# Patient Record
Sex: Female | Born: 1970
Health system: Southern US, Community
[De-identification: ages and names within clinical notes are randomized; demographics above are authoritative.]

## PROBLEM LIST (undated history)

## (undated) DIAGNOSIS — R413 Other amnesia: Secondary | ICD-10-CM

## (undated) DIAGNOSIS — G43909 Migraine, unspecified, not intractable, without status migrainosus: Secondary | ICD-10-CM

## (undated) DIAGNOSIS — R519 Headache, unspecified: Secondary | ICD-10-CM

## (undated) DIAGNOSIS — I10 Essential (primary) hypertension: Secondary | ICD-10-CM

## (undated) DIAGNOSIS — R51 Headache: Secondary | ICD-10-CM

## (undated) DIAGNOSIS — J189 Pneumonia, unspecified organism: Secondary | ICD-10-CM

## (undated) DIAGNOSIS — E7800 Pure hypercholesterolemia, unspecified: Secondary | ICD-10-CM

## (undated) DIAGNOSIS — D573 Sickle-cell trait: Secondary | ICD-10-CM

## (undated) DIAGNOSIS — I639 Cerebral infarction, unspecified: Secondary | ICD-10-CM

## (undated) DIAGNOSIS — N189 Chronic kidney disease, unspecified: Secondary | ICD-10-CM

## (undated) DIAGNOSIS — D649 Anemia, unspecified: Secondary | ICD-10-CM

## (undated) DIAGNOSIS — R319 Hematuria, unspecified: Secondary | ICD-10-CM

## (undated) DIAGNOSIS — Z9289 Personal history of other medical treatment: Secondary | ICD-10-CM

## (undated) DIAGNOSIS — E119 Type 2 diabetes mellitus without complications: Secondary | ICD-10-CM

## (undated) HISTORY — PX: TUBAL LIGATION: SHX77

## (undated) HISTORY — DX: Chronic kidney disease, unspecified: N18.9

---

## 2008-09-20 DIAGNOSIS — I639 Cerebral infarction, unspecified: Secondary | ICD-10-CM

## 2008-09-20 HISTORY — DX: Cerebral infarction, unspecified: I63.9

## 2014-03-21 ENCOUNTER — Emergency Department (HOSPITAL_BASED_OUTPATIENT_CLINIC_OR_DEPARTMENT_OTHER)
Admission: EM | Admit: 2014-03-21 | Discharge: 2014-03-22 | Disposition: A | Discharge status: Home / Self Care | Payer: Medicare PPO | Attending: Emergency Medicine | Admitting: Emergency Medicine

## 2014-03-21 ENCOUNTER — Encounter (HOSPITAL_BASED_OUTPATIENT_CLINIC_OR_DEPARTMENT_OTHER): Payer: Self-pay | Admitting: Emergency Medicine

## 2014-03-21 DIAGNOSIS — I10 Essential (primary) hypertension: Secondary | ICD-10-CM | POA: Insufficient documentation

## 2014-03-21 DIAGNOSIS — Z791 Long term (current) use of non-steroidal anti-inflammatories (NSAID): Secondary | ICD-10-CM | POA: Insufficient documentation

## 2014-03-21 DIAGNOSIS — Z8673 Personal history of transient ischemic attack (TIA), and cerebral infarction without residual deficits: Secondary | ICD-10-CM | POA: Insufficient documentation

## 2014-03-21 DIAGNOSIS — Z79899 Other long term (current) drug therapy: Secondary | ICD-10-CM | POA: Insufficient documentation

## 2014-03-21 DIAGNOSIS — N289 Disorder of kidney and ureter, unspecified: Secondary | ICD-10-CM | POA: Insufficient documentation

## 2014-03-21 DIAGNOSIS — R51 Headache: Secondary | ICD-10-CM | POA: Insufficient documentation

## 2014-03-21 DIAGNOSIS — E119 Type 2 diabetes mellitus without complications: Secondary | ICD-10-CM | POA: Insufficient documentation

## 2014-03-21 DIAGNOSIS — R519 Headache, unspecified: Secondary | ICD-10-CM

## 2014-03-21 HISTORY — DX: Cerebral infarction, unspecified: I63.9

## 2014-03-21 HISTORY — DX: Headache: R51

## 2014-03-21 HISTORY — DX: Essential (primary) hypertension: I10

## 2014-03-21 HISTORY — DX: Headache, unspecified: R51.9

## 2014-03-21 LAB — BASIC METABOLIC PANEL
Anion gap: 16 — ABNORMAL HIGH (ref 5–15)
BUN: 29 mg/dL — ABNORMAL HIGH (ref 6–23)
CO2: 25 mEq/L (ref 19–32)
Calcium: 9.8 mg/dL (ref 8.4–10.5)
Chloride: 99 mEq/L (ref 96–112)
Creatinine, Ser: 1.9 mg/dL — ABNORMAL HIGH (ref 0.50–1.10)
GFR calc Af Amer: 36 mL/min — ABNORMAL LOW (ref >90)
GFR calc non Af Amer: 31 mL/min — ABNORMAL LOW (ref >90)
Glucose, Bld: 82 mg/dL (ref 70–99)
Potassium: 3.3 mEq/L — ABNORMAL LOW (ref 3.7–5.3)
Sodium: 140 mEq/L (ref 137–147)

## 2014-03-21 MED ORDER — CLONIDINE HCL 0.1 MG PO TABS
0.1000 mg | ORAL_TABLET | Freq: Once | ORAL | Status: AC
  Administered 2014-03-21: 0.1 mg via ORAL
  Filled 2014-03-21: qty 1

## 2014-03-21 MED ORDER — HYDROCODONE-ACETAMINOPHEN 5-325 MG PO TABS
2.0000 | ORAL_TABLET | Freq: Once | ORAL | Status: AC
  Administered 2014-03-21: 2 via ORAL
  Filled 2014-03-21: qty 2

## 2014-03-21 MED ORDER — KETOROLAC TROMETHAMINE 30 MG/ML IJ SOLN
30.0000 mg | Freq: Once | INTRAMUSCULAR | Status: AC
  Administered 2014-03-21: 30 mg via INTRAVENOUS
  Filled 2014-03-21: qty 1

## 2014-03-21 MED ORDER — SODIUM CHLORIDE 0.9 % IV BOLUS (SEPSIS)
1000.0000 mL | Freq: Once | INTRAVENOUS | Status: AC
  Administered 2014-03-21: 1000 mL via INTRAVENOUS

## 2014-03-21 MED ORDER — DIPHENHYDRAMINE HCL 50 MG/ML IJ SOLN
12.5000 mg | Freq: Once | INTRAMUSCULAR | Status: AC
  Administered 2014-03-21: 12.5 mg via INTRAVENOUS
  Filled 2014-03-21: qty 1

## 2014-03-21 MED ORDER — METOCLOPRAMIDE HCL 5 MG/ML IJ SOLN
10.0000 mg | Freq: Once | INTRAMUSCULAR | Status: AC
  Administered 2014-03-21: 10 mg via INTRAVENOUS
  Filled 2014-03-21: qty 2

## 2014-03-21 NOTE — ED Provider Notes (Signed)
CSN: [id number]     Arrival date & time 03/21/14  1542 History   First MD Initiated Contact with Patient 03/21/14 1601     Chief Complaint  Patient presents with  . Headache     (Consider location/radiation/quality/duration/timing/severity/associated sxs/prior Treatment) HPI Comments: Pt states that she has an ongoing history of headache with htn. Pt states that this has been ongoing for a couple of years. Pt states that her pcp has been altering her bp medication to help with the headaches. Pt states that he was giving her narcotics for the headache, but has stopped given her narcotics. Pt denies fever, numbness, weakness, blurred vision, neck pain. She tried tylenol at home without relief. No n/v.  The history is provided by the patient. No language interpreter was used.    Past Medical History  Diagnosis Date  . Frequent headaches   . Stroke   . Hypertension   . Diabetes mellitus without complication    Past Surgical History  Procedure Laterality Date  . Cesarean section     No family history on file. History  Substance Use Topics  . Smoking status: Never Smoker   . Smokeless tobacco: Not on file  . Alcohol Use: No   OB History   Grav Para Term Preterm Abortions TAB SAB Ect Mult Living                 Review of Systems  Constitutional: Negative.   Respiratory: Negative.   Cardiovascular: Negative.       Allergies  Levaquin  Home Medications   Prior to Admission medications   Medication Sig Start Date End Date Taking? Authorizing Provider  benazepril (LOTENSIN) 20 MG tablet Take 20 mg by mouth daily.   Yes Historical Provider, MD  chlorthalidone (HYGROTON) 25 MG tablet Take 25 mg by mouth daily.   Yes Historical Provider, MD  diclofenac (CATAFLAM) 50 MG tablet Take 50 mg by mouth 3 (three) times daily.   Yes Historical Provider, MD  labetalol (NORMODYNE) 100 MG tablet Take 100 mg by mouth 2 (two) times daily.   Yes Historical Provider, MD  metFORMIN  (GLUCOPHAGE) 500 MG tablet Take by mouth 2 (two) times daily with a meal.   Yes Historical Provider, MD   BP 204/105  Pulse 81  Temp(Src) 98.1 F (36.7 C) (Oral)  Resp 20  Ht 5\' 2"  (1.575 m)  Wt 155 lb (70.308 kg)  BMI 28.34 kg/m2  SpO2 99%  LMP 03/06/2014 Physical Exam  Nursing note and vitals reviewed. Constitutional: She is oriented to person, place, and time. She appears well-developed and well-nourished.  HENT:  Head: Normocephalic and atraumatic.  Eyes: Conjunctivae and EOM are normal. Pupils are equal, round, and reactive to light.  Neck: Normal range of motion. Neck supple.  Cardiovascular: Normal rate and regular rhythm.   Abdominal: Soft. Bowel sounds are normal.  Musculoskeletal: Normal range of motion.  Grip mildly less on the left consistent with previous stroke  Neurological: She is alert and oriented to person, place, and time. She exhibits normal muscle tone. Coordination normal.  Skin: Skin is warm and dry.    ED Course  Procedures (including critical care time) Labs Review Labs Reviewed  BASIC METABOLIC PANEL - Abnormal; Notable for the following:    Potassium 3.3 (*)    BUN 29 (*)    Creatinine, Ser 1.90 (*)    GFR calc non Af Amer 31 (*)    GFR calc Af Amer 36 (*)  Anion gap 16 (*)    All other components within normal limits    Imaging Review No results found.   EKG Interpretation   Date/Time:  Thursday March 21 2014 16:14:45 EDT Ventricular Rate:  73 PR Interval:  156 QRS Duration: 94 QT Interval:  416 QTC Calculation: 458 R Axis:   45 Text Interpretation:  Normal sinus rhythm Nonspecific T wave abnormality  Abnormal ECG No old tracing to compare Confirmed by [facility] [place]  MD, TREY  ([id number]) on 03/21/2014 4:45:16 PM      MDM   Final diagnoses:  Essential hypertension  Headache, unspecified headache type  Renal insufficiency    Daughter states that this has been her cr level in the past and it has been going up and down over the last  year. Pt is going to have it rechecked. Pt is neurologically intact and can follow up with pcp. Pt is feeling better at this time and blood pressure is better. Discussed not sending home on narcotics and she can follow up with pcp    Glendell Docker, NP 03/21/14 2138

## 2014-03-21 NOTE — ED Notes (Signed)
NP made aware of Pt. Reports she is still in pain 8/10.

## 2014-03-21 NOTE — ED Notes (Signed)
Headaches for 2 days.

## 2014-03-21 NOTE — ED Notes (Signed)
Family at bedside. 

## 2014-03-21 NOTE — ED Notes (Signed)
Pt. Walks steady gait to the restroom with no difficulty.  Pt. Is alert and oriented with no distress noted.

## 2014-03-21 NOTE — Discharge Instructions (Signed)
Hypertension Hypertension, commonly called high blood pressure, is when the force of blood pumping through your arteries is too strong. Your arteries are the blood vessels that carry blood from your heart throughout your body. A blood pressure reading consists of a higher number over a lower number, such as 110/72. The higher number (systolic) is the pressure inside your arteries when your heart pumps. The lower number (diastolic) is the pressure inside your arteries when your heart relaxes. Ideally you want your blood pressure below 120/80. Hypertension forces your heart to work harder to pump blood. Your arteries may become narrow or stiff. Having hypertension puts you at risk for heart disease, stroke, and other problems.  RISK FACTORS Some risk factors for high blood pressure are controllable. Others are not.  Risk factors you cannot control include:   Race. You may be at higher risk if you are African American.  Age. Risk increases with age.  Gender. Men are at higher risk than women before age 45 years. After age 65, women are at higher risk than men. Risk factors you can control include:  Not getting enough exercise or physical activity.  Being overweight.  Getting too much fat, sugar, calories, or salt in your diet.  Drinking too much alcohol. SIGNS AND SYMPTOMS Hypertension does not usually cause signs or symptoms. Extremely high blood pressure (hypertensive crisis) may cause headache, anxiety, shortness of breath, and nosebleed. DIAGNOSIS  To check if you have hypertension, your health care provider will measure your blood pressure while you are seated, with your arm held at the level of your heart. It should be measured at least twice using the same arm. Certain conditions can cause a difference in blood pressure between your right and left arms. A blood pressure reading that is higher than normal on one occasion does not mean that you need treatment. If one blood pressure reading  is high, ask your health care provider about having it checked again. TREATMENT  Treating high blood pressure includes making lifestyle changes and possibly taking medication. Living a healthy lifestyle can help lower high blood pressure. You may need to change some of your habits. Lifestyle changes may include:  Following the DASH diet. This diet is high in fruits, vegetables, and whole grains. It is low in salt, red meat, and added sugars.  Getting at least 2 1/2 hours of brisk physical activity every week.  Losing weight if necessary.  Not smoking.  Limiting alcoholic beverages.  Learning ways to reduce stress. If lifestyle changes are not enough to get your blood pressure under control, your health care provider may prescribe medicine. You may need to take more than one. Work closely with your health care provider to understand the risks and benefits. HOME CARE INSTRUCTIONS  Have your blood pressure rechecked as directed by your health care provider.   Only take medicine as directed by your health care provider. Follow the directions carefully. Blood pressure medicines must be taken as prescribed. The medicine does not work as well when you skip doses. Skipping doses also puts you at risk for problems.   Do not smoke.   Monitor your blood pressure at home as directed by your health care provider. SEEK MEDICAL CARE IF:   You think you are having a reaction to medicines taken.  You have recurrent headaches or feel dizzy.  You have swelling in your ankles.  You have trouble with your vision. SEEK IMMEDIATE MEDICAL CARE IF:  You develop a severe headache or   confusion.  You have unusual weakness, numbness, or feel faint.  You have severe chest or abdominal pain.  You vomit repeatedly.  You have trouble breathing. MAKE SURE YOU:   Understand these instructions.  Will watch your condition.  Will get help right away if you are not doing well or get  worse. Document Released: 09/06/2005 Document Revised: 09/11/2013 Document Reviewed: 06/29/2013 [employer] Patient Information 2015 [employer]. This information is not intended to replace advice given to you by your health care provider. Make sure you discuss any questions you have with your health care provider.  

## 2014-03-22 NOTE — ED Provider Notes (Signed)
Medical screening examination/treatment/procedure(s) were performed by non-physician practitioner and as supervising physician I was immediately available for consultation/collaboration.   EKG Interpretation   Date/Time:  Thursday March 21 2014 16:14:45 EDT Ventricular Rate:  73 PR Interval:  156 QRS Duration: 94 QT Interval:  416 QTC Calculation: 458 R Axis:   45 Text Interpretation:  Normal sinus rhythm Nonspecific T wave abnormality  Abnormal ECG No old tracing to compare Confirmed by [facility]  MD, TREY  [phone]) on 03/21/2014 4:45:16 PM        Julie Reichmann Elba Barman III, MD 03/22/14 [phone]

## 2014-05-19 ENCOUNTER — Encounter (HOSPITAL_COMMUNITY): Payer: Self-pay | Admitting: Emergency Medicine

## 2014-05-19 ENCOUNTER — Emergency Department (HOSPITAL_COMMUNITY)
Admission: EM | Admit: 2014-05-19 | Discharge: 2014-05-19 | Disposition: A | Discharge status: Home / Self Care | Payer: Medicare PPO | Attending: Emergency Medicine | Admitting: Emergency Medicine

## 2014-05-19 DIAGNOSIS — G43809 Other migraine, not intractable, without status migrainosus: Secondary | ICD-10-CM

## 2014-05-19 DIAGNOSIS — I10 Essential (primary) hypertension: Secondary | ICD-10-CM | POA: Diagnosis not present

## 2014-05-19 DIAGNOSIS — Z79899 Other long term (current) drug therapy: Secondary | ICD-10-CM | POA: Insufficient documentation

## 2014-05-19 DIAGNOSIS — G43909 Migraine, unspecified, not intractable, without status migrainosus: Secondary | ICD-10-CM | POA: Insufficient documentation

## 2014-05-19 DIAGNOSIS — Z862 Personal history of diseases of the blood and blood-forming organs and certain disorders involving the immune mechanism: Secondary | ICD-10-CM | POA: Diagnosis not present

## 2014-05-19 DIAGNOSIS — E119 Type 2 diabetes mellitus without complications: Secondary | ICD-10-CM | POA: Insufficient documentation

## 2014-05-19 HISTORY — DX: Migraine, unspecified, not intractable, without status migrainosus: G43.909

## 2014-05-19 MED ORDER — BUPIVACAINE HCL 0.25 % IJ SOLN
10.0000 mL | Freq: Once | INTRAMUSCULAR | Status: DC
  Filled 2014-05-19: qty 10

## 2014-05-19 MED ORDER — DIPHENHYDRAMINE HCL 50 MG/ML IJ SOLN
25.0000 mg | Freq: Once | INTRAMUSCULAR | Status: AC
  Administered 2014-05-19: 25 mg via INTRAMUSCULAR
  Filled 2014-05-19: qty 1

## 2014-05-19 MED ORDER — BUTALBITAL-APAP-CAFFEINE 50-325-40 MG PO TABS
2.0000 | ORAL_TABLET | Freq: Once | ORAL | Status: AC
  Administered 2014-05-19: 2 via ORAL
  Filled 2014-05-19: qty 2

## 2014-05-19 MED ORDER — BUPIVACAINE HCL 0.25 % IJ SOLN
30.0000 mL | Freq: Once | INTRAMUSCULAR | Status: DC
  Filled 2014-05-19: qty 30

## 2014-05-19 MED ORDER — METOCLOPRAMIDE HCL 5 MG/ML IJ SOLN
10.0000 mg | Freq: Once | INTRAMUSCULAR | Status: AC
  Administered 2014-05-19: 10 mg via INTRAMUSCULAR
  Filled 2014-05-19: qty 2

## 2014-05-19 MED ORDER — KETOROLAC TROMETHAMINE 30 MG/ML IJ SOLN
60.0000 mg | Freq: Once | INTRAMUSCULAR | Status: AC
  Administered 2014-05-19: 60 mg via INTRAMUSCULAR
  Filled 2014-05-19: qty 2

## 2014-05-19 NOTE — Discharge Instructions (Signed)
Please follow up with your primary care physician in 1-2 days. If you do not have one please call the [facility] number listed above. Please follow up with your neurologist to schedule a follow up appointment.  Please read all discharge instructions and return precautions.    Migraine Headache A migraine headache is an intense, throbbing pain on one or both sides of your head. A migraine can last for 30 minutes to several hours. CAUSES  The exact cause of a migraine headache is not always known. However, a migraine may be caused when nerves in the brain become irritated and release chemicals that cause inflammation. This causes pain. Certain things may also trigger migraines, such as:  Alcohol.  Smoking.  Stress.  Menstruation.  Aged cheeses.  Foods or drinks that contain nitrates, glutamate, aspartame, or tyramine.  Lack of sleep.  Chocolate.  Caffeine.  Hunger.  Physical exertion.  Fatigue.  Medicines used to treat chest pain (nitroglycerine), birth control pills, estrogen, and some blood pressure medicines. SIGNS AND SYMPTOMS  Pain on one or both sides of your head.  Pulsating or throbbing pain.  Severe pain that prevents daily activities.  Pain that is aggravated by any physical activity.  Nausea, vomiting, or both.  Dizziness.  Pain with exposure to bright lights, loud noises, or activity.  General sensitivity to bright lights, loud noises, or smells. Before you get a migraine, you may get warning signs that a migraine is coming (aura). An aura may include:  Seeing flashing lights.  Seeing bright spots, halos, or zigzag lines.  Having tunnel vision or blurred vision.  Having feelings of numbness or tingling.  Having trouble talking.  Having muscle weakness. DIAGNOSIS  A migraine headache is often diagnosed based on:  Symptoms.  Physical exam.  A CT scan or MRI of your head. These imaging tests cannot diagnose migraines,  but they can help rule out other causes of headaches. TREATMENT Medicines may be given for pain and nausea. Medicines can also be given to help prevent recurrent migraines.  HOME CARE INSTRUCTIONS  Only take over-the-counter or prescription medicines for pain or discomfort as directed by your health care provider. The use of long-term narcotics is not recommended.  Lie down in a dark, quiet room when you have a migraine.  Keep a journal to find out what may trigger your migraine headaches. For example, write down:  What you eat and drink.  How much sleep you get.  Any change to your diet or medicines.  Limit alcohol consumption.  Quit smoking if you smoke.  Get 7-9 hours of sleep, or as recommended by your health care provider.  Limit stress.  Keep lights dim if bright lights bother you and make your migraines worse. SEEK IMMEDIATE MEDICAL CARE IF:   Your migraine becomes severe.  You have a fever.  You have a stiff neck.  You have vision loss.  You have muscular weakness or loss of muscle control.  You start losing your balance or have trouble walking.  You feel faint or pass out.  You have severe symptoms that are different from your first symptoms. MAKE SURE YOU:   Understand these instructions.  Will watch your condition.  Will get help right away if you are not doing well or get worse. Document Released: 09/06/2005 Document Revised: 01/21/2014 Document Reviewed: 05/14/2013 [facility] Patient Information 2015 [place], [place]. This information is not intended to replace advice given to you by your health care provider. Make  sure you discuss any questions you have with your health care provider.

## 2014-05-19 NOTE — ED Provider Notes (Signed)
CSN: [id number]     Arrival date & time 05/19/14  1126 History   First MD Initiated Contact with Patient 05/19/14 1248     Chief Complaint  Patient presents with  . Migraine     (Consider location/radiation/quality/duration/timing/severity/associated sxs/prior Treatment) HPI Comments: Patient is a 43 year old female past medical history significant for migraines, hypertension, sickle cell anemia, DM presenting to the emergency department for 2 days of gradually worsening migraine headache with associated photophobia. Patient states she has chronic migraine headaches they're typically well managed with her Topamax and Imitrex, states the last 2 days she's had no relief from his prescription medications. She has tried Tylenol with no improvement. Patient states prior to being placed on Imitrex and Topamax she has had similar headaches to the one today. Denies any fevers, chills, neck stiffness, nausea, vomiting, visual disturbances, syncope.   Patient is a 43 y.o. female presenting with migraines.  Migraine Associated symptoms include headaches.    Past Medical History  Diagnosis Date  . Migraine   . Hypertension   . Sickle cell anemia   . Diabetes mellitus without complication    History reviewed. No pertinent past surgical history. History reviewed. No pertinent family history. History  Substance Use Topics  . Smoking status: Never Smoker   . Smokeless tobacco: Not on file  . Alcohol Use: No   OB History   Grav Para Term Preterm Abortions TAB SAB Ect Mult Living                 Review of Systems  Eyes: Positive for photophobia.  Neurological: Positive for headaches.  All other systems reviewed and are negative.     Allergies  Levaquin  Home Medications   Prior to Admission medications   Medication Sig Start Date End Date Taking? Authorizing Provider  acetaminophen (TYLENOL) 500 MG tablet Take 1,000 mg by mouth every 6 (six) hours as needed for mild pain or  headache.   Yes Historical Provider, MD  amLODipine (NORVASC) 10 MG tablet Take 10 mg by mouth daily.  05/10/14  Yes Historical Provider, MD  B Complex-C-Folic Acid (FOLBEE PLUS PO) Take 1 tablet by mouth daily.   Yes Historical Provider, MD  hydrALAZINE (APRESOLINE) 100 MG tablet Take 100 mg by mouth 2 (two) times daily.  05/10/14  Yes Historical Provider, MD  labetalol (NORMODYNE) 100 MG tablet Take 100 mg by mouth 2 (two) times daily.  04/22/14  Yes Historical Provider, MD  pravastatin (PRAVACHOL) 20 MG tablet Take 20 mg by mouth daily.  05/10/14  Yes Historical Provider, MD  SUMAtriptan (IMITREX) 25 MG tablet Take 25 mg by mouth every 2 (two) hours as needed for migraine.  05/16/14  Yes Historical Provider, MD  topiramate (TOPAMAX) 25 MG tablet Take 25 mg by mouth at bedtime.  05/15/14  Yes Historical Provider, MD   BP 147/79  Pulse 92  Temp(Src) 99 F (37.2 C) (Oral)  Resp 20  SpO2 100%  LMP 05/04/2014 Physical Exam  Nursing note and vitals reviewed. Constitutional: She is oriented to person, place, and time. She appears well-developed and well-nourished. No distress.  HENT:  Head: Normocephalic and atraumatic.  Right Ear: External ear normal.  Left Ear: External ear normal.  Nose: Nose normal.  Mouth/Throat: Oropharynx is clear and moist. No oropharyngeal exudate.  Eyes: Conjunctivae and EOM are normal. Pupils are equal, round, and reactive to light.  Neck: Normal range of motion. Neck supple.  Cardiovascular: Normal rate, regular rhythm, normal heart sounds and  intact distal pulses.   Pulmonary/Chest: Effort normal and breath sounds normal. No respiratory distress.  Abdominal: Soft. There is no tenderness.  Neurological: She is alert and oriented to person, place, and time. She has normal strength. No cranial nerve deficit. Gait normal. GCS eye subscore is 4. GCS verbal subscore is 5. GCS motor subscore is 6.  Sensation grossly intact.  No pronator drift.  Bilateral heel-knee-shin  intact.  Skin: Skin is warm and dry. She is not diaphoretic.    ED Course  Procedures (including critical care time) Medications  diphenhydrAMINE (BENADRYL) injection 25 mg (25 mg Intramuscular Given 05/19/14 1345)  metoCLOPramide (REGLAN) injection 10 mg (10 mg Intramuscular Given 05/19/14 1348)  ketorolac (TORADOL) 30 MG/ML injection 60 mg (60 mg Intramuscular Given 05/19/14 1526)  butalbital-acetaminophen-caffeine (FIORICET, ESGIC) 50-325-40 MG per tablet 2 tablet (2 tablets Oral Given 05/19/14 1531)    Labs Review Labs Reviewed - No data to display  Imaging Review No results found.   EKG Interpretation None      Patient declined Marcaine injections.   MDM   Final diagnoses:  Other migraine without status migrainosus, not intractable    Filed Vitals:   05/19/14 1500  BP: 147/79  Pulse: 92  Temp:   Resp:    Afebrile, NAD, non-toxic appearing, AAOx4.  Pt HA treated and improved while in ED.  Presentation is like pts typical HA and non concerning for [facility], ICH, Meningitis, or temporal arteritis. Pt is afebrile with no focal neuro deficits, nuchal rigidity, or change in vision. Pt is to follow up with PCP to discuss prophylactic medication. Pt verbalizes understanding and is agreeable with plan to dc. Patient is stable at time of discharge. Patient d/w with Dr. Reather Converse, agrees with plan.        Harlow Mares, PA-C 05/19/14 1637

## 2014-05-19 NOTE — ED Provider Notes (Signed)
Medical screening examination/treatment/procedure(s) were conducted as a shared visit with non-physician practitioner(s) or resident  and myself.  I personally evaluated the patient during the encounter and agree with the findings.   I have personally reviewed any xrays and/ or EKG's with the provider and I agree with interpretation.   Patient history of migraine/headaches presents with gradual onset similar headache past 2 days. Patient denies neck stiffness, fevers, neuro deficits. On exam patient well-appearing, neck supple full range of motion, normal neuro exam, 5+ strength in UE and LE with f/e at major joints. Sensation to palpation intact in UE and LE. CNs 2-12 grossly intact.  EOMFI.  PERRL.   Finger nose and coordination intact bilateral.   Visual fields intact to finger testing. Patient improved in ER. Followup outpatient discussed. Patient denies gas or, go exposure, I asked this because her daughter also had a headache in ER. Another family member does not have a headache and there not concerned for any exposures at this time.  Headache  Julie Clonts, MD 05/19/14 [phone]

## 2014-05-19 NOTE — ED Notes (Signed)
Pt reports having migraine x 2 days, hx of migraines but no relief with prescription meds. Denies n/v.

## 2014-05-20 ENCOUNTER — Encounter (HOSPITAL_BASED_OUTPATIENT_CLINIC_OR_DEPARTMENT_OTHER): Payer: Self-pay | Admitting: Emergency Medicine

## 2015-02-13 ENCOUNTER — Encounter (HOSPITAL_COMMUNITY): Payer: Self-pay | Admitting: Emergency Medicine

## 2015-02-13 ENCOUNTER — Emergency Department (HOSPITAL_COMMUNITY): Payer: Medicare PPO

## 2015-02-13 ENCOUNTER — Emergency Department (HOSPITAL_COMMUNITY)
Admission: EM | Admit: 2015-02-13 | Discharge: 2015-02-13 | Disposition: A | Discharge status: Home / Self Care | Payer: Medicare PPO | Attending: Emergency Medicine | Admitting: Emergency Medicine

## 2015-02-13 DIAGNOSIS — I10 Essential (primary) hypertension: Secondary | ICD-10-CM | POA: Insufficient documentation

## 2015-02-13 DIAGNOSIS — Z79899 Other long term (current) drug therapy: Secondary | ICD-10-CM | POA: Insufficient documentation

## 2015-02-13 DIAGNOSIS — R519 Headache, unspecified: Secondary | ICD-10-CM

## 2015-02-13 DIAGNOSIS — Z8673 Personal history of transient ischemic attack (TIA), and cerebral infarction without residual deficits: Secondary | ICD-10-CM | POA: Diagnosis not present

## 2015-02-13 DIAGNOSIS — E119 Type 2 diabetes mellitus without complications: Secondary | ICD-10-CM | POA: Diagnosis not present

## 2015-02-13 DIAGNOSIS — G43909 Migraine, unspecified, not intractable, without status migrainosus: Secondary | ICD-10-CM | POA: Insufficient documentation

## 2015-02-13 DIAGNOSIS — D649 Anemia, unspecified: Secondary | ICD-10-CM | POA: Insufficient documentation

## 2015-02-13 DIAGNOSIS — R51 Headache: Secondary | ICD-10-CM | POA: Diagnosis present

## 2015-02-13 LAB — URINE MICROSCOPIC-ADD ON

## 2015-02-13 LAB — BASIC METABOLIC PANEL
Anion gap: 10 (ref 5–15)
BUN: 25 mg/dL — ABNORMAL HIGH (ref 6–20)
CO2: 19 mmol/L — ABNORMAL LOW (ref 22–32)
Calcium: 9.1 mg/dL (ref 8.9–10.3)
Chloride: 112 mmol/L — ABNORMAL HIGH (ref 101–111)
Creatinine, Ser: 2.16 mg/dL — ABNORMAL HIGH (ref 0.44–1.00)
GFR calc Af Amer: 31 mL/min — ABNORMAL LOW (ref >60)
GFR calc non Af Amer: 26 mL/min — ABNORMAL LOW (ref >60)
Glucose, Bld: 92 mg/dL (ref 65–99)
Potassium: 3 mmol/L — ABNORMAL LOW (ref 3.5–5.1)
Sodium: 141 mmol/L (ref 135–145)

## 2015-02-13 LAB — URINALYSIS, ROUTINE W REFLEX MICROSCOPIC
Bilirubin Urine: NEGATIVE
Glucose, UA: NEGATIVE mg/dL
Hgb urine dipstick: NEGATIVE
Ketones, ur: NEGATIVE mg/dL
Leukocytes, UA: NEGATIVE
Nitrite: NEGATIVE
Protein, ur: 100 mg/dL — AB
Specific Gravity, Urine: 1.011 (ref 1.005–1.030)
Urobilinogen, UA: 0.2 mg/dL (ref 0.0–1.0)
pH: 6 (ref 5.0–8.0)

## 2015-02-13 LAB — CBC WITH DIFFERENTIAL/PLATELET
Basophils Absolute: 0.1 10*3/uL (ref 0.0–0.1)
Basophils Relative: 0 % (ref 0–1)
Eosinophils Absolute: 0.4 10*3/uL (ref 0.0–0.7)
Eosinophils Relative: 3 % (ref 0–5)
HCT: 23.9 % — ABNORMAL LOW (ref 36.0–46.0)
Hemoglobin: 8.6 g/dL — ABNORMAL LOW (ref 12.0–15.0)
Lymphocytes Relative: 32 % (ref 12–46)
Lymphs Abs: 4.6 10*3/uL — ABNORMAL HIGH (ref 0.7–4.0)
MCH: 27.7 pg (ref 26.0–34.0)
MCHC: 36 g/dL (ref 30.0–36.0)
MCV: 76.8 fL — ABNORMAL LOW (ref 78.0–100.0)
Monocytes Absolute: 0.8 10*3/uL (ref 0.1–1.0)
Monocytes Relative: 6 % (ref 3–12)
Neutro Abs: 8.5 10*3/uL — ABNORMAL HIGH (ref 1.7–7.7)
Neutrophils Relative %: 59 % (ref 43–77)
Platelets: 527 10*3/uL — ABNORMAL HIGH (ref 150–400)
RBC: 3.11 MIL/uL — ABNORMAL LOW (ref 3.87–5.11)
RDW: 16.1 % — ABNORMAL HIGH (ref 11.5–15.5)
WBC: 14.4 10*3/uL — ABNORMAL HIGH (ref 4.0–10.5)

## 2015-02-13 MED ORDER — PROCHLORPERAZINE EDISYLATE 5 MG/ML IJ SOLN
10.0000 mg | Freq: Once | INTRAMUSCULAR | Status: AC
  Administered 2015-02-13: 10 mg via INTRAVENOUS
  Filled 2015-02-13: qty 2

## 2015-02-13 MED ORDER — FENTANYL CITRATE (PF) 100 MCG/2ML IJ SOLN: 100.0000 ug | Freq: Once | INTRAMUSCULAR | Status: DC

## 2015-02-13 MED ORDER — FENTANYL CITRATE (PF) 100 MCG/2ML IJ SOLN
100.0000 ug | Freq: Once | INTRAMUSCULAR | Status: DC
  Administered 2015-02-13: 100 ug via INTRAVENOUS
  Filled 2015-02-13: qty 2

## 2015-02-13 MED ORDER — BUTALBITAL-APAP-CAFFEINE 50-325-40 MG PO TABS: 1.0000 | ORAL_TABLET | Freq: Four times a day (QID) | ORAL | Status: AC | PRN

## 2015-02-13 MED ORDER — MAGNESIUM SULFATE IN D5W 10-5 MG/ML-% IV SOLN
1.0000 g | Freq: Once | INTRAVENOUS | Status: AC
  Administered 2015-02-13: 1 g via INTRAVENOUS
  Filled 2015-02-13: qty 100

## 2015-02-13 MED ORDER — SODIUM CHLORIDE 0.9 % IV BOLUS (SEPSIS)
1000.0000 mL | Freq: Once | INTRAVENOUS | Status: AC
  Administered 2015-02-13: 1000 mL via INTRAVENOUS

## 2015-02-13 MED ORDER — DEXAMETHASONE SODIUM PHOSPHATE 10 MG/ML IJ SOLN
10.0000 mg | Freq: Once | INTRAMUSCULAR | Status: AC
  Administered 2015-02-13: 10 mg via INTRAVENOUS
  Filled 2015-02-13: qty 1

## 2015-02-13 MED ORDER — DIPHENHYDRAMINE HCL 50 MG/ML IJ SOLN
25.0000 mg | Freq: Once | INTRAMUSCULAR | Status: AC
  Administered 2015-02-13: 25 mg via INTRAVENOUS
  Filled 2015-02-13: qty 1

## 2015-02-13 MED ORDER — POTASSIUM CHLORIDE 10 MEQ/100ML IV SOLN
10.0000 meq | INTRAVENOUS | Status: AC
  Administered 2015-02-13 (×2): 10 meq via INTRAVENOUS
  Filled 2015-02-13 (×2): qty 100

## 2015-02-13 NOTE — ED Notes (Signed)
Pt. Is going to CT. 

## 2015-02-13 NOTE — Discharge Instructions (Signed)
Return here as needed.  Follow up with a primary care doctor °

## 2015-02-13 NOTE — ED Notes (Signed)
Pt states has a migraine, started 7 days ago- has not been able to make it go away,

## 2015-02-13 NOTE — ED Notes (Signed)
Pt verbalized understanding of d/c instructions and has no further questions.

## 2015-02-13 NOTE — ED Notes (Signed)
Pt. Is back in room. 

## 2015-02-13 NOTE — ED Provider Notes (Signed)
CSN: [id number]     Arrival date & time 02/13/15  1317 History   First MD Initiated Contact with Patient 02/13/15 1621     Chief Complaint  Patient presents with  . Headache     (Consider location/radiation/quality/duration/timing/severity/associated sxs/prior Treatment) HPI Patient presents to the emergency department with headache that started 7 days ago.  She states she has intermittent headache that is a squeezing type headache across her entire head.  The patient states she does have a history of chronic headaches and migraines.  The patient states this feels different than previous headaches states that she has some light sensitivity.  The patient denies chest pain, shortness of breath, nausea, vomiting, weakness, dizziness, or vision, back pain, neck pain, fever, cough, runny nose, sore throat, abdominal pain, diarrhea, near syncope or syncope.  The patient states that nothing is make her condition better.  She states she has taken over-the-counter medications without relief of her symptoms Past Medical History  Diagnosis Date  . Frequent headaches   . Stroke   . Migraine   . Hypertension   . Sickle cell anemia   . Diabetes mellitus without complication    Past Surgical History  Procedure Laterality Date  . Cesarean section     No family history on file. History  Substance Use Topics  . Smoking status: Never Smoker   . Smokeless tobacco: Not on file  . Alcohol Use: No   OB History    No data available     Review of Systems  All other systems negative except as documented in the HPI. All pertinent positives and negatives as reviewed in the HPI.  Allergies  Levaquin and Levaquin  Home Medications   Prior to Admission medications   Medication Sig Start Date End Date Taking? Authorizing Provider  acetaminophen (TYLENOL) 500 MG tablet Take 1,000 mg by mouth every 6 (six) hours as needed for mild pain or headache.   Yes Historical Provider, MD  amLODipine-benazepril  (LOTREL) 10-40 MG per capsule Take 1 capsule by mouth daily.   Yes Historical Provider, MD  hydrALAZINE (APRESOLINE) 100 MG tablet Take 100 mg by mouth 3 (three) times daily.  05/10/14  Yes Historical Provider, MD  iron polysaccharides (NIFEREX) 150 MG capsule Take 150 mg by mouth daily.   Yes Historical Provider, MD  labetalol (NORMODYNE) 100 MG tablet Take 100 mg by mouth 2 (two) times daily.   Yes Historical Provider, MD  pravastatin (PRAVACHOL) 20 MG tablet Take 20 mg by mouth at bedtime.  05/10/14  Yes Historical Provider, MD  topiramate (TOPAMAX) 25 MG tablet Take 25 mg by mouth 2 (two) times daily.  05/15/14  Yes Historical Provider, MD  butalbital-acetaminophen-caffeine (FIORICET) 50-325-40 MG per tablet Take 1-2 tablets by mouth every 6 (six) hours as needed for headache. 02/13/15 02/13/16  Dalia Heading, PA-C   BP 128/73 mmHg  Pulse 77  Temp(Src) 98.1 F (36.7 C) (Oral)  Resp 15  SpO2 96%  LMP 02/06/2015 (Approximate) Physical Exam  Constitutional: She is oriented to person, place, and time. She appears well-developed and well-nourished. No distress.  HENT:  Head: Normocephalic and atraumatic.  Mouth/Throat: Oropharynx is clear and moist.  Eyes: Pupils are equal, round, and reactive to light.  Neck: Normal range of motion.  Cardiovascular: Normal rate, regular rhythm and normal heart sounds.  Exam reveals no gallop and no friction rub.   No murmur heard. Pulmonary/Chest: Effort normal and breath sounds normal. No respiratory distress.  Musculoskeletal: She exhibits no  edema.  Neurological: She is alert and oriented to person, place, and time. She has normal reflexes. She exhibits normal muscle tone. Coordination normal.  Skin: Skin is warm and dry. No rash noted. No erythema.  Nursing note and vitals reviewed.   ED Course  Procedures (including critical care time) Labs Review Labs Reviewed  BASIC METABOLIC PANEL - Abnormal; Notable for the following:    Potassium 3.0 (*)     Chloride 112 (*)    CO2 19 (*)    BUN 25 (*)    Creatinine, Ser 2.16 (*)    GFR calc non Af Amer 26 (*)    GFR calc Af Amer 31 (*)    All other components within normal limits  CBC WITH DIFFERENTIAL/PLATELET - Abnormal; Notable for the following:    WBC 14.4 (*)    RBC 3.11 (*)    Hemoglobin 8.6 (*)    HCT 23.9 (*)    MCV 76.8 (*)    RDW 16.1 (*)    Platelets 527 (*)    Neutro Abs 8.5 (*)    Lymphs Abs 4.6 (*)    All other components within normal limits  URINALYSIS, ROUTINE W REFLEX MICROSCOPIC (NOT AT [facility]) - Abnormal; Notable for the following:    Protein, ur 100 (*)    All other components within normal limits  URINE MICROSCOPIC-ADD ON    Imaging Review No results found.    Patient is advised of the results and all questions were answered and she is advised to follow-up with her primary care doctor.  She does not have any neurological deficits noted on exam.  Patient agrees the plan   Dalia Heading, PA-C 02/14/15 0130  Tanna Furry, MD 02/21/15 1332

## 2015-02-13 NOTE — ED Notes (Signed)
Pt reports a squeezing headache all over that began aprox. 7 days ago.  Pt reports she has a hx of migraines but the pain is different.  Pt denies any n/v/d, photosensitivity or sensitivity to sounds.

## 2015-02-13 NOTE — ED Notes (Signed)
PA at the bedside.

## 2015-02-25 ENCOUNTER — Encounter: Payer: Self-pay | Admitting: Family Medicine

## 2015-02-25 ENCOUNTER — Ambulatory Visit (INDEPENDENT_AMBULATORY_CARE_PROVIDER_SITE_OTHER): Payer: Medicare Other | Admitting: Family Medicine

## 2015-02-25 VITALS — BP 162/83 | HR 74 | Temp 98.1°F | Resp 16 | Ht 62.0 in | Wt 162.0 lb

## 2015-02-25 DIAGNOSIS — Z862 Personal history of diseases of the blood and blood-forming organs and certain disorders involving the immune mechanism: Secondary | ICD-10-CM

## 2015-02-25 NOTE — Progress Notes (Signed)
Ms. Julie Wells presents for an appointment to establish care. Patient reports that she was told to make an appointment here due to her sickle cell trait by Dr. Benito Mccreedy at [facility] Notified [facility], there are no records of patient being told to make an appointment. Recommended that she notify her primary provider. She states that she has an appointment for 03/07/2015.  Dorena Dew, FNP

## 2016-04-25 ENCOUNTER — Emergency Department (HOSPITAL_BASED_OUTPATIENT_CLINIC_OR_DEPARTMENT_OTHER)
Admission: EM | Admit: 2016-04-25 | Discharge: 2016-04-25 | Disposition: A | Discharge status: Home / Self Care | Payer: Medicare PPO | Attending: Emergency Medicine | Admitting: Emergency Medicine

## 2016-04-25 ENCOUNTER — Encounter (HOSPITAL_BASED_OUTPATIENT_CLINIC_OR_DEPARTMENT_OTHER): Payer: Self-pay | Admitting: *Deleted

## 2016-04-25 DIAGNOSIS — E119 Type 2 diabetes mellitus without complications: Secondary | ICD-10-CM | POA: Insufficient documentation

## 2016-04-25 DIAGNOSIS — Z8673 Personal history of transient ischemic attack (TIA), and cerebral infarction without residual deficits: Secondary | ICD-10-CM | POA: Diagnosis not present

## 2016-04-25 DIAGNOSIS — E1122 Type 2 diabetes mellitus with diabetic chronic kidney disease: Secondary | ICD-10-CM | POA: Diagnosis not present

## 2016-04-25 DIAGNOSIS — Z79899 Other long term (current) drug therapy: Secondary | ICD-10-CM | POA: Insufficient documentation

## 2016-04-25 DIAGNOSIS — M79604 Pain in right leg: Secondary | ICD-10-CM

## 2016-04-25 DIAGNOSIS — D5700 Hb-SS disease with crisis, unspecified: Secondary | ICD-10-CM | POA: Diagnosis present

## 2016-04-25 DIAGNOSIS — I129 Hypertensive chronic kidney disease with stage 1 through stage 4 chronic kidney disease, or unspecified chronic kidney disease: Secondary | ICD-10-CM | POA: Insufficient documentation

## 2016-04-25 DIAGNOSIS — N189 Chronic kidney disease, unspecified: Secondary | ICD-10-CM | POA: Diagnosis not present

## 2016-04-25 HISTORY — DX: Sickle-cell trait: D57.3

## 2016-04-25 LAB — COMPREHENSIVE METABOLIC PANEL
ALT: 12 U/L — ABNORMAL LOW (ref 14–54)
AST: 14 U/L — ABNORMAL LOW (ref 15–41)
Albumin: 4 g/dL (ref 3.5–5.0)
Alkaline Phosphatase: 62 U/L (ref 38–126)
Anion gap: 11 (ref 5–15)
BUN: 38 mg/dL — ABNORMAL HIGH (ref 6–20)
CO2: 22 mmol/L (ref 22–32)
Calcium: 8.9 mg/dL (ref 8.9–10.3)
Chloride: 107 mmol/L (ref 101–111)
Creatinine, Ser: 3.18 mg/dL — ABNORMAL HIGH (ref 0.44–1.00)
GFR calc Af Amer: 19 mL/min — ABNORMAL LOW (ref >60)
GFR calc non Af Amer: 16 mL/min — ABNORMAL LOW (ref >60)
Glucose, Bld: 90 mg/dL (ref 65–99)
Potassium: 3 mmol/L — ABNORMAL LOW (ref 3.5–5.1)
Sodium: 140 mmol/L (ref 135–145)
Total Bilirubin: 0.6 mg/dL (ref 0.3–1.2)
Total Protein: 7.8 g/dL (ref 6.5–8.1)

## 2016-04-25 LAB — RETICULOCYTES
RBC.: 2.75 MIL/uL — ABNORMAL LOW (ref 3.87–5.11)
Retic Count, Absolute: 96.3 10*3/uL (ref 19.0–186.0)
Retic Ct Pct: 3.5 % — ABNORMAL HIGH (ref 0.4–3.1)

## 2016-04-25 LAB — CBC WITH DIFFERENTIAL/PLATELET
Basophils Absolute: 0 10*3/uL (ref 0.0–0.1)
Basophils Relative: 0 %
Eosinophils Absolute: 0.5 10*3/uL (ref 0.0–0.7)
Eosinophils Relative: 3 %
HCT: 22 % — ABNORMAL LOW (ref 36.0–46.0)
Hemoglobin: 7.8 g/dL — ABNORMAL LOW (ref 12.0–15.0)
Lymphocytes Relative: 36 %
Lymphs Abs: 6 10*3/uL — ABNORMAL HIGH (ref 0.7–4.0)
MCH: 28.9 pg (ref 26.0–34.0)
MCHC: 35.5 g/dL (ref 30.0–36.0)
MCV: 81.5 fL (ref 78.0–100.0)
Monocytes Absolute: 0.8 10*3/uL (ref 0.1–1.0)
Monocytes Relative: 5 %
Neutro Abs: 9.4 10*3/uL — ABNORMAL HIGH (ref 1.7–7.7)
Neutrophils Relative %: 56 %
Platelets: 492 10*3/uL — ABNORMAL HIGH (ref 150–400)
RBC: 2.7 MIL/uL — ABNORMAL LOW (ref 3.87–5.11)
RDW: 16 % — ABNORMAL HIGH (ref 11.5–15.5)
WBC: 16.7 10*3/uL — ABNORMAL HIGH (ref 4.0–10.5)

## 2016-04-25 LAB — PREGNANCY, URINE: Preg Test, Ur: NEGATIVE

## 2016-04-25 MED ORDER — KETOROLAC TROMETHAMINE 30 MG/ML IJ SOLN
30.0000 mg | Freq: Once | INTRAMUSCULAR | Status: DC
  Filled 2016-04-25: qty 1

## 2016-04-25 MED ORDER — HYDROMORPHONE HCL 1 MG/ML IJ SOLN
1.0000 mg | Freq: Once | INTRAMUSCULAR | Status: AC
  Administered 2016-04-25: 1 mg via INTRAVENOUS
  Filled 2016-04-25: qty 1

## 2016-04-25 NOTE — ED Notes (Signed)
Pt's husband at bedside now. He states pt has a hx of sickle cell trait and not sickle cell anemia. Pt is already scheduled to follow up with PCP tomorrow.

## 2016-04-25 NOTE — Discharge Instructions (Signed)
Please call your primary care doctor tomorrow to set up close follow-up appointment. Your creatinine is 3.18 today (2.18 one year ago) and you will need to follow-up with your kidney doctor again for work-up. Also talk to your PCP about further pain control.  Return without fail for worsening symptoms, including fever, difficulty breathing, chest pain, diminished urine output, increased swelling or any other symptoms concerning to you.

## 2016-04-25 NOTE — ED Notes (Signed)
MD at bedside. 

## 2016-04-25 NOTE — ED Provider Notes (Signed)
[facility] Provider Note   CSN: [id number] Arrival date & time: 04/25/16  1735  First Provider Contact:   First MD Initiated Contact with Patient 04/25/16 1905    By signing my name below, I, Dyke Brackett, attest that this documentation has been prepared under the direction and in the presence of No att. providers found . Electronically Signed: Dyke Brackett, Scribe. 04/25/2016. 7:23 PM.  History   Chief Complaint Chief Complaint  Patient presents with  . Sickle Cell Pain Crisis    HPI Julie Wells is a 45 y.o. female with PMHx of sickle cell anemia (reported Sanders type), HTN, DNM, and stroke who presents to the Emergency Department complaining of 10/10, aching right leg pain onset last night. No alleviating or modifying factors noted. Pt states the pain is similar to previous sickle cell pain crises. She states she  typically has crises once a month, but they have become more frequent recently and occur 2-3 times per month. Per pt, her Sickle Cell is monitored by her PCP who will prescribe her oxycodone for the pain.  Pt denies any leg swelling, CP, SOB. She denies PMHx of blood clots in her legs. No recent travelling or trauma.   The history is provided by the patient. No language interpreter was used.    Past Medical History:  Diagnosis Date  . Diabetes mellitus without complication ([place])   . Frequent headaches   . Hypertension   . Migraine   . Sickle cell trait ([place])   . Stroke [facility])     There are no active problems to display for this patient.   Past Surgical History:  Procedure Laterality Date  . CESAREAN SECTION      OB History    No data available       Home Medications    Prior to Admission medications   Medication Sig Start Date End Date Taking? Authorizing Provider  acetaminophen (TYLENOL) 500 MG tablet Take 1,000 mg by mouth every 6 (six) hours as needed for mild pain or headache.   Yes Historical Provider, MD  amLODipine-benazepril (LOTREL)  10-40 MG per capsule Take 1 capsule by mouth daily.   Yes Historical Provider, MD  hydrALAZINE (APRESOLINE) 100 MG tablet Take 100 mg by mouth 3 (three) times daily.  05/10/14  Yes Historical Provider, MD  iron polysaccharides (NIFEREX) 150 MG capsule Take 150 mg by mouth daily.   Yes Historical Provider, MD  labetalol (NORMODYNE) 100 MG tablet Take 100 mg by mouth 2 (two) times daily.   Yes Historical Provider, MD  pravastatin (PRAVACHOL) 20 MG tablet Take 20 mg by mouth at bedtime.  05/10/14  Yes Historical Provider, MD  topiramate (TOPAMAX) 25 MG tablet Take 25 mg by mouth 2 (two) times daily.  05/15/14  Yes Historical Provider, MD    Family History No family history on file.  Social History Social History  Substance Use Topics  . Smoking status: Never Smoker  . Smokeless tobacco: Never Used  . Alcohol use No     Allergies   Levaquin [levofloxacin in d5w] and Levaquin [levofloxacin in d5w]   Review of Systems Review of Systems  Constitutional: Negative for fatigue.  Respiratory: Negative for shortness of breath.   Cardiovascular: Negative for chest pain and leg swelling.  Musculoskeletal: Positive for arthralgias and myalgias.  All other systems reviewed and are negative.    Physical Exam Updated Vital Signs BP 129/75 (BP Location: Left Arm)   Pulse 66   Temp  97.7 F (36.5 C) (Oral)   Resp 18   Ht 5\' 2"  (1.575 m)   Wt 158 lb (71.7 kg)   LMP 03/20/2016 (Exact Date)   SpO2 96%   BMI 28.90 kg/m   Physical Exam Physical Exam  Nursing note and vitals reviewed. Constitutional: Well developed, well nourished, non-toxic, and in no acute distress Head: Normocephalic and atraumatic.  Mouth/Throat: Oropharynx is clear and moist.  Neck: Normal range of motion. Neck supple.  Cardiovascular: Normal rate and regular rhythm.  +2 DP pulses bilaterally  Pulmonary/Chest: Effort normal and breath sounds normal.  Abdominal: Soft. There is no tenderness. There is no rebound and no  guarding.  Musculoskeletal: Normal range of motion. No swelling or deformity.  Neurological: Alert, no facial droop, fluent speech, moves all extremities symmetrically. Sensation to light touch intact in BLE.  Skin: Skin is warm and dry.  Psychiatric: Cooperative   ED Treatments / Results  DIAGNOSTIC STUDIES:  Oxygen Saturation is 96% on RA, adequate by my interpretation.    COORDINATION OF CARE:  7:10 PM Discussed treatment plan which includes dilaudid and Toradol with pt at bedside and pt agreed to plan.  Labs (all labs ordered are listed, but only abnormal results are displayed) Labs Reviewed  CBC WITH DIFFERENTIAL/PLATELET - Abnormal; Notable for the following:       Result Value   WBC 16.7 (*)    RBC 2.70 (*)    Hemoglobin 7.8 (*)    HCT 22.0 (*)    RDW 16.0 (*)    Platelets 492 (*)    Neutro Abs 9.4 (*)    Lymphs Abs 6.0 (*)    All other components within normal limits  COMPREHENSIVE METABOLIC PANEL - Abnormal; Notable for the following:    Potassium 3.0 (*)    BUN 38 (*)    Creatinine, Ser 3.18 (*)    AST 14 (*)    ALT 12 (*)    GFR calc non Af Amer 16 (*)    GFR calc Af Amer 19 (*)    All other components within normal limits  RETICULOCYTES - Abnormal; Notable for the following:    Retic Ct Pct 3.5 (*)    RBC. 2.75 (*)    All other components within normal limits  PREGNANCY, URINE    EKG  EKG Interpretation None       Radiology No results found.  Procedures Procedures (including critical care time)  Medications Ordered in ED Medications  HYDROmorphone (DILAUDID) injection 1 mg (1 mg Intravenous Given 04/25/16 1922)  HYDROmorphone (DILAUDID) injection 1 mg (1 mg Intravenous Given 04/25/16 2126)     Initial Impression / Assessment and Plan / ED Course  I have reviewed the triage vital signs and the nursing notes.  Pertinent labs & imaging results that were available during my care of the patient were reviewed by me and considered in my medical  decision making (see chart for details).  Clinical Course   Patient presenting with reported sickle cell pain crises that is typical for her usual crisis. Unusual that she only sees PCP for her sickle cell. She does have stable anemia on blood work, with elevated reticulocyte. Initially given dilaudid for pain pending work-up. On further re-evaluation when husband arrives, he states that she has sickle cell trait. States that she has been seen by hematology at [facility] and told that she had sickle cell trait and there was nothing to do.   Blood also c/f worsening creatinine 3.18 (from  2.18 one year ago). Her husband also states she has seen nephrologist a year ago for work-up and evaluation of her renal function. At this time, no signs of fluid overload. She has lower potassium. She will be able to see PCP this week for follow-up and to return to nephrologist for re-evaluation of her creatinine. I feel she is stable for evaluation of this closely as an outpatient.   The patient appears reasonably screened and/or stabilized for discharge and I doubt any other medical condition or other [facility] requiring further screening, evaluation, or treatment in the ED at this time prior to discharge. Strict return and follow-up instructions reviewed. She and husband expressed understanding of all discharge instructions and felt comfortable with the plan of care.   Final Clinical Impressions(s) / ED Diagnoses   Final diagnoses:  Right leg pain  Chronic kidney disease, unspecified stage   I personally performed the services described in this documentation, which was scribed in my presence. The recorded information has been reviewed and is accurate.  New Prescriptions Discharge Medication List as of 04/25/2016 10:30 PM       Forde Dandy, MD 04/26/16 [id number]

## 2016-04-25 NOTE — ED Triage Notes (Signed)
Patient states she has a 24 hour history of sickle cell pain.  States she had a similar crisis 2 weeks ago which resolved.  The pain in her right leg started last night.

## 2016-05-28 DIAGNOSIS — E785 Hyperlipidemia, unspecified: Secondary | ICD-10-CM | POA: Diagnosis not present

## 2016-05-28 DIAGNOSIS — I10 Essential (primary) hypertension: Secondary | ICD-10-CM | POA: Diagnosis not present

## 2016-05-28 DIAGNOSIS — E119 Type 2 diabetes mellitus without complications: Secondary | ICD-10-CM | POA: Diagnosis not present

## 2016-05-28 DIAGNOSIS — N183 Chronic kidney disease, stage 3 (moderate): Secondary | ICD-10-CM | POA: Diagnosis not present

## 2016-05-28 DIAGNOSIS — E876 Hypokalemia: Secondary | ICD-10-CM | POA: Diagnosis not present

## 2016-05-28 DIAGNOSIS — G43909 Migraine, unspecified, not intractable, without status migrainosus: Secondary | ICD-10-CM | POA: Diagnosis not present

## 2016-05-28 DIAGNOSIS — D5700 Hb-SS disease with crisis, unspecified: Secondary | ICD-10-CM | POA: Diagnosis not present

## 2016-05-28 DIAGNOSIS — G894 Chronic pain syndrome: Secondary | ICD-10-CM | POA: Diagnosis not present

## 2016-05-28 DIAGNOSIS — D72829 Elevated white blood cell count, unspecified: Secondary | ICD-10-CM | POA: Diagnosis not present

## 2016-06-03 DIAGNOSIS — I10 Essential (primary) hypertension: Secondary | ICD-10-CM | POA: Diagnosis not present

## 2016-06-03 DIAGNOSIS — E119 Type 2 diabetes mellitus without complications: Secondary | ICD-10-CM | POA: Diagnosis not present

## 2016-06-03 DIAGNOSIS — D72829 Elevated white blood cell count, unspecified: Secondary | ICD-10-CM | POA: Diagnosis not present

## 2016-06-03 DIAGNOSIS — N183 Chronic kidney disease, stage 3 (moderate): Secondary | ICD-10-CM | POA: Diagnosis not present

## 2016-06-03 DIAGNOSIS — G43909 Migraine, unspecified, not intractable, without status migrainosus: Secondary | ICD-10-CM | POA: Diagnosis not present

## 2016-06-03 DIAGNOSIS — D5700 Hb-SS disease with crisis, unspecified: Secondary | ICD-10-CM | POA: Diagnosis not present

## 2016-06-03 DIAGNOSIS — G894 Chronic pain syndrome: Secondary | ICD-10-CM | POA: Diagnosis not present

## 2016-06-03 DIAGNOSIS — E785 Hyperlipidemia, unspecified: Secondary | ICD-10-CM | POA: Diagnosis not present

## 2016-06-03 DIAGNOSIS — E876 Hypokalemia: Secondary | ICD-10-CM | POA: Diagnosis not present

## 2016-06-10 DIAGNOSIS — G43909 Migraine, unspecified, not intractable, without status migrainosus: Secondary | ICD-10-CM | POA: Diagnosis not present

## 2016-06-10 DIAGNOSIS — E876 Hypokalemia: Secondary | ICD-10-CM | POA: Diagnosis not present

## 2016-06-10 DIAGNOSIS — G894 Chronic pain syndrome: Secondary | ICD-10-CM | POA: Diagnosis not present

## 2016-06-10 DIAGNOSIS — E785 Hyperlipidemia, unspecified: Secondary | ICD-10-CM | POA: Diagnosis not present

## 2016-06-10 DIAGNOSIS — I10 Essential (primary) hypertension: Secondary | ICD-10-CM | POA: Diagnosis not present

## 2016-06-10 DIAGNOSIS — N183 Chronic kidney disease, stage 3 (moderate): Secondary | ICD-10-CM | POA: Diagnosis not present

## 2016-06-10 DIAGNOSIS — R319 Hematuria, unspecified: Secondary | ICD-10-CM | POA: Diagnosis not present

## 2016-06-10 DIAGNOSIS — N390 Urinary tract infection, site not specified: Secondary | ICD-10-CM | POA: Diagnosis not present

## 2016-06-10 DIAGNOSIS — E119 Type 2 diabetes mellitus without complications: Secondary | ICD-10-CM | POA: Diagnosis not present

## 2016-06-14 DIAGNOSIS — G43909 Migraine, unspecified, not intractable, without status migrainosus: Secondary | ICD-10-CM | POA: Diagnosis not present

## 2016-06-14 DIAGNOSIS — N183 Chronic kidney disease, stage 3 (moderate): Secondary | ICD-10-CM | POA: Diagnosis not present

## 2016-06-14 DIAGNOSIS — E785 Hyperlipidemia, unspecified: Secondary | ICD-10-CM | POA: Diagnosis not present

## 2016-06-14 DIAGNOSIS — E876 Hypokalemia: Secondary | ICD-10-CM | POA: Diagnosis not present

## 2016-06-14 DIAGNOSIS — N179 Acute kidney failure, unspecified: Secondary | ICD-10-CM | POA: Diagnosis not present

## 2016-06-14 DIAGNOSIS — E119 Type 2 diabetes mellitus without complications: Secondary | ICD-10-CM | POA: Diagnosis not present

## 2016-06-14 DIAGNOSIS — T7840XA Allergy, unspecified, initial encounter: Secondary | ICD-10-CM | POA: Diagnosis not present

## 2016-06-14 DIAGNOSIS — I10 Essential (primary) hypertension: Secondary | ICD-10-CM | POA: Diagnosis not present

## 2016-06-14 DIAGNOSIS — N390 Urinary tract infection, site not specified: Secondary | ICD-10-CM | POA: Diagnosis not present

## 2016-06-20 ENCOUNTER — Encounter (HOSPITAL_BASED_OUTPATIENT_CLINIC_OR_DEPARTMENT_OTHER): Payer: Self-pay | Admitting: *Deleted

## 2016-06-20 ENCOUNTER — Emergency Department (HOSPITAL_BASED_OUTPATIENT_CLINIC_OR_DEPARTMENT_OTHER): Payer: Medicare PPO

## 2016-06-20 ENCOUNTER — Inpatient Hospital Stay (HOSPITAL_BASED_OUTPATIENT_CLINIC_OR_DEPARTMENT_OTHER)
Admission: EM | Admit: 2016-06-20 | Discharge: 2016-06-24 | DRG: 812 | Disposition: A | Discharge status: Home / Self Care | Payer: Medicare PPO | Attending: Family Medicine | Admitting: Family Medicine

## 2016-06-20 ENCOUNTER — Inpatient Hospital Stay (HOSPITAL_COMMUNITY): Payer: Medicare PPO

## 2016-06-20 DIAGNOSIS — E1129 Type 2 diabetes mellitus with other diabetic kidney complication: Secondary | ICD-10-CM

## 2016-06-20 DIAGNOSIS — D649 Anemia, unspecified: Secondary | ICD-10-CM | POA: Insufficient documentation

## 2016-06-20 DIAGNOSIS — D7589 Other specified diseases of blood and blood-forming organs: Secondary | ICD-10-CM | POA: Diagnosis present

## 2016-06-20 DIAGNOSIS — E119 Type 2 diabetes mellitus without complications: Secondary | ICD-10-CM | POA: Diagnosis not present

## 2016-06-20 DIAGNOSIS — D473 Essential (hemorrhagic) thrombocythemia: Secondary | ICD-10-CM | POA: Diagnosis not present

## 2016-06-20 DIAGNOSIS — R319 Hematuria, unspecified: Secondary | ICD-10-CM | POA: Diagnosis not present

## 2016-06-20 DIAGNOSIS — N281 Cyst of kidney, acquired: Secondary | ICD-10-CM | POA: Diagnosis not present

## 2016-06-20 DIAGNOSIS — E876 Hypokalemia: Secondary | ICD-10-CM

## 2016-06-20 DIAGNOSIS — D62 Acute posthemorrhagic anemia: Principal | ICD-10-CM | POA: Diagnosis present

## 2016-06-20 DIAGNOSIS — N183 Chronic kidney disease, stage 3 unspecified: Secondary | ICD-10-CM | POA: Diagnosis present

## 2016-06-20 DIAGNOSIS — I69911 Memory deficit following unspecified cerebrovascular disease: Secondary | ICD-10-CM | POA: Diagnosis present

## 2016-06-20 DIAGNOSIS — D72829 Elevated white blood cell count, unspecified: Secondary | ICD-10-CM | POA: Diagnosis not present

## 2016-06-20 DIAGNOSIS — D573 Sickle-cell trait: Secondary | ICD-10-CM | POA: Diagnosis not present

## 2016-06-20 DIAGNOSIS — R310 Gross hematuria: Secondary | ICD-10-CM

## 2016-06-20 DIAGNOSIS — D638 Anemia in other chronic diseases classified elsewhere: Secondary | ICD-10-CM | POA: Diagnosis present

## 2016-06-20 DIAGNOSIS — D75839 Thrombocytosis, unspecified: Secondary | ICD-10-CM

## 2016-06-20 DIAGNOSIS — R938 Abnormal findings on diagnostic imaging of other specified body structures: Secondary | ICD-10-CM | POA: Diagnosis not present

## 2016-06-20 DIAGNOSIS — I129 Hypertensive chronic kidney disease with stage 1 through stage 4 chronic kidney disease, or unspecified chronic kidney disease: Secondary | ICD-10-CM | POA: Diagnosis present

## 2016-06-20 DIAGNOSIS — R9389 Abnormal findings on diagnostic imaging of other specified body structures: Secondary | ICD-10-CM | POA: Diagnosis present

## 2016-06-20 DIAGNOSIS — I10 Essential (primary) hypertension: Secondary | ICD-10-CM | POA: Diagnosis present

## 2016-06-20 DIAGNOSIS — N289 Disorder of kidney and ureter, unspecified: Secondary | ICD-10-CM

## 2016-06-20 DIAGNOSIS — D6489 Other specified anemias: Secondary | ICD-10-CM

## 2016-06-20 DIAGNOSIS — N390 Urinary tract infection, site not specified: Secondary | ICD-10-CM | POA: Diagnosis present

## 2016-06-20 DIAGNOSIS — N179 Acute kidney failure, unspecified: Secondary | ICD-10-CM

## 2016-06-20 DIAGNOSIS — E1122 Type 2 diabetes mellitus with diabetic chronic kidney disease: Secondary | ICD-10-CM | POA: Diagnosis present

## 2016-06-20 DIAGNOSIS — G43909 Migraine, unspecified, not intractable, without status migrainosus: Secondary | ICD-10-CM | POA: Diagnosis present

## 2016-06-20 HISTORY — DX: Hematuria, unspecified: R31.9

## 2016-06-20 HISTORY — DX: Pneumonia, unspecified organism: J18.9

## 2016-06-20 HISTORY — DX: Other amnesia: R41.3

## 2016-06-20 HISTORY — DX: Type 2 diabetes mellitus without complications: E11.9

## 2016-06-20 HISTORY — DX: Pure hypercholesterolemia, unspecified: E78.00

## 2016-06-20 LAB — URINALYSIS, ROUTINE W REFLEX MICROSCOPIC
Bilirubin Urine: NEGATIVE
Glucose, UA: NEGATIVE mg/dL
Ketones, ur: 15 mg/dL — AB
Nitrite: NEGATIVE
Protein, ur: 100 mg/dL — AB
Specific Gravity, Urine: 1.011 (ref 1.005–1.030)
pH: 5.5 (ref 5.0–8.0)

## 2016-06-20 LAB — GLUCOSE, CAPILLARY: Glucose-Capillary: 103 mg/dL — ABNORMAL HIGH (ref 65–99)

## 2016-06-20 LAB — HEPATIC FUNCTION PANEL
ALT: 12 U/L — ABNORMAL LOW (ref 14–54)
AST: 11 U/L — ABNORMAL LOW (ref 15–41)
Albumin: 3.5 g/dL (ref 3.5–5.0)
Alkaline Phosphatase: 71 U/L (ref 38–126)
Bilirubin, Direct: <0.1 mg/dL — ABNORMAL LOW (ref 0.1–0.5)
Total Bilirubin: 0.4 mg/dL (ref 0.3–1.2)
Total Protein: 8.7 g/dL — ABNORMAL HIGH (ref 6.5–8.1)

## 2016-06-20 LAB — BASIC METABOLIC PANEL
Anion gap: 12 (ref 5–15)
BUN: 63 mg/dL — ABNORMAL HIGH (ref 6–20)
CO2: 20 mmol/L — ABNORMAL LOW (ref 22–32)
Calcium: 9 mg/dL (ref 8.9–10.3)
Chloride: 108 mmol/L (ref 101–111)
Creatinine, Ser: 4.13 mg/dL — ABNORMAL HIGH (ref 0.44–1.00)
GFR calc Af Amer: 14 mL/min — ABNORMAL LOW (ref >60)
GFR calc non Af Amer: 12 mL/min — ABNORMAL LOW (ref >60)
Glucose, Bld: 93 mg/dL (ref 65–99)
Potassium: 2.6 mmol/L — CL (ref 3.5–5.1)
Sodium: 140 mmol/L (ref 135–145)

## 2016-06-20 LAB — IRON AND TIBC
Iron: 55 ug/dL (ref 28–170)
Saturation Ratios: 17 % (ref 10.4–31.8)
TIBC: 315 ug/dL (ref 250–450)
UIBC: 260 ug/dL

## 2016-06-20 LAB — CBC
HCT: 16 % — ABNORMAL LOW (ref 36.0–46.0)
Hemoglobin: 5.3 g/dL — CL (ref 12.0–15.0)
MCH: 26.8 pg (ref 26.0–34.0)
MCHC: 33.1 g/dL (ref 30.0–36.0)
MCV: 80.8 fL (ref 78.0–100.0)
Platelets: 1119 10*3/uL (ref 150–400)
RBC: 1.98 MIL/uL — ABNORMAL LOW (ref 3.87–5.11)
RDW: 18 % — ABNORMAL HIGH (ref 11.5–15.5)
WBC: 18 10*3/uL — ABNORMAL HIGH (ref 4.0–10.5)

## 2016-06-20 LAB — URINE MICROSCOPIC-ADD ON

## 2016-06-20 LAB — PREGNANCY, URINE: Preg Test, Ur: NEGATIVE

## 2016-06-20 LAB — VITAMIN B12: Vitamin B-12: 1035 pg/mL — ABNORMAL HIGH (ref 180–914)

## 2016-06-20 LAB — RETICULOCYTES
RBC.: 2.01 MIL/uL — ABNORMAL LOW (ref 3.87–5.11)
Retic Count, Absolute: 134.7 10*3/uL (ref 19.0–186.0)
Retic Ct Pct: 6.7 % — ABNORMAL HIGH (ref 0.4–3.1)

## 2016-06-20 LAB — APTT: aPTT: 42 seconds — ABNORMAL HIGH (ref 24–36)

## 2016-06-20 LAB — PROTIME-INR
INR: 1.2
Prothrombin Time: 15.3 seconds — ABNORMAL HIGH (ref 11.4–15.2)

## 2016-06-20 LAB — FOLATE: Folate: 30.9 ng/mL (ref >5.9)

## 2016-06-20 LAB — PREPARE RBC (CROSSMATCH)

## 2016-06-20 LAB — FERRITIN: Ferritin: 476 ng/mL — ABNORMAL HIGH (ref 11–307)

## 2016-06-20 LAB — PHOSPHORUS: Phosphorus: 5.4 mg/dL — ABNORMAL HIGH (ref 2.5–4.6)

## 2016-06-20 LAB — ABO/RH: ABO/RH(D): A POS

## 2016-06-20 LAB — MAGNESIUM: Magnesium: 2.4 mg/dL (ref 1.7–2.4)

## 2016-06-20 MED ORDER — SODIUM CHLORIDE 0.9 % IV SOLN: Freq: Once | INTRAVENOUS | Status: DC

## 2016-06-20 MED ORDER — POLYSACCHARIDE IRON COMPLEX 150 MG PO CAPS
150.0000 mg | ORAL_CAPSULE | Freq: Every day | ORAL | Status: DC
  Administered 2016-06-21 – 2016-06-24 (×4): 150 mg via ORAL
  Filled 2016-06-20 (×4): qty 1

## 2016-06-20 MED ORDER — ZOLPIDEM TARTRATE 5 MG PO TABS
5.0000 mg | ORAL_TABLET | Freq: Every evening | ORAL | Status: DC | PRN
  Filled 2016-06-20: qty 1

## 2016-06-20 MED ORDER — ACETAMINOPHEN 650 MG RE SUPP: 650.0000 mg | Freq: Four times a day (QID) | RECTAL | Status: DC | PRN

## 2016-06-20 MED ORDER — POTASSIUM CHLORIDE 10 MEQ/100ML IV SOLN
10.0000 meq | Freq: Once | INTRAVENOUS | Status: AC
  Administered 2016-06-20: 10 meq via INTRAVENOUS
  Filled 2016-06-20: qty 100

## 2016-06-20 MED ORDER — SODIUM CHLORIDE 0.9 % IV BOLUS (SEPSIS)
1000.0000 mL | Freq: Once | INTRAVENOUS | Status: AC
  Administered 2016-06-20: 1000 mL via INTRAVENOUS

## 2016-06-20 MED ORDER — HYDROCODONE-ACETAMINOPHEN 5-325 MG PO TABS
1.0000 | ORAL_TABLET | ORAL | Status: DC | PRN
  Administered 2016-06-21 – 2016-06-22 (×2): 1 via ORAL
  Administered 2016-06-23 (×2): 2 via ORAL
  Administered 2016-06-23: 1 via ORAL
  Administered 2016-06-23 – 2016-06-24 (×3): 2 via ORAL
  Filled 2016-06-20: qty 1
  Filled 2016-06-20 (×2): qty 2
  Filled 2016-06-20: qty 1
  Filled 2016-06-20 (×2): qty 2
  Filled 2016-06-20: qty 1
  Filled 2016-06-20 (×2): qty 2

## 2016-06-20 MED ORDER — ACETAMINOPHEN 325 MG PO TABS
650.0000 mg | ORAL_TABLET | Freq: Four times a day (QID) | ORAL | Status: DC | PRN
  Administered 2016-06-20: 650 mg via ORAL
  Filled 2016-06-20: qty 2

## 2016-06-20 MED ORDER — DEXTROSE 5 % IV SOLN
1.0000 g | INTRAVENOUS | Status: DC
  Administered 2016-06-20 – 2016-06-23 (×4): 1 g via INTRAVENOUS
  Filled 2016-06-20 (×5): qty 10

## 2016-06-20 MED ORDER — DEXTROSE 5 % IV SOLN
1.0000 g | INTRAVENOUS | Status: DC
  Filled 2016-06-20: qty 10

## 2016-06-20 MED ORDER — ONDANSETRON HCL 4 MG PO TABS: 4.0000 mg | ORAL_TABLET | Freq: Four times a day (QID) | ORAL | Status: DC | PRN

## 2016-06-20 MED ORDER — ONDANSETRON HCL 4 MG/2ML IJ SOLN: 4.0000 mg | Freq: Four times a day (QID) | INTRAMUSCULAR | Status: DC | PRN

## 2016-06-20 MED ORDER — SODIUM CHLORIDE 0.9% FLUSH
3.0000 mL | Freq: Two times a day (BID) | INTRAVENOUS | Status: DC
  Administered 2016-06-21 – 2016-06-22 (×4): 3 mL via INTRAVENOUS

## 2016-06-20 MED ORDER — INSULIN ASPART 100 UNIT/ML ~~LOC~~ SOLN: 0.0000 [IU] | Freq: Three times a day (TID) | SUBCUTANEOUS | Status: DC

## 2016-06-20 MED ORDER — LABETALOL HCL 100 MG PO TABS
100.0000 mg | ORAL_TABLET | Freq: Two times a day (BID) | ORAL | Status: DC
  Administered 2016-06-20 – 2016-06-24 (×7): 100 mg via ORAL
  Filled 2016-06-20 (×8): qty 1

## 2016-06-20 MED ORDER — POTASSIUM CHLORIDE CRYS ER 20 MEQ PO TBCR
40.0000 meq | EXTENDED_RELEASE_TABLET | Freq: Once | ORAL | Status: AC
Start: 2016-06-20 — End: 2016-06-20
  Administered 2016-06-20: 40 meq via ORAL
  Filled 2016-06-20: qty 2

## 2016-06-20 MED ORDER — PRAVASTATIN SODIUM 20 MG PO TABS
20.0000 mg | ORAL_TABLET | Freq: Every day | ORAL | Status: DC
  Administered 2016-06-20 – 2016-06-23 (×4): 20 mg via ORAL
  Filled 2016-06-20 (×4): qty 1

## 2016-06-20 MED ORDER — HYDRALAZINE HCL 20 MG/ML IJ SOLN
5.0000 mg | Freq: Four times a day (QID) | INTRAMUSCULAR | Status: DC | PRN
Start: 2016-06-20 — End: 2016-06-24
  Administered 2016-06-21: 5 mg via INTRAVENOUS
  Filled 2016-06-20: qty 1

## 2016-06-20 MED ORDER — SODIUM CHLORIDE 0.9 % IV SOLN
INTRAVENOUS | Status: AC
  Administered 2016-06-20: 18:00:00 via INTRAVENOUS

## 2016-06-20 MED ORDER — POTASSIUM CHLORIDE CRYS ER 20 MEQ PO TBCR
40.0000 meq | EXTENDED_RELEASE_TABLET | Freq: Once | ORAL | Status: AC
  Administered 2016-06-20: 40 meq via ORAL
  Filled 2016-06-20: qty 2

## 2016-06-20 NOTE — ED Notes (Signed)
Pt returned to room from toileting.  Denies any SOB or dizziness upon ambulation.  Alert and oriented. Comfortable at present.

## 2016-06-20 NOTE — ED Triage Notes (Signed)
Patient c/o intermittent R flank pain and blood in her urine. She states that she is currently on antibiotics for a UTI that wer prescribed by her primary MD

## 2016-06-20 NOTE — Progress Notes (Signed)
45 year old female past medical history significant for chronic kidney disease presents to the emergency room with chief complaint hematuria. Patient has known anemia. Baseline around 8. Hemoglobin emergency room 5.3. Patient also of right flank pain. CT scan shows abnormality. Of the kidney. MRI kidney advised. Outside freestanding ED does not have a blood transfusion patient is hemodynamically stable. Patient will need to be transfused once he arrived to the floor and will need to go for MRI. Coags ordered. Antibiotics to be started  Elwin Mocha MD

## 2016-06-20 NOTE — Progress Notes (Signed)
Dr. Aggie Moats text paged to notify of patient arrival.

## 2016-06-20 NOTE — H&P (Signed)
History and Physical    Julie Wells ZDG:[id number] DOB: November 22, 1970 DOA: 06/20/2016  PCP: Benito Mccreedy, MD Patient coming from: home  Chief Complaint: hematuria/right flank pain  HPI: Julie Wells is a very pleasant 45 y.o. female with medical history significant for diabetes diet controlled, hypertension, stroke 2010 with short-term memory deficits, migraine presents to 5 W. room 24 from [place] complaint of hematuria and flank pain. Initial evaluation includes CT revealing a lesion lower pole of right kidney, acute blood loss anemia, hypokalemia, leukocytosis, acute on chronic kidney disease.  Information is obtained from the patient and her family who is at the bedside as she has some short-term memory deficits from a stroke in 2010. About 2 weeks ago she was diagnosed with urinary tract infection by her PCP was started on Keflex. At that time PCP informed patient she had "scant" amounts of blood in her urine. She reports some sort of reaction to Keflex and stopped taking it. Over the last 2 days she developed large amounts of bright red blood in her urine as well as right flank pain. She denies dysuria frequency or urgency. She denies headache dizziness syncope or near-syncope. She denies shortness of breath chest pain palpitations lower extremity edema. She denies any fever chills nausea or vomiting. She denies any constipation diarrhea melena or bright red blood per rectum.    ED Course: In the emergency department she's afebrile hemodynamically stable and not hypoxic.  Review of Systems: As per HPI otherwise 10 point review of systems negative.   Ambulatory Status: He ambulates independently with a steady gait no recent falls  Past Medical History:  Diagnosis Date  . Diabetes mellitus without complication ([place])   . Frequent headaches   . Hypertension   . Migraine   . Sickle cell trait ([place])   . Stroke [facility])     Past Surgical History:  Procedure Laterality  Date  . CESAREAN SECTION      Social History   Social History  . Marital status: Married    Spouse name: N/A  . Number of children: N/A  . Years of education: N/A   Occupational History  . Not on file.   Social History Main Topics  . Smoking status: Never Smoker  . Smokeless tobacco: Never Used  . Alcohol use No  . Drug use: No  . Sexual activity: Not on file   Other Topics Concern  . Not on file   Social History Narrative   ** Merged History Encounter **        Allergies  Allergen Reactions  . Levaquin [Levofloxacin In D5w]     swelling  . Levaquin [Levofloxacin In D5w] Hives    No family history on file.  Prior to Admission medications   Medication Sig Start Date End Date Taking? Authorizing Provider  cephALEXin (KEFLEX) 500 MG capsule Take 500 mg by mouth 4 (four) times daily.   Yes Historical Provider, MD  ergocalciferol (VITAMIN D2) 50000 units capsule Take 50,000 Units by mouth once a week.   Yes Historical Provider, MD  HYDROCHLOROTHIAZIDE PO Take 25 mg by mouth daily.   Yes Historical Provider, MD  SUMAtriptan (IMITREX) 25 MG tablet Take 25 mg by mouth every 2 (two) hours as needed for migraine. May repeat in 2 hours if headache persists or recurs.   Yes Historical Provider, MD  acetaminophen (TYLENOL) 500 MG tablet Take 1,000 mg by mouth every 6 (six) hours as needed for mild pain or headache.  Historical Provider, MD  amLODipine-benazepril (LOTREL) 10-40 MG per capsule Take 1 capsule by mouth daily.    Historical Provider, MD  hydrALAZINE (APRESOLINE) 100 MG tablet Take 100 mg by mouth 3 (three) times daily.  05/10/14   Historical Provider, MD  iron polysaccharides (NIFEREX) 150 MG capsule Take 150 mg by mouth daily.    Historical Provider, MD  labetalol (NORMODYNE) 100 MG tablet Take 100 mg by mouth 2 (two) times daily.    Historical Provider, MD  pravastatin (PRAVACHOL) 20 MG tablet Take 20 mg by mouth at bedtime.  05/10/14   Historical Provider, MD    topiramate (TOPAMAX) 25 MG tablet Take 25 mg by mouth 2 (two) times daily.  05/15/14   Historical Provider, MD    Physical Exam: Vitals:   06/20/16 1330 06/20/16 1405 06/20/16 1509 06/20/16 1628  BP: 130/75 134/83 121/71 132/72  Pulse: 71 75 76 82  Resp: 15 16 15 18   Temp:    98.1 F (36.7 C)  TempSrc:    Oral  SpO2: 100% 100% 100% 100%  Weight:      Height:         General:  Appears calm and comfortable, no acute distress Eyes:  PERRL, EOMI, normal lids, iris ENT:  grossly normal hearing, lips & tongue, mucous membranes of her mouth are moist and pink Neck:  no LAD, masses or thyromegaly Cardiovascular:  RRR, no m/r/g. No LE edema. Pedal pulses present and palpable Respiratory:  CTA bilaterally, no w/r/r. Normal respiratory effort. Abdomen:  soft, ntnd, positive bowel sounds no guarding or rebounding Skin:  no rash or induration seen on limited exam Musculoskeletal:  grossly normal tone BUE/BLE, good ROM, no bony abnormality, joints without swelling/erythema Psychiatric:  grossly normal mood and affect, speech fluent and appropriate, AOx3 Neurologic:  CN 2-12 grossly intact, moves all extremities in coordinated fashion, sensation intact  Labs on Admission: I have personally reviewed following labs and imaging studies  CBC:  Recent Labs Lab 06/20/16 1125  WBC 18.0*  HGB 5.3*  HCT 16.0*  MCV 80.8  PLT 0,626*   Basic Metabolic Panel:  Recent Labs Lab 06/20/16 1125  NA 140  K 2.6*  CL 108  CO2 20*  GLUCOSE 93  BUN 63*  CREATININE 4.13*  CALCIUM 9.0  MG 2.4  PHOS 5.4*   GFR: Estimated Creatinine Clearance: 17.1 mL/min (by C-G formula based on SCr of 4.13 mg/dL (H)). Liver Function Tests:  Recent Labs Lab 06/20/16 1125  AST 11*  ALT 12*  ALKPHOS 71  BILITOT 0.4  PROT 8.7*  ALBUMIN 3.5   No results for input(s): LIPASE, AMYLASE in the last 168 hours. No results for input(s): AMMONIA in the last 168 hours. Coagulation Profile:  Recent Labs Lab  06/20/16 1125  INR 1.20   Cardiac Enzymes: No results for input(s): CKTOTAL, CKMB, CKMBINDEX, TROPONINI in the last 168 hours. BNP (last 3 results) No results for input(s): PROBNP in the last 8760 hours. HbA1C: No results for input(s): HGBA1C in the last 72 hours. CBG: No results for input(s): GLUCAP in the last 168 hours. Lipid Profile: No results for input(s): CHOL, HDL, LDLCALC, TRIG, CHOLHDL, LDLDIRECT in the last 72 hours. Thyroid Function Tests: No results for input(s): TSH, T4TOTAL, FREET4, T3FREE, THYROIDAB in the last 72 hours. Anemia Panel:  Recent Labs  06/20/16 1125 06/20/16 1236  VITAMINB12  --  1,035*  FOLATE  --  30.9  FERRITIN  --  476*  TIBC  --  315  IRON  --  55  RETICCTPCT 6.7*  --    Urine analysis:    Component Value Date/Time   COLORURINE RED (A) 06/20/2016 1140   APPEARANCEUR TURBID (A) 06/20/2016 1140   LABSPEC 1.011 06/20/2016 1140   PHURINE 5.5 06/20/2016 1140   GLUCOSEU NEGATIVE 06/20/2016 1140   HGBUR LARGE (A) 06/20/2016 1140   BILIRUBINUR NEGATIVE 06/20/2016 1140   KETONESUR 15 (A) 06/20/2016 1140   PROTEINUR 100 (A) 06/20/2016 1140   UROBILINOGEN 0.2 02/13/2015 1823   NITRITE NEGATIVE 06/20/2016 1140   LEUKOCYTESUR MODERATE (A) 06/20/2016 1140    Creatinine Clearance: Estimated Creatinine Clearance: 17.1 mL/min (by C-G formula based on SCr of 4.13 mg/dL (H)).  Sepsis Labs: @LABRCNTIP (procalcitonin:4,lacticidven:4) )No results found for this or any previous visit (from the past 240 hour(s)).   Radiological Exams on Admission: Dg Chest 2 View  Result Date: 06/20/2016 CLINICAL DATA:  Elevated white count. EXAM: CHEST  2 VIEW COMPARISON:  Radiographs of November 17, 2009. FINDINGS: The heart size and mediastinal contours are within normal limits. No pneumothorax or pleural effusion is noted. Stable mild bibasilar scarring is noted. The visualized skeletal structures are unremarkable. IMPRESSION: Stable mild bibasilar scarring.  Electronically Signed   By: Marijo Conception, M.D.   On: 06/20/2016 17:40   Ct Renal Stone Study  Result Date: 06/20/2016 CLINICAL DATA:  RIGHT flank pain, hematuria EXAM: CT ABDOMEN AND PELVIS WITHOUT CONTRAST TECHNIQUE: Multidetector CT imaging of the abdomen and pelvis was performed following the standard protocol without IV contrast. COMPARISON:  CT 04/20/2009 FINDINGS: Lower chest: Lung bases are clear. Hepatobiliary: No focal hepatic lesion. No biliary duct dilatation. Gallbladder is normal. Common bile duct is normal. Pancreas: Pancreas is normal. No ductal dilatation. No pancreatic inflammation. Spleen: Normal spleen Adrenals/urinary tract: Adrenal glands normal. Hyperdense lesion in the inferior pole of the RIGHT kidney is ill-defined measuring approximately 4.0 x 4.0 cm (image 36, series 2). Cyst in upper pole of the RIGHT kidney. RIGHT ureter appears normal. There are low-density lesions in the LEFT kidney which cannot be fully characterize. LEFT ureter normal. The bladder is normal. Stomach/Bowel: Stomach, small bowel, appendix, and cecum are normal. The colon and rectosigmoid colon are normal. Vascular/Lymphatic: Abdominal aorta is normal caliber. There is no retroperitoneal or periportal lymphadenopathy. No pelvic lymphadenopathy. Reproductive: Uterus and ovaries normal. Other: No free fluid. Musculoskeletal: No aggressive osseous lesion. IMPRESSION: 1. High-density 4 cm lesion in lower pole of the RIGHT kidney with differential including renal neoplasm, renal hemorrhage, or renal infection. If patient cannot receive IV contrast, a noncontrast MRI may provide increased specificity of this lesion. 2. No obstructive uropathy. 3. Normal bladder. Electronically Signed   By: Suzy Bouchard M.D.   On: 06/20/2016 12:39    EKG:   Assessment/Plan Principal Problem:   Acute blood loss anemia Active Problems:   Hematuria   Hypokalemia   Leukocytosis   Hypertension   Diabetes mellitus without  complication (HCC)   Acute renal failure superimposed on stage 3 chronic kidney disease (HCC)   Thrombocytosis (HCC)   Abnormal CT scan   #1. Acute on chronic blood loss anemia likely related to 2 week history of hematuria worsening over the last 3 days. Hemoglobin 5.3 on admission. CT with lesion lower lobe of right kidney. Chart review indicates hemoglobin 8.6 year ago. -Admit to telemetry -Obtain an anemia panel -Transfuse 2 units packed RBCs -Serial H&H -Anemia panel -smear in process -Monitor urine output  #2. Hematuria/abnormal CT. 2 week history of dysuria  with worsening over the last 3 days. CT as noted above. She does have a leukocytosis. She has no smoking history. -Noncontrast MRI tomorrow  #3. Acute renal failure superimposed on stage III chronic kidney disease. Likely related to above in the setting of progression of disease. Creatinine 4.13 on admission. Chart review indicates that sitting 3.181 month ago and 2.16 1 year ago. CT as noted above. -Hold nephrotoxins for now -Gentle IV fluids -Noncontrast MRI as noted above -Monitor urine output -Recheck in the morning  #4. Leukocytosis. CBC 18.0 on admission. Chart review indicates WBC 16.7 a month ago and 14.4 a year ago. CT report indicates lesion on kidney and may be an infectious process. He is afebrile hemodynamically stable and nontoxic appearing. -Rocephin -Follow urine culture -Obtain a chest x-ray -Monitor  5. Hypokalemia. Potassium level 2.6 on admission. Chart review indicates potassium level 3.0 a month ago and a year ago. She received 10 mEq of potassium intravenously and 40 mEq orally in the emergency department. Mag level within the limits of normal -Repeat 40 mEq of potassium orally -Recheck  #6. Diabetes. Patient reports recently metformin discontinued. Serum glucose 93 on admission. -We'll obtain a hemoglobin A1c -Provide car modified diet -Monitor  7. Hypertension. Controlled on admission. Home  medications include hydrochlorothiazide, amlodipine, and as well, labetalol. -We'll hold hydrochlorothiazide and benazapril and amlodipine -continue BB -Monitor -Resume home meds as indicated  #8. Thrombocytosis. Platelets 1119. Chart review indicates chronically elevated but marked increase in last month -smear in process  9. History of stroke. She has stroke in 2010 and has short-term memory deficit. stable    DVT prophylaxis: scd Code Status: full  Family Communication: husband at bedside  Disposition Plan: home   Consults called: none  Admission status: inpatient    Radene Gunning MD [facility]  If 7PM-7AM, please contact night-coverage [email or web address] Password [email or web address]  06/20/2016, 5:43 PM

## 2016-06-20 NOTE — Progress Notes (Signed)
Ceftriaxone for UTI per pharmacy ordered.  Ceftriaxone does not need renal adjustment.  [employer] policy allows pharmacy to change the ordered dose based on indication without contacting the provider, therefore a consult is not required.   Plan: -ceftriaxone 1g IV q24h -pharmacy to sign off as no adjustment needed.

## 2016-06-20 NOTE — ED Provider Notes (Signed)
[facility] [facility] DEPT [facility] Provider Note   CSN: [id number] Arrival date & time: 06/20/16  2505     History   Chief Complaint Chief Complaint  Patient presents with  . Hematuria    HPI Julie Wells is a 45 y.o. female.  HPI  Pt presenting with c/o right flank pain and hematuria.  She had fever 2 weeks ago and was started on abx for UTI- she had an allergic reaction to abx- approx 6 days ago was started on po keflex- over the past 2 days she has seen more bright red blood in urine.  She continues to have right sided flank pain.  No fever/chills. No nausea or vomiting.  There are no other associated systemic symptoms, there are no other alleviating or modifying factors.   Past Medical History:  Diagnosis Date  . Diabetes mellitus without complication ([place])   . Frequent headaches   . Hypertension   . Migraine   . Sickle cell trait ([place])   . Stroke [facility])     Patient Active Problem List   Diagnosis Date Noted  . Anemia 06/20/2016    Past Surgical History:  Procedure Laterality Date  . CESAREAN SECTION      OB History    No data available       Home Medications    Prior to Admission medications   Medication Sig Start Date End Date Taking? Authorizing Provider  cephALEXin (KEFLEX) 500 MG capsule Take 500 mg by mouth 4 (four) times daily.   Yes Historical Provider, MD  ergocalciferol (VITAMIN D2) 50000 units capsule Take 50,000 Units by mouth once a week.   Yes Historical Provider, MD  HYDROCHLOROTHIAZIDE PO Take 25 mg by mouth daily.   Yes Historical Provider, MD  SUMAtriptan (IMITREX) 25 MG tablet Take 25 mg by mouth every 2 (two) hours as needed for migraine. May repeat in 2 hours if headache persists or recurs.   Yes Historical Provider, MD  acetaminophen (TYLENOL) 500 MG tablet Take 1,000 mg by mouth every 6 (six) hours as needed for mild pain or headache.    Historical Provider, MD  amLODipine-benazepril (LOTREL) 10-40 MG per capsule Take 1 capsule by mouth daily.     Historical Provider, MD  hydrALAZINE (APRESOLINE) 100 MG tablet Take 100 mg by mouth 3 (three) times daily.  05/10/14   Historical Provider, MD  iron polysaccharides (NIFEREX) 150 MG capsule Take 150 mg by mouth daily.    Historical Provider, MD  labetalol (NORMODYNE) 100 MG tablet Take 100 mg by mouth 2 (two) times daily.    Historical Provider, MD  pravastatin (PRAVACHOL) 20 MG tablet Take 20 mg by mouth at bedtime.  05/10/14   Historical Provider, MD  topiramate (TOPAMAX) 25 MG tablet Take 25 mg by mouth 2 (two) times daily.  05/15/14   Historical Provider, MD    Family History No family history on file.  Social History Social History  Substance Use Topics  . Smoking status: Never Smoker  . Smokeless tobacco: Never Used  . Alcohol use No     Allergies   Levaquin [levofloxacin in d5w] and Levaquin [levofloxacin in d5w]   Review of Systems Review of Systems  ROS reviewed and all otherwise negative except for mentioned in HPI   Physical Exam Updated Vital Signs BP 121/71 (BP Location: Left Arm)   Pulse 76   Temp 98.2 F (36.8 C) (Oral)   Resp 15   Ht 5\' 2"  (1.575 m)   Wt 190  lb (86.2 kg)   LMP 05/31/2016   SpO2 100%   BMI 34.75 kg/m  Vitals reviewed Physical Exam Physical Examination: General appearance - alert, well appearing, and in no distress Mental status - alert, oriented to person, place, and time Eyes - no conjunctival injection no scleral icterus Chest - clear to auscultation, no wheezes, rales or rhonchi, symmetric air entry Heart - normal rate, regular rhythm, normal S1, S2, no murmurs, rubs, clicks or gallops Abdomen - soft, nontender, nondistended, no masses or organomegaly Back exam - full range of motion, no midline tenderness, mild right CVA tenderness Extremities - peripheral pulses normal, no pedal edema, no clubbing or cyanosis Skin - normal coloration and turgor, no rashes  ED Treatments / Results  Labs (all labs ordered are listed, but only  abnormal results are displayed) Labs Reviewed  URINALYSIS, ROUTINE W REFLEX MICROSCOPIC (NOT AT [facility]) - Abnormal; Notable for the following:       Result Value   Color, Urine RED (*)    APPearance TURBID (*)    Hgb urine dipstick LARGE (*)    Ketones, ur 15 (*)    Protein, ur 100 (*)    Leukocytes, UA MODERATE (*)    All other components within normal limits  CBC - Abnormal; Notable for the following:    WBC 18.0 (*)    RBC 1.98 (*)    Hemoglobin 5.3 (*)    HCT 16.0 (*)    RDW 18.0 (*)    Platelets 1,119 (*)    All other components within normal limits  BASIC METABOLIC PANEL - Abnormal; Notable for the following:    Potassium 2.6 (*)    CO2 20 (*)    BUN 63 (*)    Creatinine, Ser 4.13 (*)    GFR calc non Af Amer 12 (*)    GFR calc Af Amer 14 (*)    All other components within normal limits  VITAMIN B12 - Abnormal; Notable for the following:    Vitamin B-12 1,035 (*)    All other components within normal limits  FERRITIN - Abnormal; Notable for the following:    Ferritin 476 (*)    All other components within normal limits  RETICULOCYTES - Abnormal; Notable for the following:    Retic Ct Pct 6.7 (*)    RBC. 2.01 (*)    All other components within normal limits  URINE MICROSCOPIC-ADD ON - Abnormal; Notable for the following:    Squamous Epithelial / LPF 0-5 (*)    Bacteria, UA FEW (*)    All other components within normal limits  PHOSPHORUS - Abnormal; Notable for the following:    Phosphorus 5.4 (*)    All other components within normal limits  HEPATIC FUNCTION PANEL - Abnormal; Notable for the following:    Total Protein 8.7 (*)    AST 11 (*)    ALT 12 (*)    Bilirubin, Direct <0.1 (*)    All other components within normal limits  PROTIME-INR - Abnormal; Notable for the following:    Prothrombin Time 15.3 (*)    All other components within normal limits  APTT - Abnormal; Notable for the following:    aPTT 42 (*)    All other components within normal limits    PREGNANCY, URINE  FOLATE  IRON AND TIBC  MAGNESIUM  PATHOLOGIST SMEAR REVIEW  TYPE AND SCREEN    EKG  EKG Interpretation None       Radiology Ct Renal Stone  Study  Result Date: 06/20/2016 CLINICAL DATA:  RIGHT flank pain, hematuria EXAM: CT ABDOMEN AND PELVIS WITHOUT CONTRAST TECHNIQUE: Multidetector CT imaging of the abdomen and pelvis was performed following the standard protocol without IV contrast. COMPARISON:  CT 04/20/2009 FINDINGS: Lower chest: Lung bases are clear. Hepatobiliary: No focal hepatic lesion. No biliary duct dilatation. Gallbladder is normal. Common bile duct is normal. Pancreas: Pancreas is normal. No ductal dilatation. No pancreatic inflammation. Spleen: Normal spleen Adrenals/urinary tract: Adrenal glands normal. Hyperdense lesion in the inferior pole of the RIGHT kidney is ill-defined measuring approximately 4.0 x 4.0 cm (image 36, series 2). Cyst in upper pole of the RIGHT kidney. RIGHT ureter appears normal. There are low-density lesions in the LEFT kidney which cannot be fully characterize. LEFT ureter normal. The bladder is normal. Stomach/Bowel: Stomach, small bowel, appendix, and cecum are normal. The colon and rectosigmoid colon are normal. Vascular/Lymphatic: Abdominal aorta is normal caliber. There is no retroperitoneal or periportal lymphadenopathy. No pelvic lymphadenopathy. Reproductive: Uterus and ovaries normal. Other: No free fluid. Musculoskeletal: No aggressive osseous lesion. IMPRESSION: 1. High-density 4 cm lesion in lower pole of the RIGHT kidney with differential including renal neoplasm, renal hemorrhage, or renal infection. If patient cannot receive IV contrast, a noncontrast MRI may provide increased specificity of this lesion. 2. No obstructive uropathy. 3. Normal bladder. Electronically Signed   By: Suzy Bouchard M.D.   On: 06/20/2016 12:39    Procedures Procedures (including critical care time)  Medications Ordered in ED Medications   sodium chloride 0.9 % bolus 1,000 mL (0 mLs Intravenous Stopped 06/20/16 1332)  potassium chloride 10 mEq in 100 mL IVPB (0 mEq Intravenous Stopped 06/20/16 1347)  potassium chloride SA (K-DUR,KLOR-CON) CR tablet 40 mEq (40 mEq Oral Given 06/20/16 1229)     Initial Impression / Assessment and Plan / ED Course  I have reviewed the triage vital signs and the nursing notes.  Pertinent labs & imaging results that were available during my care of the patient were reviewed by me and considered in my medical decision making (see chart for details).  Clinical Course    Pt presenting with c/o gross hematuria and right sided flank pain.  Workup reveals significant anemia at 5.3, thrombocytosis, hypokalemia.  Potassium repleted, anemia panel ordered as well as type and screen- not able to transfuse blood at [facility]- [facility].  Thrombocytosis likely reactive, renal insufficiency worsened from baseline- started on IV fluids.  CT scan shows renal abnormality on right - will likely need MR during admission to further characterize.  Pt admitted to Dr. Sarina Ser, triad at [facility] to telemetry bed.  Pt updated about findings and plan.    Final Clinical Impressions(s) / ED Diagnoses   Final diagnoses:  Gross hematuria  Anemia due to other cause  Acute renal failure, unspecified acute renal failure type ([place])  Hypokalemia  Thrombocytosis [facility])    New Prescriptions Current Discharge Medication List       Alfonzo Beers, MD 06/20/16 1637

## 2016-06-21 DIAGNOSIS — R319 Hematuria, unspecified: Secondary | ICD-10-CM

## 2016-06-21 DIAGNOSIS — E876 Hypokalemia: Secondary | ICD-10-CM

## 2016-06-21 DIAGNOSIS — E119 Type 2 diabetes mellitus without complications: Secondary | ICD-10-CM

## 2016-06-21 DIAGNOSIS — R938 Abnormal findings on diagnostic imaging of other specified body structures: Secondary | ICD-10-CM

## 2016-06-21 DIAGNOSIS — I10 Essential (primary) hypertension: Secondary | ICD-10-CM

## 2016-06-21 LAB — GLUCOSE, CAPILLARY
Glucose-Capillary: 81 mg/dL (ref 65–99)
Glucose-Capillary: 106 mg/dL — ABNORMAL HIGH (ref 65–99)
Glucose-Capillary: 87 mg/dL (ref 65–99)
Glucose-Capillary: 114 mg/dL — ABNORMAL HIGH (ref 65–99)

## 2016-06-21 LAB — BASIC METABOLIC PANEL
Anion gap: 10 (ref 5–15)
BUN: 54 mg/dL — ABNORMAL HIGH (ref 6–20)
CO2: 18 mmol/L — ABNORMAL LOW (ref 22–32)
Calcium: 9.3 mg/dL (ref 8.9–10.3)
Chloride: 115 mmol/L — ABNORMAL HIGH (ref 101–111)
Creatinine, Ser: 3.76 mg/dL — ABNORMAL HIGH (ref 0.44–1.00)
GFR calc Af Amer: 15 mL/min — ABNORMAL LOW (ref >60)
GFR calc non Af Amer: 13 mL/min — ABNORMAL LOW (ref >60)
Glucose, Bld: 89 mg/dL (ref 65–99)
Potassium: 3.3 mmol/L — ABNORMAL LOW (ref 3.5–5.1)
Sodium: 143 mmol/L (ref 135–145)

## 2016-06-21 LAB — CBC
HCT: 23.3 % — ABNORMAL LOW (ref 36.0–46.0)
Hemoglobin: 7.9 g/dL — ABNORMAL LOW (ref 12.0–15.0)
MCH: 28 pg (ref 26.0–34.0)
MCHC: 33.9 g/dL (ref 30.0–36.0)
MCV: 82.6 fL (ref 78.0–100.0)
Platelets: 912 10*3/uL (ref 150–400)
RBC: 2.82 MIL/uL — ABNORMAL LOW (ref 3.87–5.11)
RDW: 17.8 % — ABNORMAL HIGH (ref 11.5–15.5)
WBC: 14.8 10*3/uL — ABNORMAL HIGH (ref 4.0–10.5)

## 2016-06-21 LAB — PATHOLOGIST SMEAR REVIEW

## 2016-06-21 LAB — PREPARE RBC (CROSSMATCH)

## 2016-06-21 LAB — RETICULOCYTES
RBC.: 3.16 MIL/uL — ABNORMAL LOW (ref 3.87–5.11)
Retic Count, Absolute: 158 10*3/uL (ref 19.0–186.0)
Retic Ct Pct: 5 % — ABNORMAL HIGH (ref 0.4–3.1)

## 2016-06-21 LAB — DIC (DISSEMINATED INTRAVASCULAR COAGULATION)PANEL
D-Dimer, Quant: 2.59 ug/mL-FEU — ABNORMAL HIGH (ref 0.00–0.50)
Fibrinogen: 733 mg/dL — ABNORMAL HIGH (ref 210–475)
INR: 1.16
Platelets: 913 10*3/uL (ref 150–400)
Prothrombin Time: 14.9 seconds (ref 11.4–15.2)
Smear Review: NONE SEEN
aPTT: 36 seconds (ref 24–36)

## 2016-06-21 LAB — HEMOGLOBIN AND HEMATOCRIT, BLOOD
HCT: 26.6 % — ABNORMAL LOW (ref 36.0–46.0)
Hemoglobin: 9.1 g/dL — ABNORMAL LOW (ref 12.0–15.0)

## 2016-06-21 LAB — HEMOGLOBIN A1C
Hgb A1c MFr Bld: 4.5 % — ABNORMAL LOW (ref 4.8–5.6)
Mean Plasma Glucose: 82 mg/dL

## 2016-06-21 LAB — LACTATE DEHYDROGENASE: LDH: 157 U/L (ref 98–192)

## 2016-06-21 MED ORDER — POTASSIUM CHLORIDE CRYS ER 20 MEQ PO TBCR
40.0000 meq | EXTENDED_RELEASE_TABLET | Freq: Once | ORAL | Status: AC
  Administered 2016-06-21: 40 meq via ORAL
  Filled 2016-06-21: qty 2

## 2016-06-21 MED ORDER — SODIUM CHLORIDE 0.9 % IV SOLN
Freq: Once | INTRAVENOUS | Status: AC
  Administered 2016-06-21: 18:00:00 via INTRAVENOUS

## 2016-06-21 MED ORDER — SODIUM CHLORIDE 0.9 % IV SOLN
Freq: Once | INTRAVENOUS | Status: AC
  Administered 2016-06-21: 09:00:00 via INTRAVENOUS

## 2016-06-21 NOTE — Progress Notes (Signed)
Return call from [place], and orders to give Hydralazine 5mg  instead of scheduled Labetalol tonight for BP.  Will continue to monitor.  Alphonzo Lemmings, RN

## 2016-06-21 NOTE — Consult Note (Signed)
4:37 PM   Julie Wells [facility] [birth year] [id number]  Referring provider: Dr. Tyler Pita  Chief Complaint  Patient presents with  . Hematuria    HPI: The patient is a 45 year old female with past medical history selected for sickle cell trait, anemia of chronic disease, and short-term memory loss presents to the hospital with hematuria. History was obtained from her husband as she is a poor historian. Her hematuria started a few days ago. There is not been clots. It is clear light red in color. There have been no clots or filling incomplete bladder emptying. She has never had hematuria before. When she was admitted her hemoglobin was 5.3. She was given 2 units of packed red blood cells and it rose to 7.9. She also has baseline CK D with a creatinine the twos. She presented with a creatinine of 4. Imaging which was a CT without contrast and MRI without contrast due to renal function showed what is likely a hemorrhagic cyst but renal malignancy cannot be ruled out.   PMH: Past Medical History:  Diagnosis Date  . Diabetes mellitus without complication ([place])   . Frequent headaches   . Hypertension   . Migraine   . Sickle cell trait ([place])   . Stroke [facility])     Surgical History: Past Surgical History:  Procedure Laterality Date  . CESAREAN SECTION      Home Medications: Reviewed  Allergies:  Allergies  Allergen Reactions  . Levaquin [Levofloxacin In D5w] Hives and Swelling  . Other Swelling    Unnamed med caused swelling of the lips (patient cannot recall the indication or name)    Family History: No family history on file.  Social History:  reports that she has never smoked. She has never used smokeless tobacco. She reports that she does not drink alcohol or use drugs.  ROS: 12 point ROS negative except per HPI                                        Physical Exam: BP (!) 157/73   Pulse 62   Temp 98.3 F (36.8 C) (Oral)   Resp 16   Ht 5\' 2"  (1.575 m)    Wt 190 lb (86.2 kg)   LMP 05/31/2016   SpO2 100%   BMI 34.75 kg/m   Constitutional:  Alert and oriented, No acute distress. HEENT: [place] AT, moist mucus membranes.  Trachea midline, no masses. Cardiovascular: No clubbing, cyanosis, or edema. Respiratory: Normal respiratory effort, no increased work of breathing. GI: Abdomen is soft, nontender, nondistended, no abdominal masses GU: No CVA tenderness.  Skin: No rashes, bruises or suspicious lesions. Lymph: No cervical or inguinal adenopathy. Neurologic: Grossly intact, no focal deficits, moving all 4 extremities. Psychiatric: Normal mood and affect.  Laboratory Data: Lab Results  Component Value Date   WBC 14.8 (H) 06/21/2016   HGB 7.9 (L) 06/21/2016   HCT 23.3 (L) 06/21/2016   MCV 82.6 06/21/2016   PLT 912 ([place]) 06/21/2016    Lab Results  Component Value Date   CREATININE 3.76 (H) 06/21/2016    No results found for: PSA  No results found for: TESTOSTERONE  Lab Results  Component Value Date   HGBA1C 4.5 (L) 06/20/2016    Urinalysis    Component Value Date/Time   COLORURINE RED (A) 06/20/2016 1140   APPEARANCEUR TURBID (A) 06/20/2016 1140   LABSPEC 1.011 06/20/2016 1140  PHURINE 5.5 06/20/2016 1140   GLUCOSEU NEGATIVE 06/20/2016 1140   HGBUR LARGE (A) 06/20/2016 1140   BILIRUBINUR NEGATIVE 06/20/2016 1140   KETONESUR 15 (A) 06/20/2016 1140   PROTEINUR 100 (A) 06/20/2016 1140   UROBILINOGEN 0.2 02/13/2015 1823   NITRITE NEGATIVE 06/20/2016 1140   LEUKOCYTESUR MODERATE (A) 06/20/2016 1140    Pertinent Imaging: CT and MRI without contrast reviewed as above  Assessment & Plan:    1. Hematuria 2. Acute on chronic renal failure 3. Anemia 4. Hemorrhagic renal cyst I do not think the hemorrhagic renal cyst is related to her recent onset of hematuria is likely an incidental finding. It would be extremely rare for acute blood loss anemia from hematuria in a patient that has not experienced significant clots. I'm  suspicious that this is not the cause of her recent drop in hemoglobin. As she is voiding well with L clots, there is no acute urological and tension indicated at this time. She will need outpatient follow-up for completion of a hematuria workup. This should at the very least will need cystoscopy. She'll also need outpatient follow-up on the hemorrhagic cyst to ensure it is not a malignancy with repeat imaging studies.   Nickie Retort, MD

## 2016-06-21 NOTE — Progress Notes (Signed)
CRITICAL VALUE ALERT  Critical value received:  Platelet count 912  Date of notification:  06/21/16  Time of notification:  0808  Critical value read back:Yes.    Nurse who received alert:  Ileene Rubens, RN  MD notified (1st page):  Rai  Time of first page:  0813  MD notified (2nd page):  Time of second page:  Responding MD:  Tana Coast  Time MD responded:  Signy.Helper  MD reported value is getting better. No new orders placed at this time.

## 2016-06-21 NOTE — Progress Notes (Signed)
   06/21/16 2159  Vitals  Temp 97.8 F (36.6 C)  Temp Source Oral  BP (!) 176/83  MAP (mmHg) 106  BP Location Left Arm  BP Method Automatic  Patient Position (if appropriate) Lying  Pulse Rate 66  Pulse Rate Source Monitor  Resp 20  Oxygen Therapy  SpO2 100 %  O2 Device Room Air   HR on telemetry monitoring is 52.  Text paged K. Schorr to see if it would be ok to give prn BP meds.  Will continue to monitor and await for new orders.  Alphonzo Lemmings, RN

## 2016-06-21 NOTE — Progress Notes (Signed)
Transfusion of blood discontinued per verbal order from MD, Rai. Awaiting new CBC results and will put in new order to transfuse if hgb is below 7.

## 2016-06-21 NOTE — Progress Notes (Signed)
[facility]                                                                              Patient Demographics  Julie Wells, is a 45 y.o. female, DOB - 1971-03-04, SJG:[id number]  Admit date - 06/20/2016   Admitting Physician Elwin Mocha, MD  Outpatient Primary MD for the patient is OSEI-BONSU,GEORGE, MD  Outpatient specialists:   LOS - 1  days    Chief Complaint  Patient presents with  . Hematuria       Brief summary   Julie Wells is a very pleasant 45 y.o. female with medical history significant for diabetes diet controlled, hypertension, stroke 2010 with short-term memory deficits, migraine presented with hematuria and right flank pain. About 2 weeks ago she was diagnosed with urinary tract infection by her PCP was started on Keflex. At that time PCP informed patient she had "scant" amounts of blood in her urine. She reports some sort of reaction to Keflex and stopped taking it. Over the last 2 days she developed large amounts of bright red blood in her urine as well as right flank pain. She denied dysuria frequency or urgency. She denied any fever chills nausea or vomiting. She denied any constipation diarrhea melena or bright red blood per rectum. ED workup showed potassium of 2.6, BUN 63, creatinine 4.1, hemoglobin of 5.3, WBCs 18K, platelets 1119 CT renal stone study showed high density 4 cm lesion in the lower pole of the right kidney, recommended noncontrast MRI MRI of the abdomen showed complex mass in the lower pole of the right kidney more consistent with hemorrhage, likely hemorrhagic cyst, however hemorrhagic tumor cannot be completely excluded. Recommended short-term (2 months), follow-up MRI.  Assessment & Plan    Principal Problem:   Acute blood loss anemia with hematuria, hemorrhagic renal cyst - Patient received 2 units of packed RBCs overnight, hemoglobin 7.9 this morning, Hb 5.3 on admission - Will transfuse 1 more unit, called urology  consult, d/w Dr Pilar Jarvis, will evaluate patient today for further workup - Per patient right flank pain has now improved. - Anemia workup showed consistent with anemia of chronic disease although iron saturation slightly low at 17%, B12 folate normal - Also obtain hemolytic workup, reticulocyte count, LDH, haptoglobin, DIC panel. Per patient she has sickle cell trait  Active Problems:   Hematuria - Likely due to #1, follow closely    Hypokalemia - Replaced    Leukocytosis - Likely due to UTI, follow urine cultures, continue IV Rocephin     Hypertension - Currently stable, continue labetalol, hydralazine as needed    Diabetes mellitus without complication (HCC) - Currently stable, continue sliding scale insulin    Acute renal failure superimposed on stage 3 chronic kidney disease (HCC) - Improving with packed RBC transfusion - Baseline ~3 (3.1 on 8/6)     Thrombocytosis (HCC) - Possibly essential thrombocytosis, improving after the packed RBC transfusion - Outpatient follow-up by PCP and hematology referral  - MRI abdomen shows small T2 hypointense spleen consistent with iron deposition or auto infarction (more consistent with her sickle cell history.)   Code Status:Full code  DVT Prophylaxis: SCD's Family Communication: Discussed in detail with the patient, all imaging results, lab results explained to the patient   Disposition Plan   Time Spent in minutes   25 minutes  Procedures:  MRI abd CT renal stone study   Consultants:   Urology, Dr. Pilar Jarvis   Antimicrobials:   IV Rocephin 10/1   Medications  Scheduled Meds: . sodium chloride   Intravenous Once  . sodium chloride   Intravenous Once  . sodium chloride   Intravenous Once  . cefTRIAXone (ROCEPHIN)  IV  1 g Intravenous Q24H  . insulin aspart  0-9 Units Subcutaneous TID WC  . iron polysaccharides  150 mg Oral Daily  . labetalol  100 mg Oral BID  . pravastatin  20 mg Oral QHS  . sodium chloride flush  3 mL  Intravenous Q12H   Continuous Infusions:  PRN Meds:.acetaminophen **OR** acetaminophen, hydrALAZINE, HYDROcodone-acetaminophen, ondansetron **OR** ondansetron (ZOFRAN) IV, zolpidem   Antibiotics   Anti-infectives    Start     Dose/Rate Route Frequency Ordered Stop   06/20/16 1730  cefTRIAXone (ROCEPHIN) 1 g in dextrose 5 % 50 mL IVPB  Status:  Discontinued     1 g 100 mL/hr over 30 Minutes Intravenous Every 24 hours 06/20/16 1725 06/20/16 1727   06/20/16 1730  cefTRIAXone (ROCEPHIN) 1 g in dextrose 5 % 50 mL IVPB     1 g 100 mL/hr over 30 Minutes Intravenous Every 24 hours 06/20/16 1727 06/27/16 1729        Subjective:   Julie Wells was seen and examined today.  Feeling better today, right-sided flank pain improving, still having hematuria. No fevers. Patient denies dizziness, chest pain, shortness of breath, abdominal pain, N/V/D/C, new weakness, numbess, tingling.   Objective:   Vitals:   06/21/16 0146 06/21/16 0215 06/21/16 0240 06/21/16 0457  BP: (!) 145/76 131/80 (!) 145/88 (!) 149/91  Pulse: 68     Resp: 17 18 19 18   Temp: 98.6 F (37 C) 98.6 F (37 C) 98.4 F (36.9 C) 98.1 F (36.7 C)  TempSrc: Oral Oral  Oral  SpO2: 100% 100% 100% 100%  Weight:      Height:        Intake/Output Summary (Last 24 hours) at 06/21/16 0912 Last data filed at 06/21/16 0814  Gross per 24 hour  Intake             1820 ml  Output             1950 ml  Net             -130 ml     Wt Readings from Last 3 Encounters:  06/20/16 86.2 kg (190 lb)  04/25/16 71.7 kg (158 lb)  02/25/15 73.5 kg (162 lb)     Exam  General: Alert and oriented x 3, NAD  HEENT:  PERRLA, EOMI, Anicteric Sclera, mucous membranes moist.   Neck: Supple, no JVD, no masses  Cardiovascular: S1 S2 auscultated, no rubs, murmurs or gallops. Regular rate and rhythm.  Respiratory: Clear to auscultation bilaterally, no wheezing, rales or rhonchi  Gastrointestinal: Soft, nontender, nondistended, + bowel  sounds, No CVAT   Ext: no cyanosis clubbing or edema  Neuro: AAOx3, Cr N's II- XII. Strength 5/5 upper and lower extremities bilaterally  Skin: No rashes  Psych: Normal affect and demeanor, alert and oriented x3    Data Reviewed:  I have personally reviewed following labs and imaging studies  Micro Results No results  found for this or any previous visit (from the past 240 hour(s)).  Radiology Reports Dg Chest 2 View  Result Date: 06/20/2016 CLINICAL DATA:  Elevated white count. EXAM: CHEST  2 VIEW COMPARISON:  Radiographs of November 17, 2009. FINDINGS: The heart size and mediastinal contours are within normal limits. No pneumothorax or pleural effusion is noted. Stable mild bibasilar scarring is noted. The visualized skeletal structures are unremarkable. IMPRESSION: Stable mild bibasilar scarring. Electronically Signed   By: Marijo Conception, M.D.   On: 06/20/2016 17:40   Mr Abdomen Wo Contrast  Result Date: 06/20/2016 CLINICAL DATA:  Hematuria with right flank pain. Indeterminate right renal mass on CT. History of diabetes, hypertension and stroke. EXAM: MRI ABDOMEN WITHOUT CONTRAST TECHNIQUE: Multiplanar multisequence MR imaging was performed without the administration of intravenous contrast. COMPARISON:  Noncontrast abdominal pelvic CT 06/20/2016 and 04/20/2009. Renal ultrasound 05/22/2014. FINDINGS: Lower chest: Probable left ventricular hypertrophy. The visualized lower chest otherwise appears unremarkable. Hepatobiliary: The liver appears unremarkable with normal signal and no focal abnormality. No evidence of gallstones, gallbladder wall thickening or biliary dilatation. Pancreas: Unremarkable. No pancreatic ductal dilatation or surrounding inflammatory changes. Spleen: The spleen is small with T2 hypointensity, likely due to iron deposition and/or auto infarction. No focal abnormality. Adrenals/Urinary Tract: Both adrenal glands appear normal. Multiple simple renal cysts are present  bilaterally. There are no worrisome left renal lesions. There is a complex mass in the lower pole of the right kidney which was hyperdense on noncontrast CT. This measures approximately 4.6 x 4.6 x 4.9 cm. This lesion is heterogeneous with areas of intrinsic T1 shortening and markedly decreased T2 signal. This suggests hemorrhagic components. No definite fatty components. No evidence of hydronephrosis. The visualized bladder appears unremarkable. Stomach/Bowel: No evidence of bowel wall thickening, distention or surrounding inflammatory change.The appendix appears normal. Vascular/Lymphatic: There are no enlarged abdominal lymph nodes. No significant vascular findings are present. Other: No ascites. Musculoskeletal: No acute or significant osseous findings. IMPRESSION: 1. The complex mass involving the lower pole of the right kidney demonstrates intrinsic T1 shortening and heterogeneous low T2 signal, features which are most consistent with hemorrhage. Overall, this is favored to reflect a hemorrhagic cyst. However, a hemorrhagic tumor cannot be completely excluded by this examination. The restrictions for class 2 gadolinium administration are in the process of being relaxed in the setting of renal failure. Therefore, a short-term (2 month) follow up MR examination to include post-contrast (MultiHance) imaging is suggested to further evaluate this lesion. 2. Both kidneys demonstrate multiple simple cysts. No hydronephrosis. 3. Small T2 hypo intense spleen consistent with iron deposition or auto infarction. Electronically Signed   By: Richardean Sale M.D.   On: 06/20/2016 19:30   Ct Renal Stone Study  Result Date: 06/20/2016 CLINICAL DATA:  RIGHT flank pain, hematuria EXAM: CT ABDOMEN AND PELVIS WITHOUT CONTRAST TECHNIQUE: Multidetector CT imaging of the abdomen and pelvis was performed following the standard protocol without IV contrast. COMPARISON:  CT 04/20/2009 FINDINGS: Lower chest: Lung bases are clear.  Hepatobiliary: No focal hepatic lesion. No biliary duct dilatation. Gallbladder is normal. Common bile duct is normal. Pancreas: Pancreas is normal. No ductal dilatation. No pancreatic inflammation. Spleen: Normal spleen Adrenals/urinary tract: Adrenal glands normal. Hyperdense lesion in the inferior pole of the RIGHT kidney is ill-defined measuring approximately 4.0 x 4.0 cm (image 36, series 2). Cyst in upper pole of the RIGHT kidney. RIGHT ureter appears normal. There are low-density lesions in the LEFT kidney which cannot be fully characterize. LEFT  ureter normal. The bladder is normal. Stomach/Bowel: Stomach, small bowel, appendix, and cecum are normal. The colon and rectosigmoid colon are normal. Vascular/Lymphatic: Abdominal aorta is normal caliber. There is no retroperitoneal or periportal lymphadenopathy. No pelvic lymphadenopathy. Reproductive: Uterus and ovaries normal. Other: No free fluid. Musculoskeletal: No aggressive osseous lesion. IMPRESSION: 1. High-density 4 cm lesion in lower pole of the RIGHT kidney with differential including renal neoplasm, renal hemorrhage, or renal infection. If patient cannot receive IV contrast, a noncontrast MRI may provide increased specificity of this lesion. 2. No obstructive uropathy. 3. Normal bladder. Electronically Signed   By: Suzy Bouchard M.D.   On: 06/20/2016 12:39    Lab Data:  CBC:  Recent Labs Lab 06/20/16 1125 06/21/16 0734  WBC 18.0* 14.8*  HGB 5.3* 7.9*  HCT 16.0* 23.3*  MCV 80.8 82.6  PLT 1,119* 229*   Basic Metabolic Panel:  Recent Labs Lab 06/20/16 1125 06/21/16 0734  NA 140 143  K 2.6* 3.3*  CL 108 115*  CO2 20* 18*  GLUCOSE 93 89  BUN 63* 54*  CREATININE 4.13* 3.76*  CALCIUM 9.0 9.3  MG 2.4  --   PHOS 5.4*  --    GFR: Estimated Creatinine Clearance: 18.8 mL/min (by C-G formula based on SCr of 3.76 mg/dL (H)). Liver Function Tests:  Recent Labs Lab 06/20/16 1125  AST 11*  ALT 12*  ALKPHOS 71  BILITOT 0.4   PROT 8.7*  ALBUMIN 3.5   No results for input(s): LIPASE, AMYLASE in the last 168 hours. No results for input(s): AMMONIA in the last 168 hours. Coagulation Profile:  Recent Labs Lab 06/20/16 1125  INR 1.20   Cardiac Enzymes: No results for input(s): CKTOTAL, CKMB, CKMBINDEX, TROPONINI in the last 168 hours. BNP (last 3 results) No results for input(s): PROBNP in the last 8760 hours. HbA1C: No results for input(s): HGBA1C in the last 72 hours. CBG:  Recent Labs Lab 06/20/16 2126 06/21/16 0824  GLUCAP 103* 81   Lipid Profile: No results for input(s): CHOL, HDL, LDLCALC, TRIG, CHOLHDL, LDLDIRECT in the last 72 hours. Thyroid Function Tests: No results for input(s): TSH, T4TOTAL, FREET4, T3FREE, THYROIDAB in the last 72 hours. Anemia Panel:  Recent Labs  06/20/16 1125 06/20/16 1236  VITAMINB12  --  1,035*  FOLATE  --  30.9  FERRITIN  --  476*  TIBC  --  315  IRON  --  55  RETICCTPCT 6.7*  --    Urine analysis:    Component Value Date/Time   COLORURINE RED (A) 06/20/2016 1140   APPEARANCEUR TURBID (A) 06/20/2016 1140   LABSPEC 1.011 06/20/2016 1140   PHURINE 5.5 06/20/2016 1140   GLUCOSEU NEGATIVE 06/20/2016 1140   HGBUR LARGE (A) 06/20/2016 1140   BILIRUBINUR NEGATIVE 06/20/2016 1140   KETONESUR 15 (A) 06/20/2016 1140   PROTEINUR 100 (A) 06/20/2016 1140   UROBILINOGEN 0.2 02/13/2015 1823   NITRITE NEGATIVE 06/20/2016 1140   LEUKOCYTESUR MODERATE (A) 06/20/2016 1140     Chares Slaymaker M.D. [facility] 06/21/2016, 9:12 AM  Pager: [phone] Between 7am to 7pm - call Pager - [phone]  After 7pm go to [email or web address] - password [email or web address]  Call night coverage person covering after 7pm

## 2016-06-22 LAB — BASIC METABOLIC PANEL
Anion gap: 9 (ref 5–15)
BUN: 53 mg/dL — ABNORMAL HIGH (ref 6–20)
CO2: 17 mmol/L — ABNORMAL LOW (ref 22–32)
Calcium: 9.4 mg/dL (ref 8.9–10.3)
Chloride: 113 mmol/L — ABNORMAL HIGH (ref 101–111)
Creatinine, Ser: 3.64 mg/dL — ABNORMAL HIGH (ref 0.44–1.00)
GFR calc Af Amer: 16 mL/min — ABNORMAL LOW (ref >60)
GFR calc non Af Amer: 14 mL/min — ABNORMAL LOW (ref >60)
Glucose, Bld: 82 mg/dL (ref 65–99)
Potassium: 2.9 mmol/L — ABNORMAL LOW (ref 3.5–5.1)
Sodium: 139 mmol/L (ref 135–145)

## 2016-06-22 LAB — HAPTOGLOBIN: Haptoglobin: 280 mg/dL — ABNORMAL HIGH (ref 34–200)

## 2016-06-22 LAB — GLUCOSE, CAPILLARY
Glucose-Capillary: 86 mg/dL (ref 65–99)
Glucose-Capillary: 110 mg/dL — ABNORMAL HIGH (ref 65–99)
Glucose-Capillary: 85 mg/dL (ref 65–99)
Glucose-Capillary: 100 mg/dL — ABNORMAL HIGH (ref 65–99)

## 2016-06-22 LAB — URINE CULTURE: Culture: NO GROWTH

## 2016-06-22 LAB — CBC
HCT: 26.3 % — ABNORMAL LOW (ref 36.0–46.0)
Hemoglobin: 8.9 g/dL — ABNORMAL LOW (ref 12.0–15.0)
MCH: 28.4 pg (ref 26.0–34.0)
MCHC: 33.8 g/dL (ref 30.0–36.0)
MCV: 84 fL (ref 78.0–100.0)
Platelets: 843 10*3/uL — ABNORMAL HIGH (ref 150–400)
RBC: 3.13 MIL/uL — ABNORMAL LOW (ref 3.87–5.11)
RDW: 19.7 % — ABNORMAL HIGH (ref 11.5–15.5)
WBC: 15.6 10*3/uL — ABNORMAL HIGH (ref 4.0–10.5)

## 2016-06-22 LAB — MAGNESIUM: Magnesium: 2.2 mg/dL (ref 1.7–2.4)

## 2016-06-22 LAB — POTASSIUM: Potassium: 3.8 mmol/L (ref 3.5–5.1)

## 2016-06-22 MED ORDER — POTASSIUM CHLORIDE 10 MEQ/100ML IV SOLN
10.0000 meq | INTRAVENOUS | Status: AC
  Administered 2016-06-22 (×3): 10 meq via INTRAVENOUS
  Filled 2016-06-22 (×3): qty 100

## 2016-06-22 MED ORDER — MORPHINE SULFATE (PF) 2 MG/ML IV SOLN
2.0000 mg | Freq: Once | INTRAVENOUS | Status: AC
  Administered 2016-06-22: 2 mg via INTRAVENOUS
  Filled 2016-06-22: qty 1

## 2016-06-22 MED ORDER — POTASSIUM CHLORIDE CRYS ER 20 MEQ PO TBCR
40.0000 meq | EXTENDED_RELEASE_TABLET | ORAL | Status: AC
  Administered 2016-06-22 (×2): 40 meq via ORAL
  Filled 2016-06-22 (×2): qty 2

## 2016-06-22 MED ORDER — HYDRALAZINE HCL 10 MG PO TABS
10.0000 mg | ORAL_TABLET | Freq: Three times a day (TID) | ORAL | Status: DC
  Administered 2016-06-22 – 2016-06-24 (×8): 10 mg via ORAL
  Filled 2016-06-22 (×8): qty 1

## 2016-06-22 MED ORDER — SODIUM CHLORIDE 0.9 % IV SOLN
INTRAVENOUS | Status: DC
  Administered 2016-06-22 – 2016-06-23 (×3): via INTRAVENOUS

## 2016-06-22 MED ORDER — AMLODIPINE BESYLATE 10 MG PO TABS
10.0000 mg | ORAL_TABLET | Freq: Every day | ORAL | Status: DC
  Administered 2016-06-22 – 2016-06-24 (×3): 10 mg via ORAL
  Filled 2016-06-22 (×3): qty 1

## 2016-06-22 MED ORDER — SUMATRIPTAN SUCCINATE 25 MG PO TABS: 25.0000 mg | ORAL_TABLET | ORAL | Status: DC | PRN

## 2016-06-22 MED ORDER — TOPIRAMATE 25 MG PO TABS
50.0000 mg | ORAL_TABLET | Freq: Two times a day (BID) | ORAL | Status: DC
  Administered 2016-06-22 – 2016-06-24 (×5): 50 mg via ORAL
  Filled 2016-06-22 (×5): qty 2

## 2016-06-22 NOTE — Progress Notes (Signed)
[facility]                                                                              Patient Demographics  Julie Wells, is a 45 y.o. female, DOB - 01-Sep-1971, ZOX:[id number]  Admit date - 06/20/2016   Admitting Physician Elwin Mocha, MD  Outpatient Primary MD for the patient is OSEI-BONSU,GEORGE, MD  Outpatient specialists:   LOS - 2  days    Chief Complaint  Patient presents with  . Hematuria       Brief summary   Julie Wells is a very pleasant 45 y.o. female with medical history significant for diabetes diet controlled, hypertension, stroke 2010 with short-term memory deficits, migraine presented with hematuria and right flank pain. About 2 weeks ago she was diagnosed with urinary tract infection by her PCP was started on Keflex. At that time PCP informed patient she had "scant" amounts of blood in her urine. She reports some sort of reaction to Keflex and stopped taking it. Over the last 2 days she developed large amounts of bright red blood in her urine as well as right flank pain. She denied dysuria frequency or urgency. She denied any fever chills nausea or vomiting. She denied any constipation diarrhea melena or bright red blood per rectum. ED workup showed potassium of 2.6, BUN 63, creatinine 4.1, hemoglobin of 5.3, WBCs 18K, platelets 1119 CT renal stone study showed high density 4 cm lesion in the lower pole of the right kidney, recommended noncontrast MRI MRI of the abdomen showed complex mass in the lower pole of the right kidney more consistent with hemorrhage, likely hemorrhagic cyst, however hemorrhagic tumor cannot be completely excluded. Recommended short-term (2 months), follow-up MRI.  Assessment & Plan    Principal Problem:   Acute blood loss anemia with hematuria, hemorrhagic renal cyst - Patient received total 3 units of packed RBCs on 10/2, hemoglobin stable at 8.9 today. Hb 5.3 on admission - Appreciate neurology recommendations,  outpatient follow-up and cystoscopy - Does not appear to be hemolytic component, smear does not show any schistocytes. LDH normal - Per patient right flank pain has now improved. - Anemia workup showed consistent with anemia of chronic disease although iron saturation slightly low at 17%, B12 folate normal - Per patient she has sickle cell trait  Active Problems:   Hematuria - Likely due to #1, follow closely    Hypokalemia - Replaced    Leukocytosis - Likely due to UTI, follow urine cultures, continue IV Rocephin     Hypertension - Currently stable, continue labetalol, hydralazine as needed    Diabetes mellitus without complication (HCC) - Currently stable, continue sliding scale insulin    Acute renal failure superimposed on stage 3 chronic kidney disease (HCC) - Improving with packed RBC transfusion - Baseline ~3 (3.1 on 8/6)     Thrombocytosis (HCC) - Possibly essential thrombocytosis, improving after the packed RBC transfusion - Outpatient follow-up by PCP and hematology referral  - MRI abdomen shows small T2 hypointense spleen consistent with iron deposition or auto infarction (more consistent with her sickle cell history.)  Headache with migraine - Restarted Topamax,  sumatriptan, pain control  Hypokalemia - Replaced aggressively   Code Status:Full code DVT Prophylaxis: SCD's Family Communication: Discussed in detail with the patient, all imaging results, lab results explained to the patient   Disposition Plan   Time Spent in minutes   25 minutes  Procedures:  MRI abd CT renal stone study   Consultants:   Urology, Dr. Pilar Jarvis   Antimicrobials:   IV Rocephin 10/1   Medications  Scheduled Meds: . sodium chloride   Intravenous Once  . amLODipine  10 mg Oral Daily  . cefTRIAXone (ROCEPHIN)  IV  1 g Intravenous Q24H  . hydrALAZINE  10 mg Oral Q8H  . insulin aspart  0-9 Units Subcutaneous TID WC  . iron polysaccharides  150 mg Oral Daily  . labetalol   100 mg Oral BID  . potassium chloride  10 mEq Intravenous Q1 Hr x 3  . potassium chloride  40 mEq Oral Q4H  . pravastatin  20 mg Oral QHS  . sodium chloride flush  3 mL Intravenous Q12H  . topiramate  50 mg Oral BID   Continuous Infusions: . sodium chloride 75 mL/hr at 06/22/16 0800   PRN Meds:.acetaminophen **OR** acetaminophen, hydrALAZINE, HYDROcodone-acetaminophen, ondansetron **OR** ondansetron (ZOFRAN) IV, SUMAtriptan, zolpidem   Antibiotics   Anti-infectives    Start     Dose/Rate Route Frequency Ordered Stop   06/20/16 1730  cefTRIAXone (ROCEPHIN) 1 g in dextrose 5 % 50 mL IVPB  Status:  Discontinued     1 g 100 mL/hr over 30 Minutes Intravenous Every 24 hours 06/20/16 1725 06/20/16 1727   06/20/16 1730  cefTRIAXone (ROCEPHIN) 1 g in dextrose 5 % 50 mL IVPB     1 g 100 mL/hr over 30 Minutes Intravenous Every 24 hours 06/20/16 1727 06/27/16 1729        Subjective:   [name] was seen and examined today.  right-sided flank pain improving, still having hematuria. No fevers. Today complaining of headache with migraine. Patient denies dizziness, chest pain, shortness of breath, abdominal pain, N/V/D/C, new weakness, numbess, tingling.   Objective:   Vitals:   06/21/16 1053 06/21/16 1320 06/21/16 2159 06/22/16 0646  BP: (!) 149/68 (!) 157/73 (!) 176/83 (!) 162/82  Pulse: 65 62 66 64  Resp: 18 16 20 18   Temp: 98.3 F (36.8 C) 98.3 F (36.8 C) 97.8 F (36.6 C) 98.3 F (36.8 C)  TempSrc: Oral Oral Oral Oral  SpO2: 100% 100% 100% 96%  Weight:      Height:        Intake/Output Summary (Last 24 hours) at 06/22/16 1130 Last data filed at 06/22/16 0947  Gross per 24 hour  Intake              664 ml  Output             2450 ml  Net            -1786 ml     Wt Readings from Last 3 Encounters:  06/20/16 86.2 kg (190 lb)  04/25/16 71.7 kg (158 lb)  02/25/15 73.5 kg (162 lb)     Exam  General: Alert and oriented x 3, NAD, Anxious and tearful due to  headache  HEENT:  PERRLA, EOMI  Neck:   Cardiovascular: S1 S2 clear, RRR  Respiratory: Clear to auscultation bilaterally, no wheezing, rales or rhonchi  Gastrointestinal: Soft, nontender, nondistended, + bowel sounds, No CVAT   Ext: no cyanosis clubbing or edema  Neuro: no focal  neurological deficits noted  Skin: No rashes  Psych: Normal affect and demeanor, alert and oriented x3    Data Reviewed:  I have personally reviewed following labs and imaging studies  Micro Results Recent Results (from the past 240 hour(s))  Culture, Urine     Status: None   Collection Time: 06/20/16  5:39 PM  Result Value Ref Range Status   Specimen Description URINE, RANDOM  Final   Special Requests NONE  Final   Culture NO GROWTH  Final   Report Status 06/22/2016 FINAL  Final    Radiology Reports Dg Chest 2 View  Result Date: 06/20/2016 CLINICAL DATA:  Elevated white count. EXAM: CHEST  2 VIEW COMPARISON:  Radiographs of November 17, 2009. FINDINGS: The heart size and mediastinal contours are within normal limits. No pneumothorax or pleural effusion is noted. Stable mild bibasilar scarring is noted. The visualized skeletal structures are unremarkable. IMPRESSION: Stable mild bibasilar scarring. Electronically Signed   By: Marijo Conception, M.D.   On: 06/20/2016 17:40   Mr Abdomen Wo Contrast  Result Date: 06/20/2016 CLINICAL DATA:  Hematuria with right flank pain. Indeterminate right renal mass on CT. History of diabetes, hypertension and stroke. EXAM: MRI ABDOMEN WITHOUT CONTRAST TECHNIQUE: Multiplanar multisequence MR imaging was performed without the administration of intravenous contrast. COMPARISON:  Noncontrast abdominal pelvic CT 06/20/2016 and 04/20/2009. Renal ultrasound 05/22/2014. FINDINGS: Lower chest: Probable left ventricular hypertrophy. The visualized lower chest otherwise appears unremarkable. Hepatobiliary: The liver appears unremarkable with normal signal and no focal  abnormality. No evidence of gallstones, gallbladder wall thickening or biliary dilatation. Pancreas: Unremarkable. No pancreatic ductal dilatation or surrounding inflammatory changes. Spleen: The spleen is small with T2 hypointensity, likely due to iron deposition and/or auto infarction. No focal abnormality. Adrenals/Urinary Tract: Both adrenal glands appear normal. Multiple simple renal cysts are present bilaterally. There are no worrisome left renal lesions. There is a complex mass in the lower pole of the right kidney which was hyperdense on noncontrast CT. This measures approximately 4.6 x 4.6 x 4.9 cm. This lesion is heterogeneous with areas of intrinsic T1 shortening and markedly decreased T2 signal. This suggests hemorrhagic components. No definite fatty components. No evidence of hydronephrosis. The visualized bladder appears unremarkable. Stomach/Bowel: No evidence of bowel wall thickening, distention or surrounding inflammatory change.The appendix appears normal. Vascular/Lymphatic: There are no enlarged abdominal lymph nodes. No significant vascular findings are present. Other: No ascites. Musculoskeletal: No acute or significant osseous findings. IMPRESSION: 1. The complex mass involving the lower pole of the right kidney demonstrates intrinsic T1 shortening and heterogeneous low T2 signal, features which are most consistent with hemorrhage. Overall, this is favored to reflect a hemorrhagic cyst. However, a hemorrhagic tumor cannot be completely excluded by this examination. The restrictions for class 2 gadolinium administration are in the process of being relaxed in the setting of renal failure. Therefore, a short-term (2 month) follow up MR examination to include post-contrast (MultiHance) imaging is suggested to further evaluate this lesion. 2. Both kidneys demonstrate multiple simple cysts. No hydronephrosis. 3. Small T2 hypo intense spleen consistent with iron deposition or auto infarction.  Electronically Signed   By: Richardean Sale M.D.   On: 06/20/2016 19:30   Ct Renal Stone Study  Result Date: 06/20/2016 CLINICAL DATA:  RIGHT flank pain, hematuria EXAM: CT ABDOMEN AND PELVIS WITHOUT CONTRAST TECHNIQUE: Multidetector CT imaging of the abdomen and pelvis was performed following the standard protocol without IV contrast. COMPARISON:  CT 04/20/2009 FINDINGS: Lower chest: Lung bases  are clear. Hepatobiliary: No focal hepatic lesion. No biliary duct dilatation. Gallbladder is normal. Common bile duct is normal. Pancreas: Pancreas is normal. No ductal dilatation. No pancreatic inflammation. Spleen: Normal spleen Adrenals/urinary tract: Adrenal glands normal. Hyperdense lesion in the inferior pole of the RIGHT kidney is ill-defined measuring approximately 4.0 x 4.0 cm (image 36, series 2). Cyst in upper pole of the RIGHT kidney. RIGHT ureter appears normal. There are low-density lesions in the LEFT kidney which cannot be fully characterize. LEFT ureter normal. The bladder is normal. Stomach/Bowel: Stomach, small bowel, appendix, and cecum are normal. The colon and rectosigmoid colon are normal. Vascular/Lymphatic: Abdominal aorta is normal caliber. There is no retroperitoneal or periportal lymphadenopathy. No pelvic lymphadenopathy. Reproductive: Uterus and ovaries normal. Other: No free fluid. Musculoskeletal: No aggressive osseous lesion. IMPRESSION: 1. High-density 4 cm lesion in lower pole of the RIGHT kidney with differential including renal neoplasm, renal hemorrhage, or renal infection. If patient cannot receive IV contrast, a noncontrast MRI may provide increased specificity of this lesion. 2. No obstructive uropathy. 3. Normal bladder. Electronically Signed   By: Suzy Bouchard M.D.   On: 06/20/2016 12:39    Lab Data:  CBC:  Recent Labs Lab 06/20/16 1125 06/21/16 0734 06/21/16 1555 06/22/16 0449  WBC 18.0* 14.8*  --  15.6*  HGB 5.3* 7.9* 9.1* 8.9*  HCT 16.0* 23.3* 26.6* 26.3*   MCV 80.8 82.6  --  84.0  PLT 1,119* 912* 913* 474*   Basic Metabolic Panel:  Recent Labs Lab 06/20/16 1125 06/21/16 0734 06/22/16 0449  NA 140 143 139  K 2.6* 3.3* 2.9*  CL 108 115* 113*  CO2 20* 18* 17*  GLUCOSE 93 89 82  BUN 63* 54* 53*  CREATININE 4.13* 3.76* 3.64*  CALCIUM 9.0 9.3 9.4  MG 2.4  --  2.2  PHOS 5.4*  --   --    GFR: Estimated Creatinine Clearance: 19.5 mL/min (by C-G formula based on SCr of 3.64 mg/dL (H)). Liver Function Tests:  Recent Labs Lab 06/20/16 1125  AST 11*  ALT 12*  ALKPHOS 71  BILITOT 0.4  PROT 8.7*  ALBUMIN 3.5   No results for input(s): LIPASE, AMYLASE in the last 168 hours. No results for input(s): AMMONIA in the last 168 hours. Coagulation Profile:  Recent Labs Lab 06/20/16 1125 06/21/16 1555  INR 1.20 1.16   Cardiac Enzymes: No results for input(s): CKTOTAL, CKMB, CKMBINDEX, TROPONINI in the last 168 hours. BNP (last 3 results) No results for input(s): PROBNP in the last 8760 hours. HbA1C:  Recent Labs  06/20/16 1705  HGBA1C 4.5*   CBG:  Recent Labs Lab 06/21/16 0824 06/21/16 1147 06/21/16 1811 06/21/16 2213 06/22/16 0749  GLUCAP 81 106* 87 114* 86   Lipid Profile: No results for input(s): CHOL, HDL, LDLCALC, TRIG, CHOLHDL, LDLDIRECT in the last 72 hours. Thyroid Function Tests: No results for input(s): TSH, T4TOTAL, FREET4, T3FREE, THYROIDAB in the last 72 hours. Anemia Panel:  Recent Labs  06/20/16 1125 06/20/16 1236 06/21/16 1555  VITAMINB12  --  1,035*  --   FOLATE  --  30.9  --   FERRITIN  --  476*  --   TIBC  --  315  --   IRON  --  55  --   RETICCTPCT 6.7*  --  5.0*   Urine analysis:    Component Value Date/Time   COLORURINE RED (A) 06/20/2016 1140   APPEARANCEUR TURBID (A) 06/20/2016 1140   LABSPEC 1.011 06/20/2016 1140  PHURINE 5.5 06/20/2016 1140   GLUCOSEU NEGATIVE 06/20/2016 1140   HGBUR LARGE (A) 06/20/2016 1140   BILIRUBINUR NEGATIVE 06/20/2016 1140   KETONESUR 15 (A)  06/20/2016 1140   PROTEINUR 100 (A) 06/20/2016 1140   UROBILINOGEN 0.2 02/13/2015 1823   NITRITE NEGATIVE 06/20/2016 1140   LEUKOCYTESUR MODERATE (A) 06/20/2016 1140     RAI,RIPUDEEP M.D. [facility] 06/22/2016, 11:30 AM  Pager: [phone] Between 7am to 7pm - call Pager - [phone]  After 7pm go to [email or web address] - password [email or web address]  Call night coverage person covering after 7pm

## 2016-06-22 NOTE — Care Management Note (Signed)
Case Management Note  Patient Details  Name: Julie Wells MRN: [id number] Date of Birth: 29-Aug-1971  Subjective/Objective:             Presents with hematuria, hx of  sickle cell trait, anemia of chronic disease, and short-term memory. From home with husband. Pt states independent with ADL's , no DME usage PTA.     PCP: Benito Mccreedy  Action/Plan: Return to home when medically stable. CM to f/u with disposition needs.   Expected Discharge Date:                  Expected Discharge Plan:  Home/Self Care  In-House Referral:     Discharge planning Services  CM Consult  Post Acute Care Choice:    Choice offered to:     DME Arranged:    DME Agency:     HH Arranged:    [place] Agency:     Status of Service:  Completed, signed off  If discussed at [employer] of Stay Meetings, dates discussed:    Additional Comments:  Sharin Mons, RN 06/22/2016, 11:13 AM

## 2016-06-23 ENCOUNTER — Encounter (HOSPITAL_COMMUNITY): Payer: Self-pay | Admitting: General Practice

## 2016-06-23 DIAGNOSIS — D473 Essential (hemorrhagic) thrombocythemia: Secondary | ICD-10-CM

## 2016-06-23 LAB — BASIC METABOLIC PANEL
Anion gap: 12 (ref 5–15)
BUN: 44 mg/dL — ABNORMAL HIGH (ref 6–20)
CO2: 16 mmol/L — ABNORMAL LOW (ref 22–32)
Calcium: 9.3 mg/dL (ref 8.9–10.3)
Chloride: 116 mmol/L — ABNORMAL HIGH (ref 101–111)
Creatinine, Ser: 3.3 mg/dL — ABNORMAL HIGH (ref 0.44–1.00)
GFR calc Af Amer: 18 mL/min — ABNORMAL LOW (ref >60)
GFR calc non Af Amer: 16 mL/min — ABNORMAL LOW (ref >60)
Glucose, Bld: 77 mg/dL (ref 65–99)
Potassium: 3.6 mmol/L (ref 3.5–5.1)
Sodium: 144 mmol/L (ref 135–145)

## 2016-06-23 LAB — CBC
HCT: 28.2 % — ABNORMAL LOW (ref 36.0–46.0)
Hemoglobin: 9.4 g/dL — ABNORMAL LOW (ref 12.0–15.0)
MCH: 28.7 pg (ref 26.0–34.0)
MCHC: 33.3 g/dL (ref 30.0–36.0)
MCV: 86.2 fL (ref 78.0–100.0)
Platelets: 835 10*3/uL — ABNORMAL HIGH (ref 150–400)
RBC: 3.27 MIL/uL — ABNORMAL LOW (ref 3.87–5.11)
RDW: 20.1 % — ABNORMAL HIGH (ref 11.5–15.5)
WBC: 15.7 10*3/uL — ABNORMAL HIGH (ref 4.0–10.5)

## 2016-06-23 LAB — GLUCOSE, CAPILLARY
Glucose-Capillary: 102 mg/dL — ABNORMAL HIGH (ref 65–99)
Glucose-Capillary: 100 mg/dL — ABNORMAL HIGH (ref 65–99)
Glucose-Capillary: 94 mg/dL (ref 65–99)
Glucose-Capillary: 85 mg/dL (ref 65–99)

## 2016-06-23 NOTE — Progress Notes (Signed)
PROGRESS NOTE    Julie Wells  ZOX:[id number] DOB: April 03, 1971 DOA: 06/20/2016 PCP: Benito Mccreedy, MD    Brief Narrative:  Julie Wells a very pleasant 45 y.o.femalewith medical history significant for diabetes diet controlled, hypertension, stroke 2010 with short-term memory deficits, migraine presented with hematuria and right flank pain. About 2 weeks ago she was diagnosed with urinary tract infection by her PCP was started on Keflex. At that time PCP informed patient she had "scant" amounts of blood in her urine. She reports some sort of reaction to Keflex and stopped taking it. Over the last 2 days she developed large amounts of bright red blood in her urine as well as right flank pain. She denied dysuria frequency or urgency. She denied any fever chills nausea or vomiting. She denied any constipation diarrhea melena or bright red blood per rectum. ED workup showed potassium of 2.6, BUN 63, creatinine 4.1, hemoglobin of 5.3, WBCs 18K, platelets 1119 CT renal stone study showed high density 4 cm lesion in the lower pole of the right kidney, recommended noncontrast MRI MRI of the abdomen showed complex mass in the lower pole of the right kidney more consistent with hemorrhage, likely hemorrhagic cyst, however hemorrhagic tumor cannot be completely excluded. Recommended short-term (2 months), follow-up MRI.   Assessment & Plan:   Principal Problem:   Acute blood loss anemia Active Problems:   Hematuria   Hypokalemia   Leukocytosis   Hypertension   Diabetes mellitus without complication (HCC)   Acute renal failure superimposed on stage 3 chronic kidney disease (HCC)   Thrombocytosis (HCC)   Abnormal CT scan   Acute blood loss anemia with hematuria, hemorrhagic renal cyst - Patient received total 3 units of packed RBCs on 10/2, hemoglobin stable at 9.4 today. Hb 5.3 on admission - Appreciate urology recommendations, outpatient follow-up and cystoscopy - Does not appear to be  hemolytic component, smear does not show any schistocytes. LDH normal - Per patient right flank pain has now improved. - Anemia workup showed consistent with anemia of chronic disease although iron saturation slightly low at 17%, B12 folate normal - Per patient she has sickle cell trait - will redraw H/H in am- if stable can discharge     Hematuria - Likely due to #1, follow closely - significant amount of hematuria in hat this morning    Hypokalemia - Replaced    Leukocytosis - Likely due to UTI, follow urine cultures, continue IV Rocephin     Hypertension - Currently stable, continue labetalol, hydralazine as needed    Diabetes mellitus without complication (HCC) - Currently stable, continue sliding scale insulin    Acute renal failure superimposed on stage 3 chronic kidney disease (HCC) - Improving with packed RBC transfusion - Baseline ~3 (3.1 on 8/6)  - Cr at 3.3 today - redraw BMP in am    Thrombocytosis ([address]) - Possibly essential thrombocytosis, improving after the packed RBC transfusion - Outpatient follow-up by PCP and hematology referral  - MRI abdomen shows small T2 hypointense spleen consistent with iron deposition or auto infarction (more consistent with her sickle cell history.)  Headache with migraine - Restarted Topamax, sumatriptan, pain control  Hypokalemia - Replaced aggressively - redraw BMP in am   Code Status:Full code DVT Prophylaxis: SCD's Family Communication: Discussed in detail with the patient, all imaging results, lab results explained to the patient; also talked with patient's husband on the phone Disposition Plan: likely discharge in the next 24 hours pending stability of H/H and  Creatinine    Consultants:   Urology  Procedures:   MRI abdomen  CT renal stone study  Antimicrobials:   IV Rocephin 10/1>   Subjective: Patient seen and examined.  She voices she does not know what is going on.  Says doctor has been  talking to her husband.  Called her husband for me to discuss plan of care.  Patient still urinating significant amount of blood.  Denies clots but says urine is still a significant red hue.  Denies dizziness, lightheadedness, weakness.    Objective: Vitals:   06/23/16 0756 06/23/16 1026 06/23/16 1336 06/23/16 1439  BP: (!) 170/62 (!) 162/79 135/77 (!) 153/73  Pulse: (!) 56 62 65 (!) 59  Resp: 16 16  18   Temp: 97.8 F (36.6 C) 97.8 F (36.6 C) 98.1 F (36.7 C) 98.3 F (36.8 C)  TempSrc: Oral  Oral Oral  SpO2: 99% 100% 100% 98%  Weight:      Height:        Intake/Output Summary (Last 24 hours) at 06/23/16 1659 Last data filed at 06/23/16 1452  Gross per 24 hour  Intake          1458.75 ml  Output             2250 ml  Net          -791.25 ml   Filed Weights   06/20/16 1003  Weight: 86.2 kg (190 lb)    Examination:  General exam: Appears calm and comfortable  Respiratory system: Clear to auscultation. Respiratory effort normal. Cardiovascular system: S1 & S2 heard, RRR. No JVD, murmurs, rubs, gallops or clicks. No pedal edema. Gastrointestinal system: Abdomen is nondistended, soft and nontender. No organomegaly or masses felt. Normal bowel sounds heard. Central nervous system: Alert and oriented. No focal neurological deficits. Extremities: Symmetric 5 x 5 power. Skin: No rashes, lesions or ulcers Psychiatry: Judgement and insight appear normal. Mood & affect appropriate.     Data Reviewed: I have personally reviewed following labs and imaging studies  CBC:  Recent Labs Lab 06/20/16 1125 06/21/16 0734 06/21/16 1555 06/22/16 0449 06/23/16 0535  WBC 18.0* 14.8*  --  15.6* 15.7*  HGB 5.3* 7.9* 9.1* 8.9* 9.4*  HCT 16.0* 23.3* 26.6* 26.3* 28.2*  MCV 80.8 82.6  --  84.0 86.2  PLT 1,119* 912* 913* 843* 382*   Basic Metabolic Panel:  Recent Labs Lab 06/20/16 1125 06/21/16 0734 06/22/16 0449 06/22/16 1222 06/23/16 0535  NA 140 143 139  --  144  K 2.6* 3.3*  2.9* 3.8 3.6  CL 108 115* 113*  --  116*  CO2 20* 18* 17*  --  16*  GLUCOSE 93 89 82  --  77  BUN 63* 54* 53*  --  44*  CREATININE 4.13* 3.76* 3.64*  --  3.30*  CALCIUM 9.0 9.3 9.4  --  9.3  MG 2.4  --  2.2  --   --   PHOS 5.4*  --   --   --   --    GFR: Estimated Creatinine Clearance: 21.5 mL/min (by C-G formula based on SCr of 3.3 mg/dL (H)). Liver Function Tests:  Recent Labs Lab 06/20/16 1125  AST 11*  ALT 12*  ALKPHOS 71  BILITOT 0.4  PROT 8.7*  ALBUMIN 3.5   No results for input(s): LIPASE, AMYLASE in the last 168 hours. No results for input(s): AMMONIA in the last 168 hours. Coagulation Profile:  Recent Labs Lab 06/20/16 1125 06/21/16 1555  INR 1.20 1.16   Cardiac Enzymes: No results for input(s): CKTOTAL, CKMB, CKMBINDEX, TROPONINI in the last 168 hours. BNP (last 3 results) No results for input(s): PROBNP in the last 8760 hours. HbA1C:  Recent Labs  06/20/16 1705  HGBA1C 4.5*   CBG:  Recent Labs Lab 06/22/16 1736 06/22/16 2211 06/23/16 0755 06/23/16 1147 06/23/16 1635  GLUCAP 85 100* 102* 100* 94   Lipid Profile: No results for input(s): CHOL, HDL, LDLCALC, TRIG, CHOLHDL, LDLDIRECT in the last 72 hours. Thyroid Function Tests: No results for input(s): TSH, T4TOTAL, FREET4, T3FREE, THYROIDAB in the last 72 hours. Anemia Panel:  Recent Labs  06/21/16 1555  RETICCTPCT 5.0*   Sepsis Labs: No results for input(s): PROCALCITON, LATICACIDVEN in the last 168 hours.  Recent Results (from the past 240 hour(s))  Culture, Urine     Status: None   Collection Time: 06/20/16  5:39 PM  Result Value Ref Range Status   Specimen Description URINE, RANDOM  Final   Special Requests NONE  Final   Culture NO GROWTH  Final   Report Status 06/22/2016 FINAL  Final         Radiology Studies: No results found.      Scheduled Meds: . sodium chloride   Intravenous Once  . amLODipine  10 mg Oral Daily  . cefTRIAXone (ROCEPHIN)  IV  1 g  Intravenous Q24H  . hydrALAZINE  10 mg Oral Q8H  . insulin aspart  0-9 Units Subcutaneous TID WC  . iron polysaccharides  150 mg Oral Daily  . labetalol  100 mg Oral BID  . pravastatin  20 mg Oral QHS  . sodium chloride flush  3 mL Intravenous Q12H  . topiramate  50 mg Oral BID   Continuous Infusions: . sodium chloride 75 mL/hr at 06/23/16 0555     LOS: 3 days    Time spent: 35 minutes    Newman Pies, MD [facility] Pager [phone]  If 7PM-7AM, please contact night-coverage [email or web address] Password [email or web address] 06/23/2016, 4:59 PM

## 2016-06-23 NOTE — Care Management Important Message (Signed)
Important Message  Patient Details  Name: Julie Wells MRN: [id number] Date of Birth: 04-12-1971   Medicare Important Message Given:  Yes    Christyan Reger Abena 06/23/2016, 11:11 AM

## 2016-06-24 DIAGNOSIS — N179 Acute kidney failure, unspecified: Secondary | ICD-10-CM

## 2016-06-24 DIAGNOSIS — D72829 Elevated white blood cell count, unspecified: Secondary | ICD-10-CM

## 2016-06-24 DIAGNOSIS — N183 Chronic kidney disease, stage 3 (moderate): Secondary | ICD-10-CM

## 2016-06-24 LAB — TYPE AND SCREEN
ABO/RH(D): A POS
Antibody Screen: NEGATIVE
Unit division: 0
Unit division: 0
Unit division: 0
Unit division: 0

## 2016-06-24 LAB — CBC
HCT: 27.6 % — ABNORMAL LOW (ref 36.0–46.0)
Hemoglobin: 9.2 g/dL — ABNORMAL LOW (ref 12.0–15.0)
MCH: 28.7 pg (ref 26.0–34.0)
MCHC: 33.3 g/dL (ref 30.0–36.0)
MCV: 86 fL (ref 78.0–100.0)
Platelets: 780 10*3/uL — ABNORMAL HIGH (ref 150–400)
RBC: 3.21 MIL/uL — ABNORMAL LOW (ref 3.87–5.11)
RDW: 20.6 % — ABNORMAL HIGH (ref 11.5–15.5)
WBC: 14 10*3/uL — ABNORMAL HIGH (ref 4.0–10.5)

## 2016-06-24 LAB — BASIC METABOLIC PANEL
Anion gap: 16 — ABNORMAL HIGH (ref 5–15)
BUN: 39 mg/dL — ABNORMAL HIGH (ref 6–20)
CO2: 18 mmol/L — ABNORMAL LOW (ref 22–32)
Calcium: 9.2 mg/dL (ref 8.9–10.3)
Chloride: 108 mmol/L (ref 101–111)
Creatinine, Ser: 3.14 mg/dL — ABNORMAL HIGH (ref 0.44–1.00)
GFR calc Af Amer: 19 mL/min — ABNORMAL LOW (ref >60)
GFR calc non Af Amer: 17 mL/min — ABNORMAL LOW (ref >60)
Glucose, Bld: 76 mg/dL (ref 65–99)
Potassium: 3.3 mmol/L — ABNORMAL LOW (ref 3.5–5.1)
Sodium: 142 mmol/L (ref 135–145)

## 2016-06-24 LAB — GLUCOSE, CAPILLARY
Glucose-Capillary: 82 mg/dL (ref 65–99)
Glucose-Capillary: 107 mg/dL — ABNORMAL HIGH (ref 65–99)

## 2016-06-24 NOTE — Progress Notes (Signed)
Discharge education provided to patient with verbalized understanding, CCMD notfied, IV removed.  Betha Loa Virjean Boman, RN

## 2016-06-24 NOTE — Discharge Summary (Signed)
Physician Discharge Summary  Julie Wells DVV:[id number] DOB: [birth year] DOA: 06/20/2016  PCP: Benito Mccreedy, MD  Admit date: 06/20/2016 Discharge date: 06/24/2016  Admitted From: Home Disposition:  Home  Recommendations for Outpatient Follow-up:  1. Follow up with PCP in 1-2 weeks 2. Please schedule follow up with urologist at time of discharge 3. Please ask your primary care physician about referral to Hematology 4. Please obtain BMP/CBC in one week  Home Health:No Equipment/Devices: None  Discharge Condition: Stable CODE STATUS:Full Code Diet recommendation: Heart healthy carb modified   Brief/Interim Summary: Julie Wells a very pleasant 45 y.o.femalewith medical history significant for diabetes diet controlled, hypertension, stroke 2010 with short-term memory deficits, migraine presented with hematuria and right flank pain. About 2 weeks ago she was diagnosed with urinary tract infection by her PCP was started on Keflex. At that time PCP informed patient she had "scant" amounts of blood in her urine. She reports some sort of reaction to Keflex and stopped taking it. Over the last 2 days she developed large amounts of bright red blood in her urine as well as right flank pain. She denied dysuria frequency or urgency. She denied any fever chills nausea or vomiting. She denied any constipation diarrhea melena or bright red blood per rectum. ED workup showed potassium of 2.6, BUN 63, creatinine 4.1, hemoglobin of 5.3, WBCs 18K, platelets 1119 CT renal stone study showed high density 4 cm lesion in the lower pole of the right kidney, recommended noncontrast MRI. MRI of the abdomen showed complex mass in the lower pole of the right kidney more consistent with hemorrhage, likely hemorrhagic cyst, however hemorrhagic tumor cannot be completely excluded. Recommended short-term (2 months), follow-up MRI.  Urology to see patient outpatient and finish workup and patient encouraged to see her  primary doctor to discuss hospitalization within the next week as well as to undergo blood work to ensure no blood loss.  Discharge Diagnoses:  Principal Problem:   Acute blood loss anemia Active Problems:   Hematuria   Hypokalemia   Leukocytosis   Hypertension   Diabetes mellitus without complication (HCC)   Acute renal failure superimposed on stage 3 chronic kidney disease (HCC)   Thrombocytosis (HCC)   Abnormal CT scan    Discharge Instructions  Discharge Instructions    Call MD for:  difficulty breathing, headache or visual disturbances    Complete by:  As directed    Call MD for:  extreme fatigue    Complete by:  As directed    Call MD for:  persistant dizziness or light-headedness    Complete by:  As directed    Call MD for:  persistant nausea and vomiting    Complete by:  As directed    Call MD for:  severe uncontrolled pain    Complete by:  As directed    Call MD for:  temperature >100.4    Complete by:  As directed    Diet - low sodium heart healthy    Complete by:  As directed    Increase activity slowly    Complete by:  As directed        Medication List    STOP taking these medications   cephALEXin 500 MG capsule Commonly known as:  KEFLEX   ergocalciferol 50000 units capsule Commonly known as:  VITAMIN D2   hydrALAZINE 100 MG tablet Commonly known as:  APRESOLINE   iron polysaccharides 150 MG capsule Commonly known as:  NIFEREX   SUMAtriptan 25 MG  tablet Commonly known as:  IMITREX     TAKE these medications   acetaminophen 500 MG tablet Commonly known as:  TYLENOL Take 500-1,000 mg by mouth every 6 (six) hours as needed for mild pain or headache.   amLODipine-benazepril 10-40 MG capsule Commonly known as:  LOTREL Take 1 capsule by mouth daily.   hydrochlorothiazide 25 MG tablet Commonly known as:  HYDRODIURIL Take 25 mg by mouth daily.   labetalol 100 MG tablet Commonly known as:  NORMODYNE Take 100 mg by mouth 2 (two) times  daily.   ONE-A-DAY WOMENS 50+ ADVANTAGE Tabs Take 1 tablet by mouth daily.   pravastatin 20 MG tablet Commonly known as:  PRAVACHOL Take 20 mg by mouth at bedtime.   topiramate 50 MG tablet Commonly known as:  TOPAMAX Take 50 mg by mouth 2 (two) times daily.      Follow-up Information    Nickie Retort, MD .   Specialty:  Urology Contact information: [place] [address] [phone]        Benito Mccreedy, MD. Schedule an appointment as soon as possible for a visit in 1 week(s).   Specialty:  Internal Medicine Contact information: [address] [place] [address] [phone]          Allergies  Allergen Reactions  . Levaquin [Levofloxacin In D5w] Hives and Swelling  . Other Swelling    Unnamed med caused swelling of the lips (patient cannot recall the indication or name)    Consultations:  Urology   Procedures/Studies: Dg Chest 2 View  Result Date: 06/20/2016 CLINICAL DATA:  Elevated white count. EXAM: CHEST  2 VIEW COMPARISON:  Radiographs of November 17, 2009. FINDINGS: The heart size and mediastinal contours are within normal limits. No pneumothorax or pleural effusion is noted. Stable mild bibasilar scarring is noted. The visualized skeletal structures are unremarkable. IMPRESSION: Stable mild bibasilar scarring. Electronically Signed   By: Marijo Conception, M.D.   On: 06/20/2016 17:40   Mr Abdomen Wo Contrast  Result Date: 06/20/2016 CLINICAL DATA:  Hematuria with right flank pain. Indeterminate right renal mass on CT. History of diabetes, hypertension and stroke. EXAM: MRI ABDOMEN WITHOUT CONTRAST TECHNIQUE: Multiplanar multisequence MR imaging was performed without the administration of intravenous contrast. COMPARISON:  Noncontrast abdominal pelvic CT 06/20/2016 and 04/20/2009. Renal ultrasound 05/22/2014. FINDINGS: Lower chest: Probable left ventricular hypertrophy. The visualized lower chest otherwise appears  unremarkable. Hepatobiliary: The liver appears unremarkable with normal signal and no focal abnormality. No evidence of gallstones, gallbladder wall thickening or biliary dilatation. Pancreas: Unremarkable. No pancreatic ductal dilatation or surrounding inflammatory changes. Spleen: The spleen is small with T2 hypointensity, likely due to iron deposition and/or auto infarction. No focal abnormality. Adrenals/Urinary Tract: Both adrenal glands appear normal. Multiple simple renal cysts are present bilaterally. There are no worrisome left renal lesions. There is a complex mass in the lower pole of the right kidney which was hyperdense on noncontrast CT. This measures approximately 4.6 x 4.6 x 4.9 cm. This lesion is heterogeneous with areas of intrinsic T1 shortening and markedly decreased T2 signal. This suggests hemorrhagic components. No definite fatty components. No evidence of hydronephrosis. The visualized bladder appears unremarkable. Stomach/Bowel: No evidence of bowel wall thickening, distention or surrounding inflammatory change.The appendix appears normal. Vascular/Lymphatic: There are no enlarged abdominal lymph nodes. No significant vascular findings are present. Other: No ascites. Musculoskeletal: No acute or significant osseous findings. IMPRESSION: 1. The complex mass involving the lower pole of  the right kidney demonstrates intrinsic T1 shortening and heterogeneous low T2 signal, features which are most consistent with hemorrhage. Overall, this is favored to reflect a hemorrhagic cyst. However, a hemorrhagic tumor cannot be completely excluded by this examination. The restrictions for class 2 gadolinium administration are in the process of being relaxed in the setting of renal failure. Therefore, a short-term (2 month) follow up MR examination to include post-contrast (MultiHance) imaging is suggested to further evaluate this lesion. 2. Both kidneys demonstrate multiple simple cysts. No hydronephrosis.  3. Small T2 hypo intense spleen consistent with iron deposition or auto infarction. Electronically Signed   By: Richardean Sale M.D.   On: 06/20/2016 19:30   Ct Renal Stone Study  Result Date: 06/20/2016 CLINICAL DATA:  RIGHT flank pain, hematuria EXAM: CT ABDOMEN AND PELVIS WITHOUT CONTRAST TECHNIQUE: Multidetector CT imaging of the abdomen and pelvis was performed following the standard protocol without IV contrast. COMPARISON:  CT 04/20/2009 FINDINGS: Lower chest: Lung bases are clear. Hepatobiliary: No focal hepatic lesion. No biliary duct dilatation. Gallbladder is normal. Common bile duct is normal. Pancreas: Pancreas is normal. No ductal dilatation. No pancreatic inflammation. Spleen: Normal spleen Adrenals/urinary tract: Adrenal glands normal. Hyperdense lesion in the inferior pole of the RIGHT kidney is ill-defined measuring approximately 4.0 x 4.0 cm (image 36, series 2). Cyst in upper pole of the RIGHT kidney. RIGHT ureter appears normal. There are low-density lesions in the LEFT kidney which cannot be fully characterize. LEFT ureter normal. The bladder is normal. Stomach/Bowel: Stomach, small bowel, appendix, and cecum are normal. The colon and rectosigmoid colon are normal. Vascular/Lymphatic: Abdominal aorta is normal caliber. There is no retroperitoneal or periportal lymphadenopathy. No pelvic lymphadenopathy. Reproductive: Uterus and ovaries normal. Other: No free fluid. Musculoskeletal: No aggressive osseous lesion. IMPRESSION: 1. High-density 4 cm lesion in lower pole of the RIGHT kidney with differential including renal neoplasm, renal hemorrhage, or renal infection. If patient cannot receive IV contrast, a noncontrast MRI may provide increased specificity of this lesion. 2. No obstructive uropathy. 3. Normal bladder. Electronically Signed   By: Suzy Bouchard M.D.   On: 06/20/2016 12:39       Subjective: Patient feels well.  She voices she feels ready to go home.  She is asking about  whether or not she needs antibiotics at time of discharge.  She is a poor historian and asked that I talk to her husband on the phone and tell him about discharge planning.  Encouraged patient and her husband to monitor urine and if she develops similar symptoms she had before to come back for re- evaluation.  Discharge Exam: Vitals:   06/24/16 1006 06/24/16 1452  BP: (!) 150/72 134/75  Pulse: 70 72  Resp:  (!) 24  Temp:  99 F (37.2 C)   Vitals:   06/23/16 2118 06/24/16 0521 06/24/16 1006 06/24/16 1452  BP: (!) 154/69 (!) 157/76 (!) 150/72 134/75  Pulse: 62 73 70 72  Resp: 16 20  (!) 24  Temp: 98.6 F (37 C) 98.2 F (36.8 C)  99 F (37.2 C)  TempSrc: Oral Oral  Oral  SpO2: 100% 99% 100% 100%  Weight:      Height:        General: Pt is alert, awake, not in acute distress Cardiovascular: RRR, S1/S2 +, no rubs, no gallops Respiratory: CTA bilaterally, no wheezing, no rhonchi Abdominal: Soft, NT, ND, bowel sounds + Extremities: no edema, no cyanosis    The results of significant diagnostics from  this hospitalization (including imaging, microbiology, ancillary and laboratory) are listed below for reference.     Microbiology: Recent Results (from the past 240 hour(s))  Culture, Urine     Status: None   Collection Time: 06/20/16  5:39 PM  Result Value Ref Range Status   Specimen Description URINE, RANDOM  Final   Special Requests NONE  Final   Culture NO GROWTH  Final   Report Status 06/22/2016 FINAL  Final     Labs: BNP (last 3 results) No results for input(s): BNP in the last 8760 hours. Basic Metabolic Panel:  Recent Labs Lab 06/20/16 1125 06/21/16 0734 06/22/16 0449 06/22/16 1222 06/23/16 0535 06/24/16 0535  NA 140 143 139  --  144 142  K 2.6* 3.3* 2.9* 3.8 3.6 3.3*  CL 108 115* 113*  --  116* 108  CO2 20* 18* 17*  --  16* 18*  GLUCOSE 93 89 82  --  77 76  BUN 63* 54* 53*  --  44* 39*  CREATININE 4.13* 3.76* 3.64*  --  3.30* 3.14*  CALCIUM 9.0 9.3 9.4   --  9.3 9.2  MG 2.4  --  2.2  --   --   --   PHOS 5.4*  --   --   --   --   --    Liver Function Tests:  Recent Labs Lab 06/20/16 1125  AST 11*  ALT 12*  ALKPHOS 71  BILITOT 0.4  PROT 8.7*  ALBUMIN 3.5   No results for input(s): LIPASE, AMYLASE in the last 168 hours. No results for input(s): AMMONIA in the last 168 hours. CBC:  Recent Labs Lab 06/20/16 1125 06/21/16 0734 06/21/16 1555 06/22/16 0449 06/23/16 0535 06/24/16 0535  WBC 18.0* 14.8*  --  15.6* 15.7* 14.0*  HGB 5.3* 7.9* 9.1* 8.9* 9.4* 9.2*  HCT 16.0* 23.3* 26.6* 26.3* 28.2* 27.6*  MCV 80.8 82.6  --  84.0 86.2 86.0  PLT 1,119* 912* 913* 843* 835* 780*   Cardiac Enzymes: No results for input(s): CKTOTAL, CKMB, CKMBINDEX, TROPONINI in the last 168 hours. BNP: Invalid input(s): POCBNP CBG:  Recent Labs Lab 06/23/16 1147 06/23/16 1635 06/23/16 2114 06/24/16 0800 06/24/16 1208  GLUCAP 100* 94 85 82 107*   D-Dimer  Recent Labs  06/21/16 1555  DDIMER 2.59*   Hgb A1c No results for input(s): HGBA1C in the last 72 hours. Lipid Profile No results for input(s): CHOL, HDL, LDLCALC, TRIG, CHOLHDL, LDLDIRECT in the last 72 hours. Thyroid function studies No results for input(s): TSH, T4TOTAL, T3FREE, THYROIDAB in the last 72 hours.  Invalid input(s): FREET3 Anemia work up  Recent Labs  06/21/16 1555  RETICCTPCT 5.0*   Urinalysis    Component Value Date/Time   COLORURINE RED (A) 06/20/2016 1140   APPEARANCEUR TURBID (A) 06/20/2016 1140   LABSPEC 1.011 06/20/2016 1140   PHURINE 5.5 06/20/2016 1140   GLUCOSEU NEGATIVE 06/20/2016 1140   HGBUR LARGE (A) 06/20/2016 1140   BILIRUBINUR NEGATIVE 06/20/2016 1140   KETONESUR 15 (A) 06/20/2016 1140   PROTEINUR 100 (A) 06/20/2016 1140   UROBILINOGEN 0.2 02/13/2015 1823   NITRITE NEGATIVE 06/20/2016 1140   LEUKOCYTESUR MODERATE (A) 06/20/2016 1140   Sepsis Labs Invalid input(s): PROCALCITONIN,  WBC,  LACTICIDVEN Microbiology Recent Results (from  the past 240 hour(s))  Culture, Urine     Status: None   Collection Time: 06/20/16  5:39 PM  Result Value Ref Range Status   Specimen Description URINE, RANDOM  Final  Special Requests NONE  Final   Culture NO GROWTH  Final   Report Status 06/22/2016 FINAL  Final     Time coordinating discharge: Less than 30 minutes  SIGNED:   Newman Pies, MD  [facility] 06/24/2016, 3:21 PM Pager [phone] If 7PM-7AM, please contact night-coverage [email or web address] Password [email or web address]

## 2016-07-05 DIAGNOSIS — E119 Type 2 diabetes mellitus without complications: Secondary | ICD-10-CM | POA: Diagnosis not present

## 2016-07-05 DIAGNOSIS — G894 Chronic pain syndrome: Secondary | ICD-10-CM | POA: Diagnosis not present

## 2016-07-05 DIAGNOSIS — Z5181 Encounter for therapeutic drug level monitoring: Secondary | ICD-10-CM | POA: Diagnosis not present

## 2016-07-05 DIAGNOSIS — N179 Acute kidney failure, unspecified: Secondary | ICD-10-CM | POA: Diagnosis not present

## 2016-07-05 DIAGNOSIS — G43909 Migraine, unspecified, not intractable, without status migrainosus: Secondary | ICD-10-CM | POA: Diagnosis not present

## 2016-07-05 DIAGNOSIS — N183 Chronic kidney disease, stage 3 (moderate): Secondary | ICD-10-CM | POA: Diagnosis not present

## 2016-07-05 DIAGNOSIS — E785 Hyperlipidemia, unspecified: Secondary | ICD-10-CM | POA: Diagnosis not present

## 2016-07-05 DIAGNOSIS — I10 Essential (primary) hypertension: Secondary | ICD-10-CM | POA: Diagnosis not present

## 2016-07-05 DIAGNOSIS — E876 Hypokalemia: Secondary | ICD-10-CM | POA: Diagnosis not present

## 2016-07-14 ENCOUNTER — Other Ambulatory Visit: Payer: Self-pay | Admitting: Urology

## 2016-07-14 DIAGNOSIS — N281 Cyst of kidney, acquired: Secondary | ICD-10-CM | POA: Diagnosis not present

## 2016-07-14 DIAGNOSIS — D3000 Benign neoplasm of unspecified kidney: Secondary | ICD-10-CM

## 2016-07-14 DIAGNOSIS — R310 Gross hematuria: Secondary | ICD-10-CM | POA: Diagnosis not present

## 2016-07-19 DIAGNOSIS — G894 Chronic pain syndrome: Secondary | ICD-10-CM | POA: Diagnosis not present

## 2016-07-19 DIAGNOSIS — I10 Essential (primary) hypertension: Secondary | ICD-10-CM | POA: Diagnosis not present

## 2016-07-19 DIAGNOSIS — E785 Hyperlipidemia, unspecified: Secondary | ICD-10-CM | POA: Diagnosis not present

## 2016-07-19 DIAGNOSIS — N183 Chronic kidney disease, stage 3 (moderate): Secondary | ICD-10-CM | POA: Diagnosis not present

## 2016-07-19 DIAGNOSIS — E119 Type 2 diabetes mellitus without complications: Secondary | ICD-10-CM | POA: Diagnosis not present

## 2016-07-19 DIAGNOSIS — Z5181 Encounter for therapeutic drug level monitoring: Secondary | ICD-10-CM | POA: Diagnosis not present

## 2016-07-19 DIAGNOSIS — G43909 Migraine, unspecified, not intractable, without status migrainosus: Secondary | ICD-10-CM | POA: Diagnosis not present

## 2016-07-19 DIAGNOSIS — E876 Hypokalemia: Secondary | ICD-10-CM | POA: Diagnosis not present

## 2016-08-06 ENCOUNTER — Ambulatory Visit (HOSPITAL_COMMUNITY)
Admission: RE | Admit: 2016-08-06 | Discharge: 2016-08-06 | Disposition: A | Discharge status: Home / Self Care | Payer: Medicare PPO | Source: Ambulatory Visit | Attending: Urology | Admitting: Urology

## 2016-08-06 DIAGNOSIS — D3000 Benign neoplasm of unspecified kidney: Secondary | ICD-10-CM | POA: Diagnosis not present

## 2016-08-06 DIAGNOSIS — N281 Cyst of kidney, acquired: Secondary | ICD-10-CM | POA: Diagnosis not present

## 2016-08-06 DIAGNOSIS — N2889 Other specified disorders of kidney and ureter: Secondary | ICD-10-CM | POA: Diagnosis not present

## 2016-08-06 MED ORDER — GADOBENATE DIMEGLUMINE 529 MG/ML IV SOLN
10.0000 mL | Freq: Once | INTRAVENOUS | Status: AC | PRN
  Administered 2016-08-06: 8 mL via INTRAVENOUS

## 2016-10-18 DIAGNOSIS — G43909 Migraine, unspecified, not intractable, without status migrainosus: Secondary | ICD-10-CM | POA: Diagnosis not present

## 2016-10-18 DIAGNOSIS — E119 Type 2 diabetes mellitus without complications: Secondary | ICD-10-CM | POA: Diagnosis not present

## 2016-10-18 DIAGNOSIS — Z0000 Encounter for general adult medical examination without abnormal findings: Secondary | ICD-10-CM | POA: Diagnosis not present

## 2016-10-18 DIAGNOSIS — Z136 Encounter for screening for cardiovascular disorders: Secondary | ICD-10-CM | POA: Diagnosis not present

## 2016-10-18 DIAGNOSIS — Z01118 Encounter for examination of ears and hearing with other abnormal findings: Secondary | ICD-10-CM | POA: Diagnosis not present

## 2016-10-18 DIAGNOSIS — Z5181 Encounter for therapeutic drug level monitoring: Secondary | ICD-10-CM | POA: Diagnosis not present

## 2016-10-18 DIAGNOSIS — E785 Hyperlipidemia, unspecified: Secondary | ICD-10-CM | POA: Diagnosis not present

## 2016-10-18 DIAGNOSIS — Z23 Encounter for immunization: Secondary | ICD-10-CM | POA: Diagnosis not present

## 2016-10-18 DIAGNOSIS — G894 Chronic pain syndrome: Secondary | ICD-10-CM | POA: Diagnosis not present

## 2016-10-18 DIAGNOSIS — N183 Chronic kidney disease, stage 3 (moderate): Secondary | ICD-10-CM | POA: Diagnosis not present

## 2016-10-18 DIAGNOSIS — I10 Essential (primary) hypertension: Secondary | ICD-10-CM | POA: Diagnosis not present

## 2016-11-09 DIAGNOSIS — E785 Hyperlipidemia, unspecified: Secondary | ICD-10-CM | POA: Diagnosis not present

## 2016-11-09 DIAGNOSIS — E119 Type 2 diabetes mellitus without complications: Secondary | ICD-10-CM | POA: Diagnosis not present

## 2016-11-09 DIAGNOSIS — N183 Chronic kidney disease, stage 3 (moderate): Secondary | ICD-10-CM | POA: Diagnosis not present

## 2016-11-09 DIAGNOSIS — G894 Chronic pain syndrome: Secondary | ICD-10-CM | POA: Diagnosis not present

## 2016-11-09 DIAGNOSIS — I10 Essential (primary) hypertension: Secondary | ICD-10-CM | POA: Diagnosis not present

## 2016-11-09 DIAGNOSIS — Z01118 Encounter for examination of ears and hearing with other abnormal findings: Secondary | ICD-10-CM | POA: Diagnosis not present

## 2016-11-09 DIAGNOSIS — G43909 Migraine, unspecified, not intractable, without status migrainosus: Secondary | ICD-10-CM | POA: Diagnosis not present

## 2016-12-14 DIAGNOSIS — D649 Anemia, unspecified: Secondary | ICD-10-CM | POA: Diagnosis not present

## 2016-12-14 DIAGNOSIS — N281 Cyst of kidney, acquired: Secondary | ICD-10-CM | POA: Diagnosis not present

## 2016-12-14 DIAGNOSIS — N200 Calculus of kidney: Secondary | ICD-10-CM | POA: Diagnosis not present

## 2017-01-11 DIAGNOSIS — D649 Anemia, unspecified: Secondary | ICD-10-CM | POA: Diagnosis not present

## 2017-01-11 DIAGNOSIS — N289 Disorder of kidney and ureter, unspecified: Secondary | ICD-10-CM | POA: Diagnosis not present

## 2017-01-11 DIAGNOSIS — D57219 Sickle-cell/Hb-C disease with crisis, unspecified: Secondary | ICD-10-CM | POA: Diagnosis not present

## 2017-01-12 DIAGNOSIS — N184 Chronic kidney disease, stage 4 (severe): Secondary | ICD-10-CM | POA: Insufficient documentation

## 2017-01-12 DIAGNOSIS — D57219 Sickle-cell/Hb-C disease with crisis, unspecified: Secondary | ICD-10-CM | POA: Insufficient documentation

## 2017-01-12 DIAGNOSIS — N289 Disorder of kidney and ureter, unspecified: Secondary | ICD-10-CM | POA: Insufficient documentation

## 2017-01-27 IMAGING — MR MR ABDOMEN W/O CM
5 of 10 series · 22 of 48 positions shown · non-contrast
Comparison: Noncontrast abdominal pelvic CT 06/20/2016 and
04/20/2009. Renal ultrasound 05/22/2014.

CLINICAL DATA: Hematuria with right flank pain. Indeterminate right
renal mass on CT. History of diabetes, hypertension and stroke.

EXAM:
MRI ABDOMEN WITHOUT CONTRAST
TECHNIQUE: Multiplanar multisequence MR imaging was performed without the
administration of intravenous contrast.

[Series 3: T2 · coronal · 5.0mm · 0.78mm/px · 2 of 36 slices shown (1 of 2)]
[im 1/36]
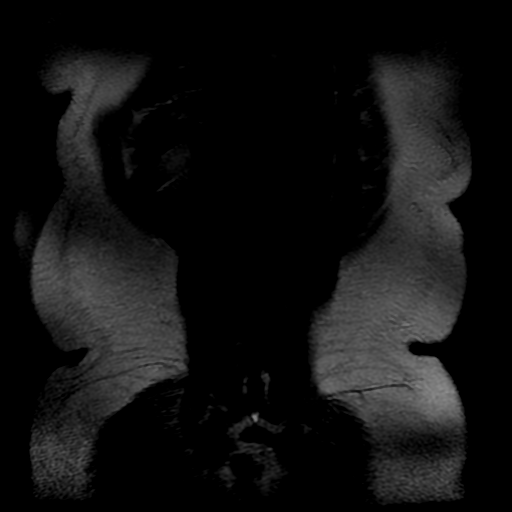
[im 36/36]
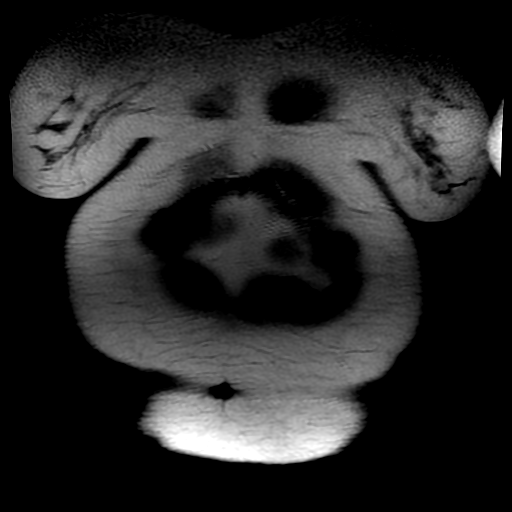

[Series 4: T2 fat-sat · axial · 5.0mm · 0.78mm/px · z∈[-56,+179]mm · 4 of 48 slices shown]
[im 1/48]
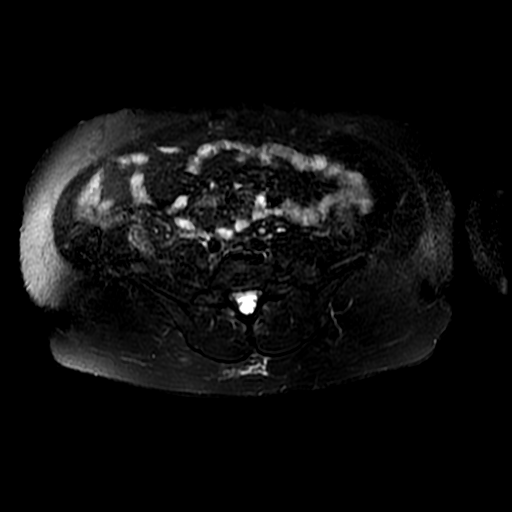
[im 16/48]
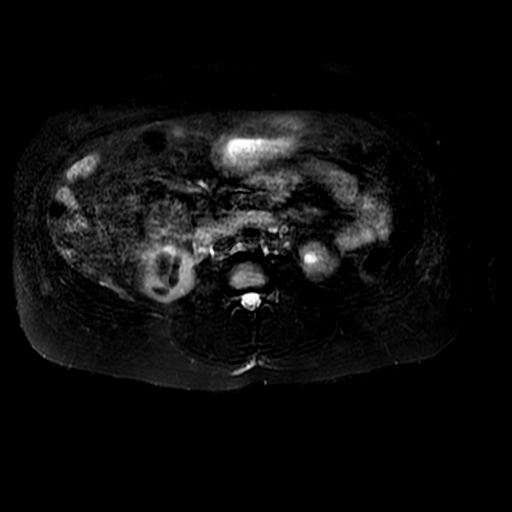
[im 32/48]
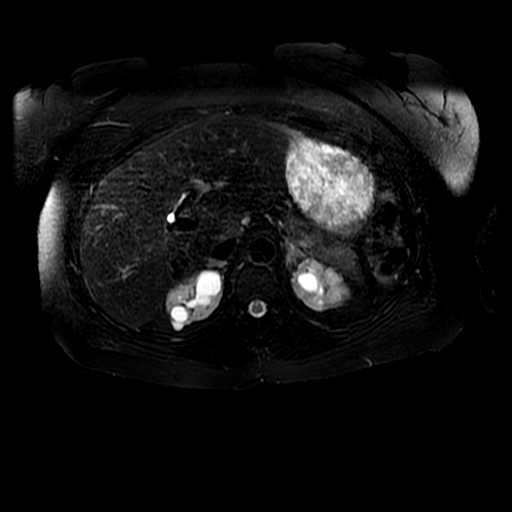
[im 48/48]
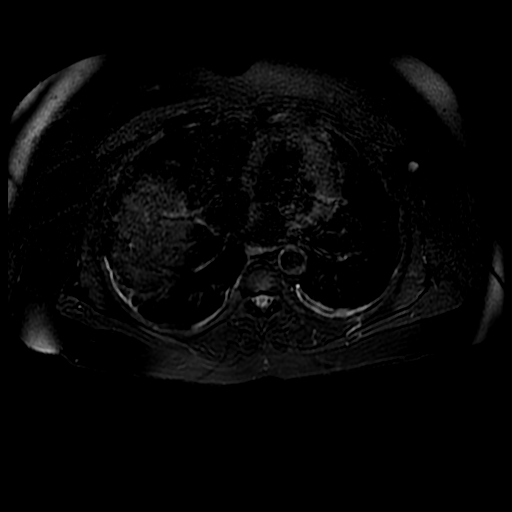

[Series 5: DWI b500 · axial · 6.0mm · 1.48mm/px · z∈[-81,+191]mm · 6 of 72 slices shown]
[im 1/72]
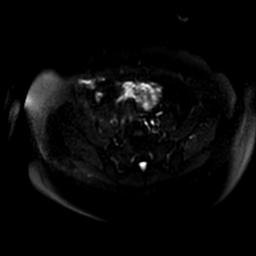
[im 15/72]
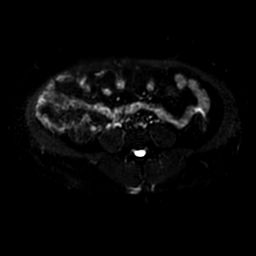
[im 29/72]
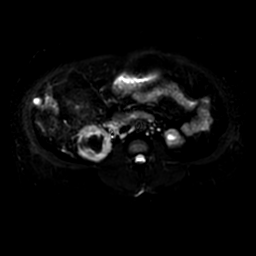
[im 43/72]
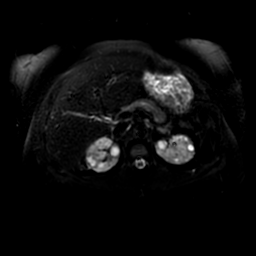
[im 57/72]
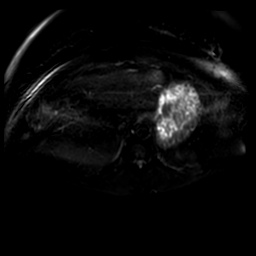
[im 72/72]
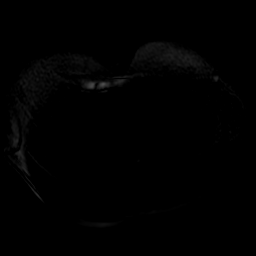

[Series 6: T2 · axial · 5.0mm · 0.78mm/px · z∈[-44,+186]mm · 4 of 47 slices shown (2 of 2)]
[im 1/47]
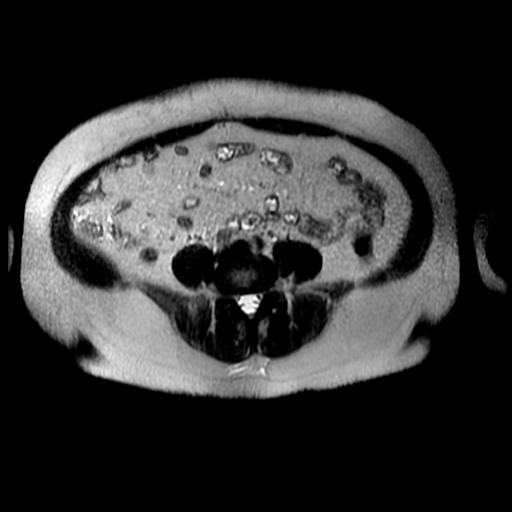
[im 16/47]
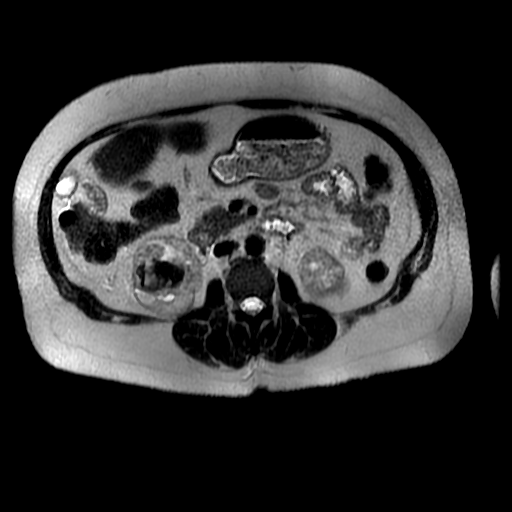
[im 31/47]
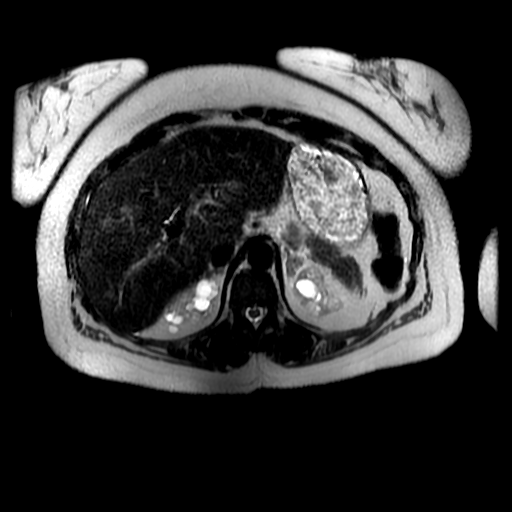
[im 47/47]
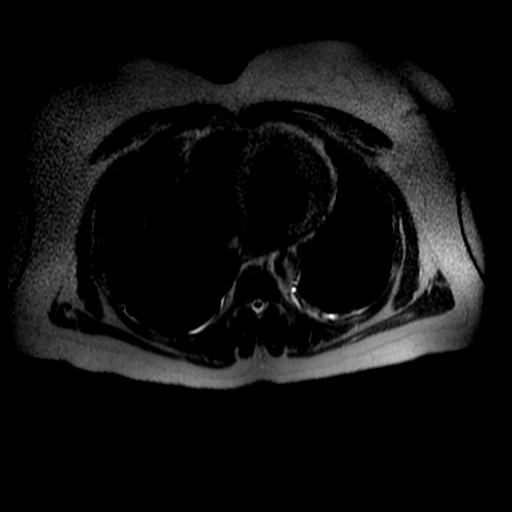

[Series 9: ax dualecho · axial · 5.0mm · 0.78mm/px · z∈[-42,+123]mm · 6 of 96 slices shown]
[im 1/96]
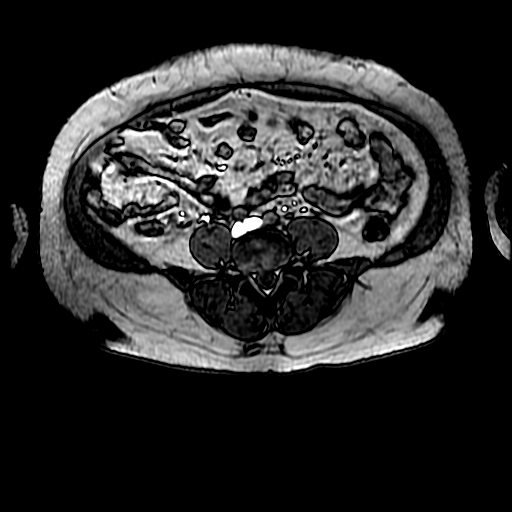
[im 14/96]
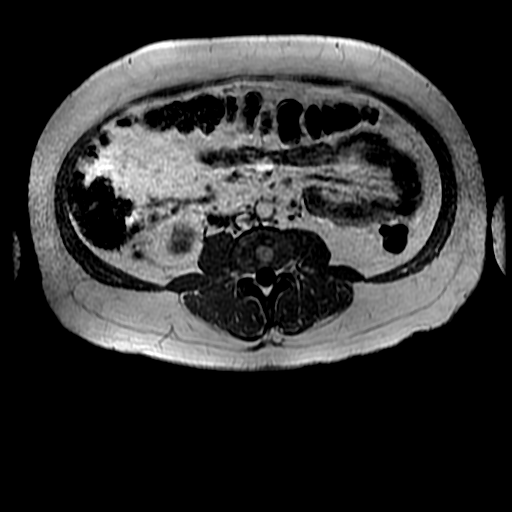
[im 28/96]
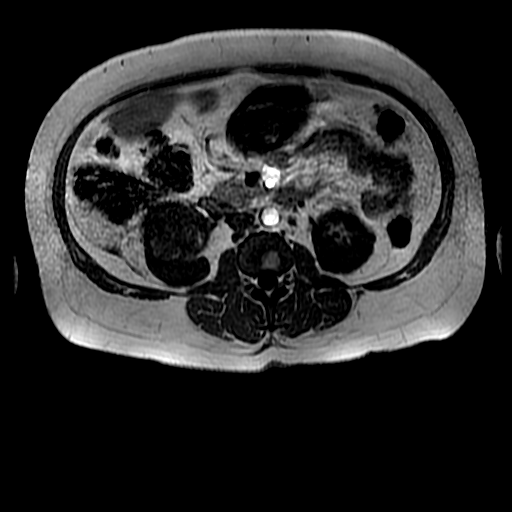
[im 41/96]
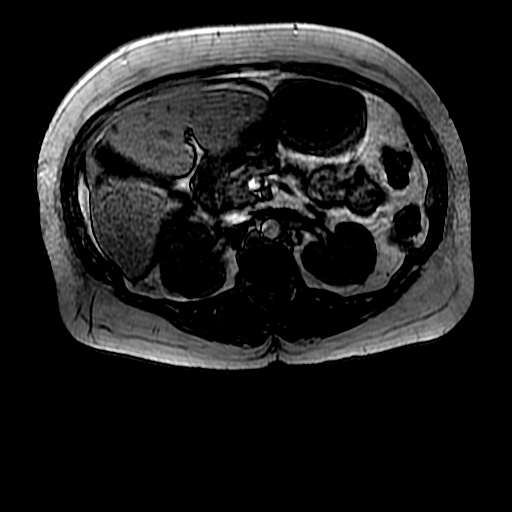
[im 55/96]
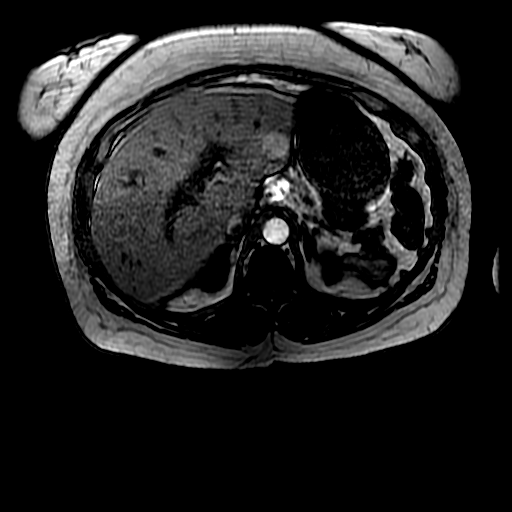
[im 68/96]
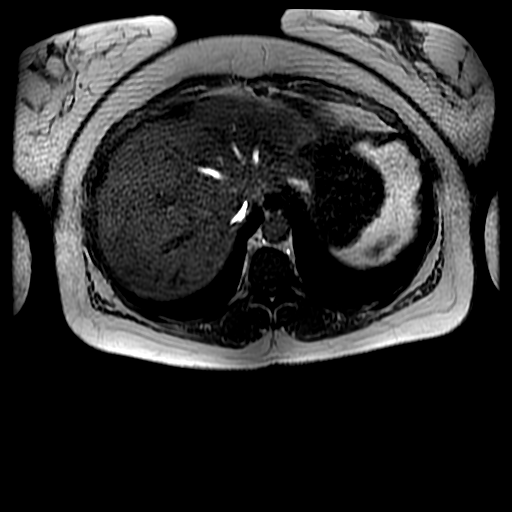

[22 of 48 positions shown; findings below may reference images not displayed]

FINDINGS: Lower chest: Probable left ventricular hypertrophy. The visualized
lower chest otherwise appears unremarkable.

Hepatobiliary: The liver appears unremarkable with normal signal and
no focal abnormality. No evidence of gallstones, gallbladder wall
thickening or biliary dilatation.

Pancreas: Unremarkable. No pancreatic ductal dilatation or
surrounding inflammatory changes.

Spleen: The spleen is small with T2 hypointensity, likely due to
iron deposition and/or auto infarction. No focal abnormality.

Adrenals/Urinary Tract: Both adrenal glands appear normal. Multiple
simple renal cysts are present bilaterally. There are no worrisome
left renal lesions. There is a complex mass in the lower pole of the
right kidney which was hyperdense on noncontrast CT. This measures
approximately 4.6 x 4.6 x 4.9 cm. This lesion is heterogeneous with
areas of intrinsic T1 shortening and markedly decreased T2 signal.
This suggests hemorrhagic components. No definite fatty components.
No evidence of hydronephrosis. The visualized bladder appears
unremarkable.

Stomach/Bowel: No evidence of bowel wall thickening, distention or
surrounding inflammatory change.The appendix appears normal.

Vascular/Lymphatic: There are no enlarged abdominal lymph nodes. No
significant vascular findings are present.

Other: No ascites.

Musculoskeletal: No acute or significant osseous findings.
IMPRESSION: 1. The complex mass involving the lower pole of the right kidney
demonstrates intrinsic T1 shortening and heterogeneous low T2
signal, features which are most consistent with hemorrhage. Overall,
this is favored to reflect a hemorrhagic cyst. However, a
hemorrhagic tumor cannot be completely excluded by this examination.
The restrictions for class 2 gadolinium administration are in the
process of being relaxed in the setting of renal failure. Therefore,
a short-term (2 month) follow up MR examination to include
post-contrast (MultiHance) imaging is suggested to further evaluate
this lesion.
2. Both kidneys demonstrate multiple simple cysts. No
hydronephrosis.
3. Small T2 hypo intense spleen consistent with iron deposition or
auto infarction.

## 2017-02-07 DIAGNOSIS — N183 Chronic kidney disease, stage 3 (moderate): Secondary | ICD-10-CM | POA: Diagnosis not present

## 2017-02-07 DIAGNOSIS — Z5181 Encounter for therapeutic drug level monitoring: Secondary | ICD-10-CM | POA: Diagnosis not present

## 2017-02-07 DIAGNOSIS — I10 Essential (primary) hypertension: Secondary | ICD-10-CM | POA: Diagnosis not present

## 2017-02-07 DIAGNOSIS — G43909 Migraine, unspecified, not intractable, without status migrainosus: Secondary | ICD-10-CM | POA: Diagnosis not present

## 2017-02-07 DIAGNOSIS — D5700 Hb-SS disease with crisis, unspecified: Secondary | ICD-10-CM | POA: Diagnosis not present

## 2017-02-07 DIAGNOSIS — E119 Type 2 diabetes mellitus without complications: Secondary | ICD-10-CM | POA: Diagnosis not present

## 2017-02-07 DIAGNOSIS — E785 Hyperlipidemia, unspecified: Secondary | ICD-10-CM | POA: Diagnosis not present

## 2017-02-07 DIAGNOSIS — Z0000 Encounter for general adult medical examination without abnormal findings: Secondary | ICD-10-CM | POA: Diagnosis not present

## 2017-02-07 DIAGNOSIS — G894 Chronic pain syndrome: Secondary | ICD-10-CM | POA: Diagnosis not present

## 2017-03-04 DIAGNOSIS — G894 Chronic pain syndrome: Secondary | ICD-10-CM | POA: Diagnosis not present

## 2017-03-04 DIAGNOSIS — M79672 Pain in left foot: Secondary | ICD-10-CM | POA: Diagnosis not present

## 2017-03-04 DIAGNOSIS — E785 Hyperlipidemia, unspecified: Secondary | ICD-10-CM | POA: Diagnosis not present

## 2017-03-04 DIAGNOSIS — D5700 Hb-SS disease with crisis, unspecified: Secondary | ICD-10-CM | POA: Diagnosis not present

## 2017-03-04 DIAGNOSIS — G43909 Migraine, unspecified, not intractable, without status migrainosus: Secondary | ICD-10-CM | POA: Diagnosis not present

## 2017-03-04 DIAGNOSIS — N183 Chronic kidney disease, stage 3 (moderate): Secondary | ICD-10-CM | POA: Diagnosis not present

## 2017-03-04 DIAGNOSIS — I10 Essential (primary) hypertension: Secondary | ICD-10-CM | POA: Diagnosis not present

## 2017-03-04 DIAGNOSIS — E119 Type 2 diabetes mellitus without complications: Secondary | ICD-10-CM | POA: Diagnosis not present

## 2017-03-11 DIAGNOSIS — E785 Hyperlipidemia, unspecified: Secondary | ICD-10-CM | POA: Diagnosis not present

## 2017-03-11 DIAGNOSIS — I10 Essential (primary) hypertension: Secondary | ICD-10-CM | POA: Diagnosis not present

## 2017-03-11 DIAGNOSIS — E669 Obesity, unspecified: Secondary | ICD-10-CM | POA: Diagnosis not present

## 2017-03-11 DIAGNOSIS — Z6830 Body mass index (BMI) 30.0-30.9, adult: Secondary | ICD-10-CM | POA: Diagnosis not present

## 2017-03-14 DIAGNOSIS — I10 Essential (primary) hypertension: Secondary | ICD-10-CM | POA: Diagnosis not present

## 2017-03-14 DIAGNOSIS — N183 Chronic kidney disease, stage 3 (moderate): Secondary | ICD-10-CM | POA: Diagnosis not present

## 2017-03-14 DIAGNOSIS — G894 Chronic pain syndrome: Secondary | ICD-10-CM | POA: Diagnosis not present

## 2017-03-14 DIAGNOSIS — D5700 Hb-SS disease with crisis, unspecified: Secondary | ICD-10-CM | POA: Diagnosis not present

## 2017-03-14 DIAGNOSIS — E785 Hyperlipidemia, unspecified: Secondary | ICD-10-CM | POA: Diagnosis not present

## 2017-03-14 DIAGNOSIS — E119 Type 2 diabetes mellitus without complications: Secondary | ICD-10-CM | POA: Diagnosis not present

## 2017-03-14 DIAGNOSIS — G43909 Migraine, unspecified, not intractable, without status migrainosus: Secondary | ICD-10-CM | POA: Diagnosis not present

## 2017-04-01 DIAGNOSIS — E119 Type 2 diabetes mellitus without complications: Secondary | ICD-10-CM | POA: Diagnosis not present

## 2017-04-01 DIAGNOSIS — G894 Chronic pain syndrome: Secondary | ICD-10-CM | POA: Diagnosis not present

## 2017-04-01 DIAGNOSIS — N183 Chronic kidney disease, stage 3 (moderate): Secondary | ICD-10-CM | POA: Diagnosis not present

## 2017-04-01 DIAGNOSIS — I10 Essential (primary) hypertension: Secondary | ICD-10-CM | POA: Diagnosis not present

## 2017-04-01 DIAGNOSIS — E785 Hyperlipidemia, unspecified: Secondary | ICD-10-CM | POA: Diagnosis not present

## 2017-04-01 DIAGNOSIS — G43909 Migraine, unspecified, not intractable, without status migrainosus: Secondary | ICD-10-CM | POA: Diagnosis not present

## 2017-04-01 DIAGNOSIS — D5700 Hb-SS disease with crisis, unspecified: Secondary | ICD-10-CM | POA: Diagnosis not present

## 2017-06-16 DIAGNOSIS — D649 Anemia, unspecified: Secondary | ICD-10-CM | POA: Diagnosis not present

## 2017-06-16 DIAGNOSIS — D57219 Sickle-cell/Hb-C disease with crisis, unspecified: Secondary | ICD-10-CM | POA: Diagnosis not present

## 2017-06-16 DIAGNOSIS — D5720 Sickle-cell/Hb-C disease without crisis: Secondary | ICD-10-CM | POA: Diagnosis not present

## 2017-06-16 DIAGNOSIS — Z7189 Other specified counseling: Secondary | ICD-10-CM | POA: Diagnosis not present

## 2017-06-16 DIAGNOSIS — E876 Hypokalemia: Secondary | ICD-10-CM | POA: Diagnosis not present

## 2017-06-16 DIAGNOSIS — E559 Vitamin D deficiency, unspecified: Secondary | ICD-10-CM | POA: Diagnosis not present

## 2017-06-16 DIAGNOSIS — Z8673 Personal history of transient ischemic attack (TIA), and cerebral infarction without residual deficits: Secondary | ICD-10-CM | POA: Diagnosis not present

## 2017-06-16 DIAGNOSIS — N189 Chronic kidney disease, unspecified: Secondary | ICD-10-CM | POA: Diagnosis not present

## 2017-06-16 DIAGNOSIS — M79651 Pain in right thigh: Secondary | ICD-10-CM | POA: Diagnosis not present

## 2017-06-16 DIAGNOSIS — M79652 Pain in left thigh: Secondary | ICD-10-CM | POA: Diagnosis not present

## 2017-07-01 DIAGNOSIS — G43909 Migraine, unspecified, not intractable, without status migrainosus: Secondary | ICD-10-CM | POA: Diagnosis not present

## 2017-07-01 DIAGNOSIS — G894 Chronic pain syndrome: Secondary | ICD-10-CM | POA: Diagnosis not present

## 2017-07-01 DIAGNOSIS — I10 Essential (primary) hypertension: Secondary | ICD-10-CM | POA: Diagnosis not present

## 2017-07-01 DIAGNOSIS — D5700 Hb-SS disease with crisis, unspecified: Secondary | ICD-10-CM | POA: Diagnosis not present

## 2017-07-01 DIAGNOSIS — Z5181 Encounter for therapeutic drug level monitoring: Secondary | ICD-10-CM | POA: Diagnosis not present

## 2017-07-01 DIAGNOSIS — E119 Type 2 diabetes mellitus without complications: Secondary | ICD-10-CM | POA: Diagnosis not present

## 2017-07-01 DIAGNOSIS — N183 Chronic kidney disease, stage 3 (moderate): Secondary | ICD-10-CM | POA: Diagnosis not present

## 2017-07-01 DIAGNOSIS — E785 Hyperlipidemia, unspecified: Secondary | ICD-10-CM | POA: Diagnosis not present

## 2017-08-04 DIAGNOSIS — H5203 Hypermetropia, bilateral: Secondary | ICD-10-CM | POA: Diagnosis not present

## 2017-08-04 DIAGNOSIS — H52223 Regular astigmatism, bilateral: Secondary | ICD-10-CM | POA: Diagnosis not present

## 2017-08-15 DIAGNOSIS — G43909 Migraine, unspecified, not intractable, without status migrainosus: Secondary | ICD-10-CM | POA: Diagnosis not present

## 2017-08-15 DIAGNOSIS — D5700 Hb-SS disease with crisis, unspecified: Secondary | ICD-10-CM | POA: Diagnosis not present

## 2017-08-15 DIAGNOSIS — E876 Hypokalemia: Secondary | ICD-10-CM | POA: Diagnosis not present

## 2017-08-15 DIAGNOSIS — I10 Essential (primary) hypertension: Secondary | ICD-10-CM | POA: Diagnosis not present

## 2017-08-15 DIAGNOSIS — E785 Hyperlipidemia, unspecified: Secondary | ICD-10-CM | POA: Diagnosis not present

## 2017-08-15 DIAGNOSIS — E119 Type 2 diabetes mellitus without complications: Secondary | ICD-10-CM | POA: Diagnosis not present

## 2017-08-15 DIAGNOSIS — G894 Chronic pain syndrome: Secondary | ICD-10-CM | POA: Diagnosis not present

## 2017-08-15 DIAGNOSIS — R05 Cough: Secondary | ICD-10-CM | POA: Diagnosis not present

## 2017-08-15 DIAGNOSIS — N183 Chronic kidney disease, stage 3 (moderate): Secondary | ICD-10-CM | POA: Diagnosis not present

## 2017-08-25 DIAGNOSIS — D5700 Hb-SS disease with crisis, unspecified: Secondary | ICD-10-CM | POA: Diagnosis not present

## 2017-08-25 DIAGNOSIS — M2550 Pain in unspecified joint: Secondary | ICD-10-CM | POA: Diagnosis not present

## 2017-08-25 DIAGNOSIS — Z23 Encounter for immunization: Secondary | ICD-10-CM | POA: Diagnosis not present

## 2017-08-25 DIAGNOSIS — D5720 Sickle-cell/Hb-C disease without crisis: Secondary | ICD-10-CM | POA: Diagnosis not present

## 2017-08-25 DIAGNOSIS — E559 Vitamin D deficiency, unspecified: Secondary | ICD-10-CM | POA: Diagnosis not present

## 2017-08-25 DIAGNOSIS — G8929 Other chronic pain: Secondary | ICD-10-CM | POA: Diagnosis not present

## 2017-08-25 DIAGNOSIS — N189 Chronic kidney disease, unspecified: Secondary | ICD-10-CM | POA: Diagnosis not present

## 2017-08-25 DIAGNOSIS — D649 Anemia, unspecified: Secondary | ICD-10-CM | POA: Diagnosis not present

## 2017-08-25 DIAGNOSIS — E785 Hyperlipidemia, unspecified: Secondary | ICD-10-CM | POA: Diagnosis not present

## 2017-08-25 DIAGNOSIS — I129 Hypertensive chronic kidney disease with stage 1 through stage 4 chronic kidney disease, or unspecified chronic kidney disease: Secondary | ICD-10-CM | POA: Diagnosis not present

## 2017-08-25 DIAGNOSIS — E1122 Type 2 diabetes mellitus with diabetic chronic kidney disease: Secondary | ICD-10-CM | POA: Diagnosis not present

## 2017-08-25 DIAGNOSIS — M130 Polyarthritis, unspecified: Secondary | ICD-10-CM | POA: Diagnosis not present

## 2017-10-12 DIAGNOSIS — G4719 Other hypersomnia: Secondary | ICD-10-CM | POA: Diagnosis not present

## 2017-10-12 DIAGNOSIS — R0683 Snoring: Secondary | ICD-10-CM | POA: Diagnosis not present

## 2017-10-12 DIAGNOSIS — D5720 Sickle-cell/Hb-C disease without crisis: Secondary | ICD-10-CM | POA: Diagnosis not present

## 2017-10-27 DIAGNOSIS — I10 Essential (primary) hypertension: Secondary | ICD-10-CM | POA: Diagnosis not present

## 2017-10-27 DIAGNOSIS — G894 Chronic pain syndrome: Secondary | ICD-10-CM | POA: Diagnosis not present

## 2017-10-27 DIAGNOSIS — E876 Hypokalemia: Secondary | ICD-10-CM | POA: Diagnosis not present

## 2017-10-27 DIAGNOSIS — E785 Hyperlipidemia, unspecified: Secondary | ICD-10-CM | POA: Diagnosis not present

## 2017-10-27 DIAGNOSIS — N183 Chronic kidney disease, stage 3 (moderate): Secondary | ICD-10-CM | POA: Diagnosis not present

## 2017-10-27 DIAGNOSIS — D5700 Hb-SS disease with crisis, unspecified: Secondary | ICD-10-CM | POA: Diagnosis not present

## 2017-10-27 DIAGNOSIS — E119 Type 2 diabetes mellitus without complications: Secondary | ICD-10-CM | POA: Diagnosis not present

## 2017-10-27 DIAGNOSIS — R05 Cough: Secondary | ICD-10-CM | POA: Diagnosis not present

## 2017-10-27 DIAGNOSIS — G43909 Migraine, unspecified, not intractable, without status migrainosus: Secondary | ICD-10-CM | POA: Diagnosis not present

## 2017-12-12 DIAGNOSIS — D571 Sickle-cell disease without crisis: Secondary | ICD-10-CM | POA: Diagnosis not present

## 2017-12-12 DIAGNOSIS — D5720 Sickle-cell/Hb-C disease without crisis: Secondary | ICD-10-CM | POA: Diagnosis not present

## 2018-01-19 DIAGNOSIS — D5700 Hb-SS disease with crisis, unspecified: Secondary | ICD-10-CM | POA: Diagnosis not present

## 2018-01-19 DIAGNOSIS — G43909 Migraine, unspecified, not intractable, without status migrainosus: Secondary | ICD-10-CM | POA: Diagnosis not present

## 2018-01-19 DIAGNOSIS — G894 Chronic pain syndrome: Secondary | ICD-10-CM | POA: Diagnosis not present

## 2018-01-19 DIAGNOSIS — E785 Hyperlipidemia, unspecified: Secondary | ICD-10-CM | POA: Diagnosis not present

## 2018-01-19 DIAGNOSIS — R05 Cough: Secondary | ICD-10-CM | POA: Diagnosis not present

## 2018-01-19 DIAGNOSIS — N183 Chronic kidney disease, stage 3 (moderate): Secondary | ICD-10-CM | POA: Diagnosis not present

## 2018-01-19 DIAGNOSIS — E119 Type 2 diabetes mellitus without complications: Secondary | ICD-10-CM | POA: Diagnosis not present

## 2018-01-19 DIAGNOSIS — I10 Essential (primary) hypertension: Secondary | ICD-10-CM | POA: Diagnosis not present

## 2018-03-27 DIAGNOSIS — D57219 Sickle-cell/Hb-C disease with crisis, unspecified: Secondary | ICD-10-CM | POA: Diagnosis not present

## 2018-03-27 DIAGNOSIS — D5720 Sickle-cell/Hb-C disease without crisis: Secondary | ICD-10-CM | POA: Diagnosis not present

## 2018-03-27 DIAGNOSIS — E876 Hypokalemia: Secondary | ICD-10-CM | POA: Diagnosis not present

## 2018-03-27 DIAGNOSIS — D649 Anemia, unspecified: Secondary | ICD-10-CM | POA: Diagnosis not present

## 2018-03-27 DIAGNOSIS — Z79899 Other long term (current) drug therapy: Secondary | ICD-10-CM | POA: Diagnosis not present

## 2018-03-27 DIAGNOSIS — N184 Chronic kidney disease, stage 4 (severe): Secondary | ICD-10-CM | POA: Diagnosis not present

## 2018-03-27 DIAGNOSIS — E559 Vitamin D deficiency, unspecified: Secondary | ICD-10-CM | POA: Diagnosis not present

## 2018-04-06 DIAGNOSIS — D571 Sickle-cell disease without crisis: Secondary | ICD-10-CM | POA: Diagnosis not present

## 2018-06-07 DIAGNOSIS — N281 Cyst of kidney, acquired: Secondary | ICD-10-CM | POA: Diagnosis not present

## 2018-06-07 DIAGNOSIS — Z23 Encounter for immunization: Secondary | ICD-10-CM | POA: Diagnosis not present

## 2018-06-07 DIAGNOSIS — D57219 Sickle-cell/Hb-C disease with crisis, unspecified: Secondary | ICD-10-CM | POA: Diagnosis not present

## 2018-06-07 DIAGNOSIS — E1122 Type 2 diabetes mellitus with diabetic chronic kidney disease: Secondary | ICD-10-CM | POA: Diagnosis not present

## 2018-06-07 DIAGNOSIS — Z8673 Personal history of transient ischemic attack (TIA), and cerebral infarction without residual deficits: Secondary | ICD-10-CM | POA: Diagnosis not present

## 2018-06-07 DIAGNOSIS — E559 Vitamin D deficiency, unspecified: Secondary | ICD-10-CM | POA: Diagnosis not present

## 2018-06-07 DIAGNOSIS — N185 Chronic kidney disease, stage 5: Secondary | ICD-10-CM | POA: Diagnosis not present

## 2018-06-07 DIAGNOSIS — N289 Disorder of kidney and ureter, unspecified: Secondary | ICD-10-CM | POA: Diagnosis not present

## 2018-06-07 DIAGNOSIS — D571 Sickle-cell disease without crisis: Secondary | ICD-10-CM | POA: Diagnosis not present

## 2018-06-07 DIAGNOSIS — Z992 Dependence on renal dialysis: Secondary | ICD-10-CM | POA: Diagnosis not present

## 2018-06-07 DIAGNOSIS — G8929 Other chronic pain: Secondary | ICD-10-CM | POA: Diagnosis not present

## 2018-06-07 DIAGNOSIS — D5720 Sickle-cell/Hb-C disease without crisis: Secondary | ICD-10-CM | POA: Diagnosis not present

## 2018-06-07 DIAGNOSIS — N184 Chronic kidney disease, stage 4 (severe): Secondary | ICD-10-CM | POA: Diagnosis not present

## 2018-06-07 DIAGNOSIS — R93429 Abnormal radiologic findings on diagnostic imaging of unspecified kidney: Secondary | ICD-10-CM | POA: Diagnosis not present

## 2018-06-12 DIAGNOSIS — E785 Hyperlipidemia, unspecified: Secondary | ICD-10-CM | POA: Diagnosis not present

## 2018-06-12 DIAGNOSIS — D5700 Hb-SS disease with crisis, unspecified: Secondary | ICD-10-CM | POA: Diagnosis not present

## 2018-06-12 DIAGNOSIS — E876 Hypokalemia: Secondary | ICD-10-CM | POA: Diagnosis not present

## 2018-06-12 DIAGNOSIS — N183 Chronic kidney disease, stage 3 (moderate): Secondary | ICD-10-CM | POA: Diagnosis not present

## 2018-06-12 DIAGNOSIS — Z01118 Encounter for examination of ears and hearing with other abnormal findings: Secondary | ICD-10-CM | POA: Diagnosis not present

## 2018-06-12 DIAGNOSIS — I10 Essential (primary) hypertension: Secondary | ICD-10-CM | POA: Diagnosis not present

## 2018-06-12 DIAGNOSIS — E119 Type 2 diabetes mellitus without complications: Secondary | ICD-10-CM | POA: Diagnosis not present

## 2018-06-12 DIAGNOSIS — Z0001 Encounter for general adult medical examination with abnormal findings: Secondary | ICD-10-CM | POA: Diagnosis not present

## 2018-06-12 DIAGNOSIS — G894 Chronic pain syndrome: Secondary | ICD-10-CM | POA: Diagnosis not present

## 2018-06-12 DIAGNOSIS — G43909 Migraine, unspecified, not intractable, without status migrainosus: Secondary | ICD-10-CM | POA: Diagnosis not present

## 2018-06-12 DIAGNOSIS — R05 Cough: Secondary | ICD-10-CM | POA: Diagnosis not present

## 2018-06-26 DIAGNOSIS — R05 Cough: Secondary | ICD-10-CM | POA: Diagnosis not present

## 2018-06-26 DIAGNOSIS — E119 Type 2 diabetes mellitus without complications: Secondary | ICD-10-CM | POA: Diagnosis not present

## 2018-06-26 DIAGNOSIS — G43909 Migraine, unspecified, not intractable, without status migrainosus: Secondary | ICD-10-CM | POA: Diagnosis not present

## 2018-06-26 DIAGNOSIS — E785 Hyperlipidemia, unspecified: Secondary | ICD-10-CM | POA: Diagnosis not present

## 2018-06-26 DIAGNOSIS — Z0000 Encounter for general adult medical examination without abnormal findings: Secondary | ICD-10-CM | POA: Diagnosis not present

## 2018-06-26 DIAGNOSIS — I10 Essential (primary) hypertension: Secondary | ICD-10-CM | POA: Diagnosis not present

## 2018-06-26 DIAGNOSIS — N183 Chronic kidney disease, stage 3 (moderate): Secondary | ICD-10-CM | POA: Diagnosis not present

## 2018-07-12 DIAGNOSIS — N185 Chronic kidney disease, stage 5: Secondary | ICD-10-CM | POA: Diagnosis not present

## 2018-07-12 DIAGNOSIS — D649 Anemia, unspecified: Secondary | ICD-10-CM | POA: Diagnosis not present

## 2018-07-12 DIAGNOSIS — N2581 Secondary hyperparathyroidism of renal origin: Secondary | ICD-10-CM | POA: Diagnosis not present

## 2018-07-12 DIAGNOSIS — I10 Essential (primary) hypertension: Secondary | ICD-10-CM | POA: Diagnosis not present

## 2018-07-12 DIAGNOSIS — D57219 Sickle-cell/Hb-C disease with crisis, unspecified: Secondary | ICD-10-CM | POA: Diagnosis not present

## 2018-07-12 DIAGNOSIS — N184 Chronic kidney disease, stage 4 (severe): Secondary | ICD-10-CM | POA: Diagnosis not present

## 2018-07-20 DIAGNOSIS — N184 Chronic kidney disease, stage 4 (severe): Secondary | ICD-10-CM | POA: Diagnosis not present

## 2018-07-20 DIAGNOSIS — N2889 Other specified disorders of kidney and ureter: Secondary | ICD-10-CM | POA: Diagnosis not present

## 2018-07-20 DIAGNOSIS — R93429 Abnormal radiologic findings on diagnostic imaging of unspecified kidney: Secondary | ICD-10-CM | POA: Diagnosis not present

## 2018-07-20 DIAGNOSIS — D57219 Sickle-cell/Hb-C disease with crisis, unspecified: Secondary | ICD-10-CM | POA: Diagnosis not present

## 2018-07-31 DIAGNOSIS — J4520 Mild intermittent asthma, uncomplicated: Secondary | ICD-10-CM | POA: Diagnosis not present

## 2018-07-31 DIAGNOSIS — D5720 Sickle-cell/Hb-C disease without crisis: Secondary | ICD-10-CM | POA: Diagnosis not present

## 2018-07-31 DIAGNOSIS — Z23 Encounter for immunization: Secondary | ICD-10-CM | POA: Diagnosis not present

## 2018-07-31 DIAGNOSIS — R05 Cough: Secondary | ICD-10-CM | POA: Diagnosis not present

## 2018-08-29 DIAGNOSIS — D5720 Sickle-cell/Hb-C disease without crisis: Secondary | ICD-10-CM | POA: Diagnosis not present

## 2018-10-24 DIAGNOSIS — D5720 Sickle-cell/Hb-C disease without crisis: Secondary | ICD-10-CM | POA: Diagnosis not present

## 2018-11-22 DIAGNOSIS — E611 Iron deficiency: Secondary | ICD-10-CM | POA: Insufficient documentation

## 2018-11-22 DIAGNOSIS — D57219 Sickle-cell/Hb-C disease with crisis, unspecified: Secondary | ICD-10-CM | POA: Diagnosis not present

## 2018-11-22 DIAGNOSIS — D5720 Sickle-cell/Hb-C disease without crisis: Secondary | ICD-10-CM | POA: Diagnosis not present

## 2018-11-22 DIAGNOSIS — D571 Sickle-cell disease without crisis: Secondary | ICD-10-CM | POA: Diagnosis not present

## 2018-11-22 DIAGNOSIS — G8929 Other chronic pain: Secondary | ICD-10-CM | POA: Diagnosis not present

## 2018-11-22 DIAGNOSIS — N184 Chronic kidney disease, stage 4 (severe): Secondary | ICD-10-CM | POA: Diagnosis not present

## 2018-11-22 DIAGNOSIS — D631 Anemia in chronic kidney disease: Secondary | ICD-10-CM | POA: Diagnosis not present

## 2018-11-22 DIAGNOSIS — E1122 Type 2 diabetes mellitus with diabetic chronic kidney disease: Secondary | ICD-10-CM | POA: Diagnosis not present

## 2018-11-22 DIAGNOSIS — Z79899 Other long term (current) drug therapy: Secondary | ICD-10-CM | POA: Diagnosis not present

## 2018-11-22 DIAGNOSIS — R062 Wheezing: Secondary | ICD-10-CM | POA: Diagnosis not present

## 2018-11-22 DIAGNOSIS — N185 Chronic kidney disease, stage 5: Secondary | ICD-10-CM | POA: Diagnosis not present

## 2018-11-22 DIAGNOSIS — E559 Vitamin D deficiency, unspecified: Secondary | ICD-10-CM | POA: Diagnosis not present

## 2018-12-15 DIAGNOSIS — E611 Iron deficiency: Secondary | ICD-10-CM | POA: Diagnosis not present

## 2018-12-15 DIAGNOSIS — D57219 Sickle-cell/Hb-C disease with crisis, unspecified: Secondary | ICD-10-CM | POA: Diagnosis not present

## 2019-01-03 DIAGNOSIS — N186 End stage renal disease: Secondary | ICD-10-CM | POA: Insufficient documentation

## 2019-01-03 DIAGNOSIS — E213 Hyperparathyroidism, unspecified: Secondary | ICD-10-CM | POA: Diagnosis not present

## 2019-01-03 DIAGNOSIS — D631 Anemia in chronic kidney disease: Secondary | ICD-10-CM | POA: Diagnosis not present

## 2019-01-03 DIAGNOSIS — D5720 Sickle-cell/Hb-C disease without crisis: Secondary | ICD-10-CM | POA: Diagnosis not present

## 2019-01-03 DIAGNOSIS — N184 Chronic kidney disease, stage 4 (severe): Secondary | ICD-10-CM | POA: Diagnosis not present

## 2019-01-03 DIAGNOSIS — R05 Cough: Secondary | ICD-10-CM | POA: Diagnosis not present

## 2019-01-03 DIAGNOSIS — Z79899 Other long term (current) drug therapy: Secondary | ICD-10-CM | POA: Diagnosis not present

## 2019-01-03 DIAGNOSIS — J302 Other seasonal allergic rhinitis: Secondary | ICD-10-CM | POA: Diagnosis not present

## 2019-01-03 DIAGNOSIS — I10 Essential (primary) hypertension: Secondary | ICD-10-CM | POA: Diagnosis not present

## 2019-01-03 DIAGNOSIS — N185 Chronic kidney disease, stage 5: Secondary | ICD-10-CM | POA: Diagnosis not present

## 2019-01-24 DIAGNOSIS — N185 Chronic kidney disease, stage 5: Secondary | ICD-10-CM | POA: Diagnosis not present

## 2019-01-24 DIAGNOSIS — D5720 Sickle-cell/Hb-C disease without crisis: Secondary | ICD-10-CM | POA: Diagnosis not present

## 2019-01-29 DIAGNOSIS — E118 Type 2 diabetes mellitus with unspecified complications: Secondary | ICD-10-CM | POA: Diagnosis not present

## 2019-01-29 DIAGNOSIS — Z8673 Personal history of transient ischemic attack (TIA), and cerebral infarction without residual deficits: Secondary | ICD-10-CM | POA: Diagnosis not present

## 2019-01-31 DIAGNOSIS — E213 Hyperparathyroidism, unspecified: Secondary | ICD-10-CM | POA: Diagnosis not present

## 2019-01-31 DIAGNOSIS — N185 Chronic kidney disease, stage 5: Secondary | ICD-10-CM | POA: Diagnosis not present

## 2019-02-21 DIAGNOSIS — N185 Chronic kidney disease, stage 5: Secondary | ICD-10-CM | POA: Diagnosis not present

## 2019-02-21 DIAGNOSIS — D5720 Sickle-cell/Hb-C disease without crisis: Secondary | ICD-10-CM | POA: Diagnosis not present

## 2019-02-21 DIAGNOSIS — G8929 Other chronic pain: Secondary | ICD-10-CM | POA: Diagnosis not present

## 2019-03-19 DIAGNOSIS — E611 Iron deficiency: Secondary | ICD-10-CM | POA: Diagnosis not present

## 2019-03-19 DIAGNOSIS — N184 Chronic kidney disease, stage 4 (severe): Secondary | ICD-10-CM | POA: Diagnosis not present

## 2019-03-19 DIAGNOSIS — D5720 Sickle-cell/Hb-C disease without crisis: Secondary | ICD-10-CM | POA: Diagnosis not present

## 2019-03-19 DIAGNOSIS — Z01818 Encounter for other preprocedural examination: Secondary | ICD-10-CM | POA: Diagnosis not present

## 2019-03-19 DIAGNOSIS — Z8673 Personal history of transient ischemic attack (TIA), and cerebral infarction without residual deficits: Secondary | ICD-10-CM | POA: Diagnosis not present

## 2019-03-19 DIAGNOSIS — E213 Hyperparathyroidism, unspecified: Secondary | ICD-10-CM | POA: Diagnosis not present

## 2019-03-19 DIAGNOSIS — I10 Essential (primary) hypertension: Secondary | ICD-10-CM | POA: Diagnosis not present

## 2019-03-19 DIAGNOSIS — N185 Chronic kidney disease, stage 5: Secondary | ICD-10-CM | POA: Diagnosis not present

## 2019-03-27 DIAGNOSIS — D631 Anemia in chronic kidney disease: Secondary | ICD-10-CM | POA: Diagnosis not present

## 2019-03-27 DIAGNOSIS — D57219 Sickle-cell/Hb-C disease with crisis, unspecified: Secondary | ICD-10-CM | POA: Diagnosis not present

## 2019-03-27 DIAGNOSIS — Z7682 Awaiting organ transplant status: Secondary | ICD-10-CM | POA: Diagnosis not present

## 2019-03-27 DIAGNOSIS — Z114 Encounter for screening for human immunodeficiency virus [HIV]: Secondary | ICD-10-CM | POA: Diagnosis not present

## 2019-03-27 DIAGNOSIS — Z79899 Other long term (current) drug therapy: Secondary | ICD-10-CM | POA: Diagnosis not present

## 2019-03-27 DIAGNOSIS — D5720 Sickle-cell/Hb-C disease without crisis: Secondary | ICD-10-CM | POA: Diagnosis not present

## 2019-03-27 DIAGNOSIS — Z01818 Encounter for other preprocedural examination: Secondary | ICD-10-CM | POA: Diagnosis not present

## 2019-03-27 DIAGNOSIS — N185 Chronic kidney disease, stage 5: Secondary | ICD-10-CM | POA: Diagnosis not present

## 2019-03-27 DIAGNOSIS — N189 Chronic kidney disease, unspecified: Secondary | ICD-10-CM | POA: Diagnosis not present

## 2019-03-27 DIAGNOSIS — Z1159 Encounter for screening for other viral diseases: Secondary | ICD-10-CM | POA: Diagnosis not present

## 2019-03-27 DIAGNOSIS — N186 End stage renal disease: Secondary | ICD-10-CM | POA: Diagnosis not present

## 2019-03-27 DIAGNOSIS — I771 Stricture of artery: Secondary | ICD-10-CM | POA: Diagnosis not present

## 2019-03-31 DIAGNOSIS — Z01818 Encounter for other preprocedural examination: Secondary | ICD-10-CM | POA: Insufficient documentation

## 2019-04-03 DIAGNOSIS — H524 Presbyopia: Secondary | ICD-10-CM | POA: Diagnosis not present

## 2019-04-03 DIAGNOSIS — E119 Type 2 diabetes mellitus without complications: Secondary | ICD-10-CM | POA: Diagnosis not present

## 2019-04-03 DIAGNOSIS — H2513 Age-related nuclear cataract, bilateral: Secondary | ICD-10-CM | POA: Diagnosis not present

## 2019-04-25 DIAGNOSIS — E1122 Type 2 diabetes mellitus with diabetic chronic kidney disease: Secondary | ICD-10-CM | POA: Diagnosis not present

## 2019-04-25 DIAGNOSIS — I69398 Other sequelae of cerebral infarction: Secondary | ICD-10-CM | POA: Diagnosis not present

## 2019-04-25 DIAGNOSIS — I69328 Other speech and language deficits following cerebral infarction: Secondary | ICD-10-CM | POA: Diagnosis not present

## 2019-04-25 DIAGNOSIS — N185 Chronic kidney disease, stage 5: Secondary | ICD-10-CM | POA: Diagnosis not present

## 2019-04-25 DIAGNOSIS — E1136 Type 2 diabetes mellitus with diabetic cataract: Secondary | ICD-10-CM | POA: Diagnosis not present

## 2019-04-25 DIAGNOSIS — D631 Anemia in chronic kidney disease: Secondary | ICD-10-CM | POA: Diagnosis not present

## 2019-04-25 DIAGNOSIS — D5720 Sickle-cell/Hb-C disease without crisis: Secondary | ICD-10-CM | POA: Diagnosis not present

## 2019-04-25 DIAGNOSIS — H539 Unspecified visual disturbance: Secondary | ICD-10-CM | POA: Diagnosis not present

## 2019-04-25 DIAGNOSIS — I120 Hypertensive chronic kidney disease with stage 5 chronic kidney disease or end stage renal disease: Secondary | ICD-10-CM | POA: Diagnosis not present

## 2019-05-03 DIAGNOSIS — Z1239 Encounter for other screening for malignant neoplasm of breast: Secondary | ICD-10-CM | POA: Diagnosis not present

## 2019-05-03 DIAGNOSIS — Z1211 Encounter for screening for malignant neoplasm of colon: Secondary | ICD-10-CM | POA: Diagnosis not present

## 2019-05-09 DIAGNOSIS — N185 Chronic kidney disease, stage 5: Secondary | ICD-10-CM | POA: Diagnosis not present

## 2019-05-09 DIAGNOSIS — D5720 Sickle-cell/Hb-C disease without crisis: Secondary | ICD-10-CM | POA: Diagnosis not present

## 2019-05-16 DIAGNOSIS — N185 Chronic kidney disease, stage 5: Secondary | ICD-10-CM | POA: Diagnosis not present

## 2019-05-16 DIAGNOSIS — D5720 Sickle-cell/Hb-C disease without crisis: Secondary | ICD-10-CM | POA: Diagnosis not present

## 2019-05-21 DIAGNOSIS — N281 Cyst of kidney, acquired: Secondary | ICD-10-CM | POA: Diagnosis not present

## 2019-05-21 DIAGNOSIS — N185 Chronic kidney disease, stage 5: Secondary | ICD-10-CM | POA: Diagnosis not present

## 2019-05-21 DIAGNOSIS — I10 Essential (primary) hypertension: Secondary | ICD-10-CM | POA: Diagnosis not present

## 2019-05-21 DIAGNOSIS — E213 Hyperparathyroidism, unspecified: Secondary | ICD-10-CM | POA: Diagnosis not present

## 2019-05-29 DIAGNOSIS — Z01818 Encounter for other preprocedural examination: Secondary | ICD-10-CM | POA: Diagnosis not present

## 2019-05-29 DIAGNOSIS — N185 Chronic kidney disease, stage 5: Secondary | ICD-10-CM | POA: Diagnosis not present

## 2019-05-29 DIAGNOSIS — Z0000 Encounter for general adult medical examination without abnormal findings: Secondary | ICD-10-CM | POA: Diagnosis not present

## 2019-06-19 DIAGNOSIS — D5720 Sickle-cell/Hb-C disease without crisis: Secondary | ICD-10-CM | POA: Diagnosis not present

## 2019-06-19 DIAGNOSIS — N185 Chronic kidney disease, stage 5: Secondary | ICD-10-CM | POA: Diagnosis not present

## 2019-07-06 DIAGNOSIS — Z01810 Encounter for preprocedural cardiovascular examination: Secondary | ICD-10-CM | POA: Diagnosis not present

## 2019-07-06 DIAGNOSIS — Z1231 Encounter for screening mammogram for malignant neoplasm of breast: Secondary | ICD-10-CM | POA: Diagnosis not present

## 2019-07-06 DIAGNOSIS — I517 Cardiomegaly: Secondary | ICD-10-CM | POA: Diagnosis not present

## 2019-07-06 DIAGNOSIS — N185 Chronic kidney disease, stage 5: Secondary | ICD-10-CM | POA: Diagnosis not present

## 2019-07-06 DIAGNOSIS — D5720 Sickle-cell/Hb-C disease without crisis: Secondary | ICD-10-CM | POA: Diagnosis not present

## 2019-07-10 DIAGNOSIS — I120 Hypertensive chronic kidney disease with stage 5 chronic kidney disease or end stage renal disease: Secondary | ICD-10-CM | POA: Diagnosis not present

## 2019-07-10 DIAGNOSIS — Z8673 Personal history of transient ischemic attack (TIA), and cerebral infarction without residual deficits: Secondary | ICD-10-CM | POA: Diagnosis not present

## 2019-07-10 DIAGNOSIS — D57219 Sickle-cell/Hb-C disease with crisis, unspecified: Secondary | ICD-10-CM | POA: Diagnosis not present

## 2019-07-10 DIAGNOSIS — D5720 Sickle-cell/Hb-C disease without crisis: Secondary | ICD-10-CM | POA: Diagnosis not present

## 2019-07-10 DIAGNOSIS — E1122 Type 2 diabetes mellitus with diabetic chronic kidney disease: Secondary | ICD-10-CM | POA: Diagnosis not present

## 2019-07-10 DIAGNOSIS — Z79891 Long term (current) use of opiate analgesic: Secondary | ICD-10-CM | POA: Diagnosis not present

## 2019-07-10 DIAGNOSIS — N185 Chronic kidney disease, stage 5: Secondary | ICD-10-CM | POA: Diagnosis not present

## 2019-07-10 DIAGNOSIS — D631 Anemia in chronic kidney disease: Secondary | ICD-10-CM | POA: Diagnosis not present

## 2019-08-01 DIAGNOSIS — N185 Chronic kidney disease, stage 5: Secondary | ICD-10-CM | POA: Diagnosis not present

## 2019-08-01 DIAGNOSIS — M79602 Pain in left arm: Secondary | ICD-10-CM | POA: Diagnosis not present

## 2019-08-01 DIAGNOSIS — D5720 Sickle-cell/Hb-C disease without crisis: Secondary | ICD-10-CM | POA: Diagnosis not present

## 2019-08-01 DIAGNOSIS — N281 Cyst of kidney, acquired: Secondary | ICD-10-CM | POA: Diagnosis not present

## 2019-08-01 DIAGNOSIS — M79601 Pain in right arm: Secondary | ICD-10-CM | POA: Diagnosis not present

## 2019-08-01 DIAGNOSIS — Z23 Encounter for immunization: Secondary | ICD-10-CM | POA: Diagnosis not present

## 2019-08-01 DIAGNOSIS — D57219 Sickle-cell/Hb-C disease with crisis, unspecified: Secondary | ICD-10-CM | POA: Diagnosis not present

## 2019-08-01 DIAGNOSIS — M79604 Pain in right leg: Secondary | ICD-10-CM | POA: Diagnosis not present

## 2019-08-01 DIAGNOSIS — G8929 Other chronic pain: Secondary | ICD-10-CM | POA: Diagnosis not present

## 2019-08-01 DIAGNOSIS — D631 Anemia in chronic kidney disease: Secondary | ICD-10-CM | POA: Diagnosis not present

## 2019-08-01 DIAGNOSIS — I129 Hypertensive chronic kidney disease with stage 1 through stage 4 chronic kidney disease, or unspecified chronic kidney disease: Secondary | ICD-10-CM | POA: Diagnosis not present

## 2019-08-22 DIAGNOSIS — N185 Chronic kidney disease, stage 5: Secondary | ICD-10-CM | POA: Diagnosis not present

## 2019-08-22 DIAGNOSIS — D5720 Sickle-cell/Hb-C disease without crisis: Secondary | ICD-10-CM | POA: Diagnosis not present

## 2019-09-07 DIAGNOSIS — E119 Type 2 diabetes mellitus without complications: Secondary | ICD-10-CM | POA: Diagnosis not present

## 2019-09-10 DIAGNOSIS — Z5181 Encounter for therapeutic drug level monitoring: Secondary | ICD-10-CM | POA: Diagnosis not present

## 2019-09-10 DIAGNOSIS — N281 Cyst of kidney, acquired: Secondary | ICD-10-CM | POA: Diagnosis not present

## 2019-09-10 DIAGNOSIS — E213 Hyperparathyroidism, unspecified: Secondary | ICD-10-CM | POA: Diagnosis not present

## 2019-09-10 DIAGNOSIS — I10 Essential (primary) hypertension: Secondary | ICD-10-CM | POA: Diagnosis not present

## 2019-09-10 DIAGNOSIS — N185 Chronic kidney disease, stage 5: Secondary | ICD-10-CM | POA: Diagnosis not present

## 2019-09-21 HISTORY — PX: COLONOSCOPY: SHX174

## 2019-09-26 DIAGNOSIS — I10 Essential (primary) hypertension: Secondary | ICD-10-CM | POA: Diagnosis not present

## 2019-09-26 DIAGNOSIS — D631 Anemia in chronic kidney disease: Secondary | ICD-10-CM | POA: Diagnosis not present

## 2019-09-26 DIAGNOSIS — E876 Hypokalemia: Secondary | ICD-10-CM | POA: Diagnosis not present

## 2019-09-26 DIAGNOSIS — E1122 Type 2 diabetes mellitus with diabetic chronic kidney disease: Secondary | ICD-10-CM | POA: Diagnosis not present

## 2019-09-26 DIAGNOSIS — E559 Vitamin D deficiency, unspecified: Secondary | ICD-10-CM | POA: Diagnosis not present

## 2019-09-26 DIAGNOSIS — R062 Wheezing: Secondary | ICD-10-CM | POA: Diagnosis not present

## 2019-09-26 DIAGNOSIS — E785 Hyperlipidemia, unspecified: Secondary | ICD-10-CM | POA: Diagnosis not present

## 2019-09-26 DIAGNOSIS — F1190 Opioid use, unspecified, uncomplicated: Secondary | ICD-10-CM | POA: Diagnosis not present

## 2019-09-26 DIAGNOSIS — D5720 Sickle-cell/Hb-C disease without crisis: Secondary | ICD-10-CM | POA: Diagnosis not present

## 2019-09-26 DIAGNOSIS — N185 Chronic kidney disease, stage 5: Secondary | ICD-10-CM | POA: Diagnosis not present

## 2019-09-26 DIAGNOSIS — I129 Hypertensive chronic kidney disease with stage 1 through stage 4 chronic kidney disease, or unspecified chronic kidney disease: Secondary | ICD-10-CM | POA: Diagnosis not present

## 2019-09-26 DIAGNOSIS — D57218 Sickle-cell/hb-c disease with crisis with other specified complication: Secondary | ICD-10-CM | POA: Diagnosis not present

## 2019-12-06 DIAGNOSIS — N185 Chronic kidney disease, stage 5: Secondary | ICD-10-CM | POA: Diagnosis not present

## 2019-12-06 DIAGNOSIS — D5720 Sickle-cell/Hb-C disease without crisis: Secondary | ICD-10-CM | POA: Diagnosis not present

## 2019-12-26 DIAGNOSIS — R062 Wheezing: Secondary | ICD-10-CM | POA: Diagnosis not present

## 2019-12-26 DIAGNOSIS — I120 Hypertensive chronic kidney disease with stage 5 chronic kidney disease or end stage renal disease: Secondary | ICD-10-CM | POA: Diagnosis not present

## 2019-12-26 DIAGNOSIS — Z8673 Personal history of transient ischemic attack (TIA), and cerebral infarction without residual deficits: Secondary | ICD-10-CM | POA: Diagnosis not present

## 2019-12-26 DIAGNOSIS — E876 Hypokalemia: Secondary | ICD-10-CM | POA: Diagnosis not present

## 2019-12-26 DIAGNOSIS — E559 Vitamin D deficiency, unspecified: Secondary | ICD-10-CM | POA: Diagnosis not present

## 2019-12-26 DIAGNOSIS — D57213 Sickle-cell/hb-c disease with cerebral vascular involvement: Secondary | ICD-10-CM | POA: Diagnosis not present

## 2019-12-26 DIAGNOSIS — E1122 Type 2 diabetes mellitus with diabetic chronic kidney disease: Secondary | ICD-10-CM | POA: Diagnosis not present

## 2019-12-26 DIAGNOSIS — D57219 Sickle-cell/Hb-C disease with crisis, unspecified: Secondary | ICD-10-CM | POA: Diagnosis not present

## 2019-12-26 DIAGNOSIS — N185 Chronic kidney disease, stage 5: Secondary | ICD-10-CM | POA: Diagnosis not present

## 2019-12-26 DIAGNOSIS — D631 Anemia in chronic kidney disease: Secondary | ICD-10-CM | POA: Diagnosis not present

## 2020-01-07 DIAGNOSIS — D5720 Sickle-cell/Hb-C disease without crisis: Secondary | ICD-10-CM | POA: Diagnosis not present

## 2020-01-07 DIAGNOSIS — N185 Chronic kidney disease, stage 5: Secondary | ICD-10-CM | POA: Diagnosis not present

## 2020-02-07 DIAGNOSIS — Z01419 Encounter for gynecological examination (general) (routine) without abnormal findings: Secondary | ICD-10-CM | POA: Diagnosis not present

## 2020-02-07 DIAGNOSIS — Z124 Encounter for screening for malignant neoplasm of cervix: Secondary | ICD-10-CM | POA: Diagnosis not present

## 2020-03-12 DIAGNOSIS — D571 Sickle-cell disease without crisis: Secondary | ICD-10-CM

## 2020-03-12 HISTORY — DX: Sickle-cell disease without crisis: D57.1

## 2020-03-18 DIAGNOSIS — K648 Other hemorrhoids: Secondary | ICD-10-CM | POA: Diagnosis not present

## 2020-03-18 DIAGNOSIS — Z7984 Long term (current) use of oral hypoglycemic drugs: Secondary | ICD-10-CM | POA: Diagnosis not present

## 2020-03-18 DIAGNOSIS — K5730 Diverticulosis of large intestine without perforation or abscess without bleeding: Secondary | ICD-10-CM | POA: Diagnosis not present

## 2020-03-18 DIAGNOSIS — E785 Hyperlipidemia, unspecified: Secondary | ICD-10-CM | POA: Diagnosis not present

## 2020-03-18 DIAGNOSIS — Z1211 Encounter for screening for malignant neoplasm of colon: Secondary | ICD-10-CM | POA: Diagnosis not present

## 2020-03-18 DIAGNOSIS — G43909 Migraine, unspecified, not intractable, without status migrainosus: Secondary | ICD-10-CM | POA: Diagnosis not present

## 2020-03-18 DIAGNOSIS — N185 Chronic kidney disease, stage 5: Secondary | ICD-10-CM | POA: Diagnosis not present

## 2020-03-18 DIAGNOSIS — E1122 Type 2 diabetes mellitus with diabetic chronic kidney disease: Secondary | ICD-10-CM | POA: Diagnosis not present

## 2020-03-18 DIAGNOSIS — D631 Anemia in chronic kidney disease: Secondary | ICD-10-CM | POA: Diagnosis not present

## 2020-03-18 DIAGNOSIS — I120 Hypertensive chronic kidney disease with stage 5 chronic kidney disease or end stage renal disease: Secondary | ICD-10-CM | POA: Diagnosis not present

## 2020-03-19 DIAGNOSIS — E213 Hyperparathyroidism, unspecified: Secondary | ICD-10-CM | POA: Diagnosis not present

## 2020-03-19 DIAGNOSIS — N185 Chronic kidney disease, stage 5: Secondary | ICD-10-CM | POA: Diagnosis not present

## 2020-03-20 DIAGNOSIS — N185 Chronic kidney disease, stage 5: Secondary | ICD-10-CM | POA: Diagnosis not present

## 2020-03-20 DIAGNOSIS — E1122 Type 2 diabetes mellitus with diabetic chronic kidney disease: Secondary | ICD-10-CM | POA: Diagnosis not present

## 2020-03-20 DIAGNOSIS — D631 Anemia in chronic kidney disease: Secondary | ICD-10-CM | POA: Diagnosis not present

## 2020-03-20 DIAGNOSIS — D5720 Sickle-cell/Hb-C disease without crisis: Secondary | ICD-10-CM | POA: Diagnosis not present

## 2020-03-20 DIAGNOSIS — Z992 Dependence on renal dialysis: Secondary | ICD-10-CM | POA: Diagnosis not present

## 2020-03-20 DIAGNOSIS — M79605 Pain in left leg: Secondary | ICD-10-CM | POA: Diagnosis not present

## 2020-03-20 DIAGNOSIS — R7989 Other specified abnormal findings of blood chemistry: Secondary | ICD-10-CM | POA: Diagnosis not present

## 2020-03-20 DIAGNOSIS — N189 Chronic kidney disease, unspecified: Secondary | ICD-10-CM | POA: Diagnosis not present

## 2020-03-20 DIAGNOSIS — D649 Anemia, unspecified: Secondary | ICD-10-CM | POA: Diagnosis not present

## 2020-03-20 DIAGNOSIS — D571 Sickle-cell disease without crisis: Secondary | ICD-10-CM | POA: Diagnosis not present

## 2020-03-26 DIAGNOSIS — I10 Essential (primary) hypertension: Secondary | ICD-10-CM | POA: Diagnosis not present

## 2020-03-26 DIAGNOSIS — N185 Chronic kidney disease, stage 5: Secondary | ICD-10-CM | POA: Diagnosis not present

## 2020-03-26 DIAGNOSIS — D571 Sickle-cell disease without crisis: Secondary | ICD-10-CM | POA: Diagnosis not present

## 2020-03-26 DIAGNOSIS — Z5189 Encounter for other specified aftercare: Secondary | ICD-10-CM | POA: Diagnosis not present

## 2020-03-26 DIAGNOSIS — Z7682 Awaiting organ transplant status: Secondary | ICD-10-CM | POA: Diagnosis not present

## 2020-03-26 DIAGNOSIS — D631 Anemia in chronic kidney disease: Secondary | ICD-10-CM | POA: Diagnosis not present

## 2020-03-26 DIAGNOSIS — D5720 Sickle-cell/Hb-C disease without crisis: Secondary | ICD-10-CM | POA: Diagnosis not present

## 2020-03-26 DIAGNOSIS — Z1159 Encounter for screening for other viral diseases: Secondary | ICD-10-CM | POA: Diagnosis not present

## 2020-03-26 DIAGNOSIS — E119 Type 2 diabetes mellitus without complications: Secondary | ICD-10-CM | POA: Diagnosis not present

## 2020-04-01 DIAGNOSIS — D509 Iron deficiency anemia, unspecified: Secondary | ICD-10-CM | POA: Insufficient documentation

## 2020-04-01 DIAGNOSIS — N189 Chronic kidney disease, unspecified: Secondary | ICD-10-CM | POA: Insufficient documentation

## 2020-04-01 DIAGNOSIS — D631 Anemia in chronic kidney disease: Secondary | ICD-10-CM | POA: Insufficient documentation

## 2020-04-01 DIAGNOSIS — Z992 Dependence on renal dialysis: Secondary | ICD-10-CM | POA: Diagnosis not present

## 2020-04-01 DIAGNOSIS — N186 End stage renal disease: Secondary | ICD-10-CM | POA: Diagnosis not present

## 2020-04-01 DIAGNOSIS — D689 Coagulation defect, unspecified: Secondary | ICD-10-CM | POA: Insufficient documentation

## 2020-04-01 DIAGNOSIS — N185 Chronic kidney disease, stage 5: Secondary | ICD-10-CM | POA: Diagnosis not present

## 2020-04-01 DIAGNOSIS — N2581 Secondary hyperparathyroidism of renal origin: Secondary | ICD-10-CM | POA: Insufficient documentation

## 2020-04-02 DIAGNOSIS — Z992 Dependence on renal dialysis: Secondary | ICD-10-CM | POA: Diagnosis not present

## 2020-04-02 DIAGNOSIS — N186 End stage renal disease: Secondary | ICD-10-CM | POA: Diagnosis not present

## 2020-04-02 DIAGNOSIS — N184 Chronic kidney disease, stage 4 (severe): Secondary | ICD-10-CM | POA: Diagnosis not present

## 2020-04-03 DIAGNOSIS — D5720 Sickle-cell/Hb-C disease without crisis: Secondary | ICD-10-CM | POA: Diagnosis not present

## 2020-04-03 DIAGNOSIS — N185 Chronic kidney disease, stage 5: Secondary | ICD-10-CM | POA: Diagnosis not present

## 2020-04-04 DIAGNOSIS — N2581 Secondary hyperparathyroidism of renal origin: Secondary | ICD-10-CM | POA: Diagnosis not present

## 2020-04-04 DIAGNOSIS — N186 End stage renal disease: Secondary | ICD-10-CM | POA: Diagnosis not present

## 2020-04-04 DIAGNOSIS — D571 Sickle-cell disease without crisis: Secondary | ICD-10-CM | POA: Diagnosis not present

## 2020-04-04 DIAGNOSIS — D5720 Sickle-cell/Hb-C disease without crisis: Secondary | ICD-10-CM | POA: Diagnosis not present

## 2020-04-04 DIAGNOSIS — Z992 Dependence on renal dialysis: Secondary | ICD-10-CM | POA: Diagnosis not present

## 2020-04-07 DIAGNOSIS — D571 Sickle-cell disease without crisis: Secondary | ICD-10-CM | POA: Diagnosis not present

## 2020-04-07 DIAGNOSIS — D5720 Sickle-cell/Hb-C disease without crisis: Secondary | ICD-10-CM | POA: Diagnosis not present

## 2020-04-07 DIAGNOSIS — N2581 Secondary hyperparathyroidism of renal origin: Secondary | ICD-10-CM | POA: Diagnosis not present

## 2020-04-07 DIAGNOSIS — N186 End stage renal disease: Secondary | ICD-10-CM | POA: Diagnosis not present

## 2020-04-07 DIAGNOSIS — E1129 Type 2 diabetes mellitus with other diabetic kidney complication: Secondary | ICD-10-CM

## 2020-04-07 DIAGNOSIS — Z992 Dependence on renal dialysis: Secondary | ICD-10-CM | POA: Diagnosis not present

## 2020-04-08 DIAGNOSIS — E1122 Type 2 diabetes mellitus with diabetic chronic kidney disease: Secondary | ICD-10-CM | POA: Diagnosis not present

## 2020-04-08 DIAGNOSIS — I120 Hypertensive chronic kidney disease with stage 5 chronic kidney disease or end stage renal disease: Secondary | ICD-10-CM | POA: Diagnosis not present

## 2020-04-08 DIAGNOSIS — Z01810 Encounter for preprocedural cardiovascular examination: Secondary | ICD-10-CM | POA: Diagnosis not present

## 2020-04-08 DIAGNOSIS — Z992 Dependence on renal dialysis: Secondary | ICD-10-CM | POA: Diagnosis not present

## 2020-04-08 DIAGNOSIS — N289 Disorder of kidney and ureter, unspecified: Secondary | ICD-10-CM | POA: Diagnosis not present

## 2020-04-08 DIAGNOSIS — E785 Hyperlipidemia, unspecified: Secondary | ICD-10-CM | POA: Diagnosis not present

## 2020-04-08 DIAGNOSIS — E119 Type 2 diabetes mellitus without complications: Secondary | ICD-10-CM | POA: Diagnosis not present

## 2020-04-08 DIAGNOSIS — N186 End stage renal disease: Secondary | ICD-10-CM | POA: Diagnosis not present

## 2020-04-08 DIAGNOSIS — D631 Anemia in chronic kidney disease: Secondary | ICD-10-CM | POA: Diagnosis not present

## 2020-04-08 DIAGNOSIS — Z8673 Personal history of transient ischemic attack (TIA), and cerebral infarction without residual deficits: Secondary | ICD-10-CM | POA: Diagnosis not present

## 2020-04-08 DIAGNOSIS — I10 Essential (primary) hypertension: Secondary | ICD-10-CM | POA: Diagnosis not present

## 2020-04-08 DIAGNOSIS — D5720 Sickle-cell/Hb-C disease without crisis: Secondary | ICD-10-CM | POA: Diagnosis not present

## 2020-04-09 DIAGNOSIS — D5720 Sickle-cell/Hb-C disease without crisis: Secondary | ICD-10-CM | POA: Diagnosis not present

## 2020-04-09 DIAGNOSIS — N2581 Secondary hyperparathyroidism of renal origin: Secondary | ICD-10-CM | POA: Diagnosis not present

## 2020-04-09 DIAGNOSIS — Z992 Dependence on renal dialysis: Secondary | ICD-10-CM | POA: Diagnosis not present

## 2020-04-09 DIAGNOSIS — N186 End stage renal disease: Secondary | ICD-10-CM | POA: Diagnosis not present

## 2020-04-09 DIAGNOSIS — D571 Sickle-cell disease without crisis: Secondary | ICD-10-CM | POA: Diagnosis not present

## 2020-04-11 DIAGNOSIS — N2581 Secondary hyperparathyroidism of renal origin: Secondary | ICD-10-CM | POA: Diagnosis not present

## 2020-04-11 DIAGNOSIS — N186 End stage renal disease: Secondary | ICD-10-CM | POA: Diagnosis not present

## 2020-04-11 DIAGNOSIS — Z992 Dependence on renal dialysis: Secondary | ICD-10-CM | POA: Diagnosis not present

## 2020-04-11 DIAGNOSIS — D5720 Sickle-cell/Hb-C disease without crisis: Secondary | ICD-10-CM | POA: Diagnosis not present

## 2020-04-11 DIAGNOSIS — D571 Sickle-cell disease without crisis: Secondary | ICD-10-CM | POA: Diagnosis not present

## 2020-04-14 DIAGNOSIS — N186 End stage renal disease: Secondary | ICD-10-CM | POA: Diagnosis not present

## 2020-04-14 DIAGNOSIS — Z992 Dependence on renal dialysis: Secondary | ICD-10-CM | POA: Diagnosis not present

## 2020-04-14 DIAGNOSIS — D571 Sickle-cell disease without crisis: Secondary | ICD-10-CM | POA: Diagnosis not present

## 2020-04-14 DIAGNOSIS — D5720 Sickle-cell/Hb-C disease without crisis: Secondary | ICD-10-CM | POA: Diagnosis not present

## 2020-04-14 DIAGNOSIS — N2581 Secondary hyperparathyroidism of renal origin: Secondary | ICD-10-CM | POA: Diagnosis not present

## 2020-04-17 DIAGNOSIS — D57219 Sickle-cell/Hb-C disease with crisis, unspecified: Secondary | ICD-10-CM | POA: Diagnosis not present

## 2020-04-17 DIAGNOSIS — Z01818 Encounter for other preprocedural examination: Secondary | ICD-10-CM | POA: Diagnosis not present

## 2020-04-17 DIAGNOSIS — R9431 Abnormal electrocardiogram [ECG] [EKG]: Secondary | ICD-10-CM | POA: Diagnosis not present

## 2020-04-17 DIAGNOSIS — E611 Iron deficiency: Secondary | ICD-10-CM | POA: Diagnosis not present

## 2020-04-17 DIAGNOSIS — Z992 Dependence on renal dialysis: Secondary | ICD-10-CM | POA: Diagnosis not present

## 2020-04-17 DIAGNOSIS — D571 Sickle-cell disease without crisis: Secondary | ICD-10-CM | POA: Diagnosis not present

## 2020-04-17 DIAGNOSIS — I120 Hypertensive chronic kidney disease with stage 5 chronic kidney disease or end stage renal disease: Secondary | ICD-10-CM | POA: Diagnosis not present

## 2020-04-17 DIAGNOSIS — N186 End stage renal disease: Secondary | ICD-10-CM | POA: Diagnosis not present

## 2020-04-17 DIAGNOSIS — I10 Essential (primary) hypertension: Secondary | ICD-10-CM | POA: Diagnosis not present

## 2020-04-17 DIAGNOSIS — D631 Anemia in chronic kidney disease: Secondary | ICD-10-CM | POA: Diagnosis not present

## 2020-04-17 DIAGNOSIS — E119 Type 2 diabetes mellitus without complications: Secondary | ICD-10-CM | POA: Diagnosis not present

## 2020-04-17 DIAGNOSIS — E1122 Type 2 diabetes mellitus with diabetic chronic kidney disease: Secondary | ICD-10-CM | POA: Diagnosis not present

## 2020-04-18 DIAGNOSIS — D571 Sickle-cell disease without crisis: Secondary | ICD-10-CM | POA: Diagnosis not present

## 2020-04-18 DIAGNOSIS — Z992 Dependence on renal dialysis: Secondary | ICD-10-CM | POA: Diagnosis not present

## 2020-04-18 DIAGNOSIS — N2581 Secondary hyperparathyroidism of renal origin: Secondary | ICD-10-CM | POA: Diagnosis not present

## 2020-04-18 DIAGNOSIS — R9431 Abnormal electrocardiogram [ECG] [EKG]: Secondary | ICD-10-CM | POA: Insufficient documentation

## 2020-04-18 DIAGNOSIS — D5720 Sickle-cell/Hb-C disease without crisis: Secondary | ICD-10-CM | POA: Diagnosis not present

## 2020-04-18 DIAGNOSIS — N186 End stage renal disease: Secondary | ICD-10-CM | POA: Diagnosis not present

## 2020-04-19 DIAGNOSIS — D571 Sickle-cell disease without crisis: Secondary | ICD-10-CM | POA: Diagnosis not present

## 2020-04-19 DIAGNOSIS — N186 End stage renal disease: Secondary | ICD-10-CM | POA: Diagnosis not present

## 2020-04-19 DIAGNOSIS — Z992 Dependence on renal dialysis: Secondary | ICD-10-CM | POA: Diagnosis not present

## 2020-04-21 DIAGNOSIS — N186 End stage renal disease: Secondary | ICD-10-CM | POA: Diagnosis not present

## 2020-04-21 DIAGNOSIS — N2581 Secondary hyperparathyroidism of renal origin: Secondary | ICD-10-CM | POA: Diagnosis not present

## 2020-04-21 DIAGNOSIS — D571 Sickle-cell disease without crisis: Secondary | ICD-10-CM | POA: Diagnosis not present

## 2020-04-21 DIAGNOSIS — Z992 Dependence on renal dialysis: Secondary | ICD-10-CM | POA: Diagnosis not present

## 2020-04-21 DIAGNOSIS — D5720 Sickle-cell/Hb-C disease without crisis: Secondary | ICD-10-CM | POA: Diagnosis not present

## 2020-04-23 DIAGNOSIS — N2581 Secondary hyperparathyroidism of renal origin: Secondary | ICD-10-CM | POA: Diagnosis not present

## 2020-04-23 DIAGNOSIS — D571 Sickle-cell disease without crisis: Secondary | ICD-10-CM | POA: Diagnosis not present

## 2020-04-23 DIAGNOSIS — N186 End stage renal disease: Secondary | ICD-10-CM | POA: Diagnosis not present

## 2020-04-23 DIAGNOSIS — Z992 Dependence on renal dialysis: Secondary | ICD-10-CM | POA: Diagnosis not present

## 2020-04-23 DIAGNOSIS — D5720 Sickle-cell/Hb-C disease without crisis: Secondary | ICD-10-CM | POA: Diagnosis not present

## 2020-04-25 DIAGNOSIS — D571 Sickle-cell disease without crisis: Secondary | ICD-10-CM | POA: Diagnosis not present

## 2020-04-25 DIAGNOSIS — D5720 Sickle-cell/Hb-C disease without crisis: Secondary | ICD-10-CM | POA: Diagnosis not present

## 2020-04-25 DIAGNOSIS — Z992 Dependence on renal dialysis: Secondary | ICD-10-CM | POA: Diagnosis not present

## 2020-04-25 DIAGNOSIS — N2581 Secondary hyperparathyroidism of renal origin: Secondary | ICD-10-CM | POA: Diagnosis not present

## 2020-04-25 DIAGNOSIS — N186 End stage renal disease: Secondary | ICD-10-CM | POA: Diagnosis not present

## 2020-04-28 DIAGNOSIS — N186 End stage renal disease: Secondary | ICD-10-CM | POA: Diagnosis not present

## 2020-04-28 DIAGNOSIS — D571 Sickle-cell disease without crisis: Secondary | ICD-10-CM | POA: Diagnosis not present

## 2020-04-28 DIAGNOSIS — Z992 Dependence on renal dialysis: Secondary | ICD-10-CM | POA: Diagnosis not present

## 2020-04-28 DIAGNOSIS — D5720 Sickle-cell/Hb-C disease without crisis: Secondary | ICD-10-CM | POA: Diagnosis not present

## 2020-04-28 DIAGNOSIS — N2581 Secondary hyperparathyroidism of renal origin: Secondary | ICD-10-CM | POA: Diagnosis not present

## 2020-04-30 DIAGNOSIS — N186 End stage renal disease: Secondary | ICD-10-CM | POA: Diagnosis not present

## 2020-04-30 DIAGNOSIS — I10 Essential (primary) hypertension: Secondary | ICD-10-CM | POA: Diagnosis not present

## 2020-04-30 DIAGNOSIS — Z992 Dependence on renal dialysis: Secondary | ICD-10-CM | POA: Diagnosis not present

## 2020-05-01 DIAGNOSIS — N2581 Secondary hyperparathyroidism of renal origin: Secondary | ICD-10-CM | POA: Diagnosis not present

## 2020-05-01 DIAGNOSIS — Z992 Dependence on renal dialysis: Secondary | ICD-10-CM | POA: Diagnosis not present

## 2020-05-01 DIAGNOSIS — D571 Sickle-cell disease without crisis: Secondary | ICD-10-CM | POA: Diagnosis not present

## 2020-05-01 DIAGNOSIS — D5720 Sickle-cell/Hb-C disease without crisis: Secondary | ICD-10-CM | POA: Diagnosis not present

## 2020-05-01 DIAGNOSIS — N186 End stage renal disease: Secondary | ICD-10-CM | POA: Diagnosis not present

## 2020-05-03 DIAGNOSIS — M852 Hyperostosis of skull: Secondary | ICD-10-CM | POA: Diagnosis not present

## 2020-05-03 DIAGNOSIS — R9431 Abnormal electrocardiogram [ECG] [EKG]: Secondary | ICD-10-CM | POA: Diagnosis not present

## 2020-05-03 DIAGNOSIS — R2681 Unsteadiness on feet: Secondary | ICD-10-CM | POA: Diagnosis not present

## 2020-05-03 DIAGNOSIS — G9389 Other specified disorders of brain: Secondary | ICD-10-CM | POA: Diagnosis not present

## 2020-05-03 DIAGNOSIS — R001 Bradycardia, unspecified: Secondary | ICD-10-CM | POA: Diagnosis not present

## 2020-05-03 DIAGNOSIS — I10 Essential (primary) hypertension: Secondary | ICD-10-CM | POA: Diagnosis not present

## 2020-05-03 DIAGNOSIS — R519 Headache, unspecified: Secondary | ICD-10-CM | POA: Diagnosis not present

## 2020-05-03 DIAGNOSIS — R52 Pain, unspecified: Secondary | ICD-10-CM | POA: Diagnosis not present

## 2020-05-03 DIAGNOSIS — G4489 Other headache syndrome: Secondary | ICD-10-CM | POA: Diagnosis not present

## 2020-05-03 DIAGNOSIS — I499 Cardiac arrhythmia, unspecified: Secondary | ICD-10-CM | POA: Diagnosis not present

## 2020-05-03 DIAGNOSIS — G319 Degenerative disease of nervous system, unspecified: Secondary | ICD-10-CM | POA: Diagnosis not present

## 2020-05-03 DIAGNOSIS — I6782 Cerebral ischemia: Secondary | ICD-10-CM | POA: Diagnosis not present

## 2020-05-05 DIAGNOSIS — D5720 Sickle-cell/Hb-C disease without crisis: Secondary | ICD-10-CM | POA: Diagnosis not present

## 2020-05-05 DIAGNOSIS — D571 Sickle-cell disease without crisis: Secondary | ICD-10-CM | POA: Diagnosis not present

## 2020-05-05 DIAGNOSIS — N186 End stage renal disease: Secondary | ICD-10-CM | POA: Diagnosis not present

## 2020-05-05 DIAGNOSIS — R001 Bradycardia, unspecified: Secondary | ICD-10-CM | POA: Diagnosis not present

## 2020-05-05 DIAGNOSIS — I517 Cardiomegaly: Secondary | ICD-10-CM | POA: Diagnosis not present

## 2020-05-05 DIAGNOSIS — N2581 Secondary hyperparathyroidism of renal origin: Secondary | ICD-10-CM | POA: Diagnosis not present

## 2020-05-05 DIAGNOSIS — Z992 Dependence on renal dialysis: Secondary | ICD-10-CM | POA: Diagnosis not present

## 2020-05-07 DIAGNOSIS — D571 Sickle-cell disease without crisis: Secondary | ICD-10-CM | POA: Diagnosis not present

## 2020-05-07 DIAGNOSIS — I120 Hypertensive chronic kidney disease with stage 5 chronic kidney disease or end stage renal disease: Secondary | ICD-10-CM | POA: Diagnosis not present

## 2020-05-07 DIAGNOSIS — N186 End stage renal disease: Secondary | ICD-10-CM | POA: Diagnosis not present

## 2020-05-07 DIAGNOSIS — I160 Hypertensive urgency: Secondary | ICD-10-CM | POA: Diagnosis not present

## 2020-05-07 DIAGNOSIS — D72829 Elevated white blood cell count, unspecified: Secondary | ICD-10-CM | POA: Diagnosis not present

## 2020-05-07 DIAGNOSIS — R519 Headache, unspecified: Secondary | ICD-10-CM | POA: Diagnosis not present

## 2020-05-07 DIAGNOSIS — N189 Chronic kidney disease, unspecified: Secondary | ICD-10-CM | POA: Diagnosis not present

## 2020-05-07 DIAGNOSIS — N250 Renal osteodystrophy: Secondary | ICD-10-CM | POA: Diagnosis not present

## 2020-05-07 DIAGNOSIS — I10 Essential (primary) hypertension: Secondary | ICD-10-CM | POA: Diagnosis not present

## 2020-05-07 DIAGNOSIS — G9389 Other specified disorders of brain: Secondary | ICD-10-CM | POA: Diagnosis not present

## 2020-05-07 DIAGNOSIS — I6782 Cerebral ischemia: Secondary | ICD-10-CM | POA: Diagnosis not present

## 2020-05-07 DIAGNOSIS — D631 Anemia in chronic kidney disease: Secondary | ICD-10-CM | POA: Diagnosis not present

## 2020-05-07 DIAGNOSIS — R918 Other nonspecific abnormal finding of lung field: Secondary | ICD-10-CM | POA: Diagnosis not present

## 2020-05-07 DIAGNOSIS — M899 Disorder of bone, unspecified: Secondary | ICD-10-CM | POA: Diagnosis not present

## 2020-05-07 DIAGNOSIS — E1122 Type 2 diabetes mellitus with diabetic chronic kidney disease: Secondary | ICD-10-CM | POA: Diagnosis not present

## 2020-05-07 DIAGNOSIS — Z992 Dependence on renal dialysis: Secondary | ICD-10-CM | POA: Diagnosis not present

## 2020-05-08 DIAGNOSIS — I160 Hypertensive urgency: Secondary | ICD-10-CM | POA: Insufficient documentation

## 2020-05-08 DIAGNOSIS — N186 End stage renal disease: Secondary | ICD-10-CM | POA: Diagnosis not present

## 2020-05-08 DIAGNOSIS — I6782 Cerebral ischemia: Secondary | ICD-10-CM | POA: Diagnosis not present

## 2020-05-08 DIAGNOSIS — R519 Headache, unspecified: Secondary | ICD-10-CM | POA: Diagnosis not present

## 2020-05-08 DIAGNOSIS — G9389 Other specified disorders of brain: Secondary | ICD-10-CM | POA: Diagnosis not present

## 2020-05-08 DIAGNOSIS — Z992 Dependence on renal dialysis: Secondary | ICD-10-CM | POA: Diagnosis not present

## 2020-05-09 DIAGNOSIS — N186 End stage renal disease: Secondary | ICD-10-CM | POA: Diagnosis not present

## 2020-05-09 DIAGNOSIS — R519 Headache, unspecified: Secondary | ICD-10-CM | POA: Diagnosis not present

## 2020-05-09 DIAGNOSIS — D5720 Sickle-cell/Hb-C disease without crisis: Secondary | ICD-10-CM | POA: Diagnosis not present

## 2020-05-09 DIAGNOSIS — Z992 Dependence on renal dialysis: Secondary | ICD-10-CM | POA: Diagnosis not present

## 2020-05-09 DIAGNOSIS — D571 Sickle-cell disease without crisis: Secondary | ICD-10-CM | POA: Diagnosis not present

## 2020-05-09 DIAGNOSIS — I160 Hypertensive urgency: Secondary | ICD-10-CM | POA: Diagnosis not present

## 2020-05-09 DIAGNOSIS — N2581 Secondary hyperparathyroidism of renal origin: Secondary | ICD-10-CM | POA: Diagnosis not present

## 2020-05-12 DIAGNOSIS — N186 End stage renal disease: Secondary | ICD-10-CM | POA: Diagnosis not present

## 2020-05-12 DIAGNOSIS — Z992 Dependence on renal dialysis: Secondary | ICD-10-CM | POA: Diagnosis not present

## 2020-05-12 DIAGNOSIS — D5720 Sickle-cell/Hb-C disease without crisis: Secondary | ICD-10-CM | POA: Diagnosis not present

## 2020-05-12 DIAGNOSIS — N2581 Secondary hyperparathyroidism of renal origin: Secondary | ICD-10-CM | POA: Diagnosis not present

## 2020-05-12 DIAGNOSIS — D571 Sickle-cell disease without crisis: Secondary | ICD-10-CM | POA: Diagnosis not present

## 2020-05-14 DIAGNOSIS — N186 End stage renal disease: Secondary | ICD-10-CM | POA: Diagnosis not present

## 2020-05-14 DIAGNOSIS — Z992 Dependence on renal dialysis: Secondary | ICD-10-CM | POA: Diagnosis not present

## 2020-05-14 DIAGNOSIS — D571 Sickle-cell disease without crisis: Secondary | ICD-10-CM | POA: Diagnosis not present

## 2020-05-14 DIAGNOSIS — N2581 Secondary hyperparathyroidism of renal origin: Secondary | ICD-10-CM | POA: Diagnosis not present

## 2020-05-14 DIAGNOSIS — D5720 Sickle-cell/Hb-C disease without crisis: Secondary | ICD-10-CM | POA: Diagnosis not present

## 2020-05-15 DIAGNOSIS — Z992 Dependence on renal dialysis: Secondary | ICD-10-CM | POA: Diagnosis not present

## 2020-05-15 DIAGNOSIS — N186 End stage renal disease: Secondary | ICD-10-CM | POA: Diagnosis not present

## 2020-05-16 DIAGNOSIS — D5720 Sickle-cell/Hb-C disease without crisis: Secondary | ICD-10-CM | POA: Diagnosis not present

## 2020-05-16 DIAGNOSIS — N186 End stage renal disease: Secondary | ICD-10-CM | POA: Diagnosis not present

## 2020-05-16 DIAGNOSIS — N2581 Secondary hyperparathyroidism of renal origin: Secondary | ICD-10-CM | POA: Diagnosis not present

## 2020-05-16 DIAGNOSIS — D571 Sickle-cell disease without crisis: Secondary | ICD-10-CM | POA: Diagnosis not present

## 2020-05-16 DIAGNOSIS — Z992 Dependence on renal dialysis: Secondary | ICD-10-CM | POA: Diagnosis not present

## 2020-05-19 DIAGNOSIS — D571 Sickle-cell disease without crisis: Secondary | ICD-10-CM | POA: Diagnosis not present

## 2020-05-19 DIAGNOSIS — N186 End stage renal disease: Secondary | ICD-10-CM | POA: Diagnosis not present

## 2020-05-19 DIAGNOSIS — N2581 Secondary hyperparathyroidism of renal origin: Secondary | ICD-10-CM | POA: Diagnosis not present

## 2020-05-19 DIAGNOSIS — D5720 Sickle-cell/Hb-C disease without crisis: Secondary | ICD-10-CM | POA: Diagnosis not present

## 2020-05-19 DIAGNOSIS — Z992 Dependence on renal dialysis: Secondary | ICD-10-CM | POA: Diagnosis not present

## 2020-05-20 DIAGNOSIS — N185 Chronic kidney disease, stage 5: Secondary | ICD-10-CM | POA: Diagnosis not present

## 2020-05-20 DIAGNOSIS — I10 Essential (primary) hypertension: Secondary | ICD-10-CM | POA: Diagnosis not present

## 2020-05-20 DIAGNOSIS — G8929 Other chronic pain: Secondary | ICD-10-CM | POA: Diagnosis not present

## 2020-05-20 DIAGNOSIS — Z992 Dependence on renal dialysis: Secondary | ICD-10-CM | POA: Diagnosis not present

## 2020-05-20 DIAGNOSIS — D5720 Sickle-cell/Hb-C disease without crisis: Secondary | ICD-10-CM | POA: Diagnosis not present

## 2020-05-20 DIAGNOSIS — D631 Anemia in chronic kidney disease: Secondary | ICD-10-CM | POA: Diagnosis not present

## 2020-05-20 DIAGNOSIS — N186 End stage renal disease: Secondary | ICD-10-CM | POA: Diagnosis not present

## 2020-05-20 DIAGNOSIS — D571 Sickle-cell disease without crisis: Secondary | ICD-10-CM | POA: Diagnosis not present

## 2020-05-21 DIAGNOSIS — N2581 Secondary hyperparathyroidism of renal origin: Secondary | ICD-10-CM | POA: Diagnosis not present

## 2020-05-21 DIAGNOSIS — N186 End stage renal disease: Secondary | ICD-10-CM | POA: Diagnosis not present

## 2020-05-21 DIAGNOSIS — Z992 Dependence on renal dialysis: Secondary | ICD-10-CM | POA: Diagnosis not present

## 2020-05-21 DIAGNOSIS — D5720 Sickle-cell/Hb-C disease without crisis: Secondary | ICD-10-CM | POA: Diagnosis not present

## 2020-05-21 DIAGNOSIS — D571 Sickle-cell disease without crisis: Secondary | ICD-10-CM | POA: Diagnosis not present

## 2020-05-23 DIAGNOSIS — D571 Sickle-cell disease without crisis: Secondary | ICD-10-CM | POA: Diagnosis not present

## 2020-05-23 DIAGNOSIS — D5720 Sickle-cell/Hb-C disease without crisis: Secondary | ICD-10-CM | POA: Diagnosis not present

## 2020-05-23 DIAGNOSIS — Z992 Dependence on renal dialysis: Secondary | ICD-10-CM | POA: Diagnosis not present

## 2020-05-23 DIAGNOSIS — N186 End stage renal disease: Secondary | ICD-10-CM | POA: Diagnosis not present

## 2020-05-23 DIAGNOSIS — N2581 Secondary hyperparathyroidism of renal origin: Secondary | ICD-10-CM | POA: Diagnosis not present

## 2020-05-26 DIAGNOSIS — N186 End stage renal disease: Secondary | ICD-10-CM | POA: Diagnosis not present

## 2020-05-26 DIAGNOSIS — D5720 Sickle-cell/Hb-C disease without crisis: Secondary | ICD-10-CM | POA: Diagnosis not present

## 2020-05-26 DIAGNOSIS — D571 Sickle-cell disease without crisis: Secondary | ICD-10-CM | POA: Diagnosis not present

## 2020-05-26 DIAGNOSIS — N2581 Secondary hyperparathyroidism of renal origin: Secondary | ICD-10-CM | POA: Diagnosis not present

## 2020-05-26 DIAGNOSIS — Z992 Dependence on renal dialysis: Secondary | ICD-10-CM | POA: Diagnosis not present

## 2020-05-28 DIAGNOSIS — D571 Sickle-cell disease without crisis: Secondary | ICD-10-CM | POA: Diagnosis not present

## 2020-05-28 DIAGNOSIS — N2581 Secondary hyperparathyroidism of renal origin: Secondary | ICD-10-CM | POA: Diagnosis not present

## 2020-05-28 DIAGNOSIS — D5720 Sickle-cell/Hb-C disease without crisis: Secondary | ICD-10-CM | POA: Diagnosis not present

## 2020-05-28 DIAGNOSIS — N186 End stage renal disease: Secondary | ICD-10-CM | POA: Diagnosis not present

## 2020-05-28 DIAGNOSIS — Z992 Dependence on renal dialysis: Secondary | ICD-10-CM | POA: Diagnosis not present

## 2020-05-30 DIAGNOSIS — Z992 Dependence on renal dialysis: Secondary | ICD-10-CM | POA: Diagnosis not present

## 2020-05-30 DIAGNOSIS — D5720 Sickle-cell/Hb-C disease without crisis: Secondary | ICD-10-CM | POA: Diagnosis not present

## 2020-05-30 DIAGNOSIS — N2581 Secondary hyperparathyroidism of renal origin: Secondary | ICD-10-CM | POA: Diagnosis not present

## 2020-05-30 DIAGNOSIS — D571 Sickle-cell disease without crisis: Secondary | ICD-10-CM | POA: Diagnosis not present

## 2020-05-30 DIAGNOSIS — N186 End stage renal disease: Secondary | ICD-10-CM | POA: Diagnosis not present

## 2020-06-02 DIAGNOSIS — D571 Sickle-cell disease without crisis: Secondary | ICD-10-CM | POA: Diagnosis not present

## 2020-06-02 DIAGNOSIS — N186 End stage renal disease: Secondary | ICD-10-CM | POA: Diagnosis not present

## 2020-06-02 DIAGNOSIS — Z992 Dependence on renal dialysis: Secondary | ICD-10-CM | POA: Diagnosis not present

## 2020-06-02 DIAGNOSIS — N2581 Secondary hyperparathyroidism of renal origin: Secondary | ICD-10-CM | POA: Diagnosis not present

## 2020-06-02 DIAGNOSIS — D5720 Sickle-cell/Hb-C disease without crisis: Secondary | ICD-10-CM | POA: Diagnosis not present

## 2020-06-04 DIAGNOSIS — D571 Sickle-cell disease without crisis: Secondary | ICD-10-CM | POA: Diagnosis not present

## 2020-06-04 DIAGNOSIS — N2581 Secondary hyperparathyroidism of renal origin: Secondary | ICD-10-CM | POA: Diagnosis not present

## 2020-06-04 DIAGNOSIS — D5720 Sickle-cell/Hb-C disease without crisis: Secondary | ICD-10-CM | POA: Diagnosis not present

## 2020-06-04 DIAGNOSIS — Z992 Dependence on renal dialysis: Secondary | ICD-10-CM | POA: Diagnosis not present

## 2020-06-04 DIAGNOSIS — N186 End stage renal disease: Secondary | ICD-10-CM | POA: Diagnosis not present

## 2020-06-06 DIAGNOSIS — D571 Sickle-cell disease without crisis: Secondary | ICD-10-CM | POA: Diagnosis not present

## 2020-06-06 DIAGNOSIS — D5720 Sickle-cell/Hb-C disease without crisis: Secondary | ICD-10-CM | POA: Diagnosis not present

## 2020-06-06 DIAGNOSIS — N186 End stage renal disease: Secondary | ICD-10-CM | POA: Diagnosis not present

## 2020-06-06 DIAGNOSIS — Z992 Dependence on renal dialysis: Secondary | ICD-10-CM | POA: Diagnosis not present

## 2020-06-06 DIAGNOSIS — N2581 Secondary hyperparathyroidism of renal origin: Secondary | ICD-10-CM | POA: Diagnosis not present

## 2020-06-09 DIAGNOSIS — N186 End stage renal disease: Secondary | ICD-10-CM | POA: Diagnosis not present

## 2020-06-09 DIAGNOSIS — D5720 Sickle-cell/Hb-C disease without crisis: Secondary | ICD-10-CM | POA: Diagnosis not present

## 2020-06-09 DIAGNOSIS — N2581 Secondary hyperparathyroidism of renal origin: Secondary | ICD-10-CM | POA: Diagnosis not present

## 2020-06-09 DIAGNOSIS — Z992 Dependence on renal dialysis: Secondary | ICD-10-CM | POA: Diagnosis not present

## 2020-06-09 DIAGNOSIS — D571 Sickle-cell disease without crisis: Secondary | ICD-10-CM | POA: Diagnosis not present

## 2020-06-10 DIAGNOSIS — Z01810 Encounter for preprocedural cardiovascular examination: Secondary | ICD-10-CM | POA: Diagnosis not present

## 2020-06-10 DIAGNOSIS — Z1231 Encounter for screening mammogram for malignant neoplasm of breast: Secondary | ICD-10-CM | POA: Diagnosis not present

## 2020-06-10 DIAGNOSIS — I517 Cardiomegaly: Secondary | ICD-10-CM | POA: Diagnosis not present

## 2020-06-10 DIAGNOSIS — T7840XA Allergy, unspecified, initial encounter: Secondary | ICD-10-CM | POA: Insufficient documentation

## 2020-06-10 DIAGNOSIS — T782XXA Anaphylactic shock, unspecified, initial encounter: Secondary | ICD-10-CM | POA: Insufficient documentation

## 2020-06-10 DIAGNOSIS — N281 Cyst of kidney, acquired: Secondary | ICD-10-CM | POA: Diagnosis not present

## 2020-06-11 DIAGNOSIS — D571 Sickle-cell disease without crisis: Secondary | ICD-10-CM | POA: Diagnosis not present

## 2020-06-11 DIAGNOSIS — Z992 Dependence on renal dialysis: Secondary | ICD-10-CM | POA: Diagnosis not present

## 2020-06-11 DIAGNOSIS — N186 End stage renal disease: Secondary | ICD-10-CM | POA: Diagnosis not present

## 2020-06-11 DIAGNOSIS — N2581 Secondary hyperparathyroidism of renal origin: Secondary | ICD-10-CM | POA: Diagnosis not present

## 2020-06-11 DIAGNOSIS — D5720 Sickle-cell/Hb-C disease without crisis: Secondary | ICD-10-CM | POA: Diagnosis not present

## 2020-06-12 DIAGNOSIS — Z992 Dependence on renal dialysis: Secondary | ICD-10-CM | POA: Insufficient documentation

## 2020-06-13 DIAGNOSIS — D571 Sickle-cell disease without crisis: Secondary | ICD-10-CM | POA: Diagnosis not present

## 2020-06-13 DIAGNOSIS — N2581 Secondary hyperparathyroidism of renal origin: Secondary | ICD-10-CM | POA: Diagnosis not present

## 2020-06-13 DIAGNOSIS — D5720 Sickle-cell/Hb-C disease without crisis: Secondary | ICD-10-CM | POA: Diagnosis not present

## 2020-06-13 DIAGNOSIS — Z992 Dependence on renal dialysis: Secondary | ICD-10-CM | POA: Diagnosis not present

## 2020-06-13 DIAGNOSIS — N186 End stage renal disease: Secondary | ICD-10-CM | POA: Diagnosis not present

## 2020-06-16 DIAGNOSIS — D571 Sickle-cell disease without crisis: Secondary | ICD-10-CM | POA: Diagnosis not present

## 2020-06-16 DIAGNOSIS — N186 End stage renal disease: Secondary | ICD-10-CM | POA: Diagnosis not present

## 2020-06-16 DIAGNOSIS — N2581 Secondary hyperparathyroidism of renal origin: Secondary | ICD-10-CM | POA: Diagnosis not present

## 2020-06-16 DIAGNOSIS — Z992 Dependence on renal dialysis: Secondary | ICD-10-CM | POA: Diagnosis not present

## 2020-06-16 DIAGNOSIS — D5720 Sickle-cell/Hb-C disease without crisis: Secondary | ICD-10-CM | POA: Diagnosis not present

## 2020-06-19 DIAGNOSIS — D5720 Sickle-cell/Hb-C disease without crisis: Secondary | ICD-10-CM | POA: Diagnosis not present

## 2020-06-19 DIAGNOSIS — D571 Sickle-cell disease without crisis: Secondary | ICD-10-CM | POA: Diagnosis not present

## 2020-06-19 DIAGNOSIS — Z992 Dependence on renal dialysis: Secondary | ICD-10-CM | POA: Diagnosis not present

## 2020-06-19 DIAGNOSIS — N186 End stage renal disease: Secondary | ICD-10-CM | POA: Diagnosis not present

## 2020-06-19 DIAGNOSIS — N2581 Secondary hyperparathyroidism of renal origin: Secondary | ICD-10-CM | POA: Diagnosis not present

## 2020-06-20 DIAGNOSIS — N186 End stage renal disease: Secondary | ICD-10-CM | POA: Diagnosis not present

## 2020-06-20 DIAGNOSIS — D5720 Sickle-cell/Hb-C disease without crisis: Secondary | ICD-10-CM | POA: Diagnosis not present

## 2020-06-20 DIAGNOSIS — D571 Sickle-cell disease without crisis: Secondary | ICD-10-CM | POA: Diagnosis not present

## 2020-06-20 DIAGNOSIS — N2581 Secondary hyperparathyroidism of renal origin: Secondary | ICD-10-CM | POA: Diagnosis not present

## 2020-06-20 DIAGNOSIS — Z992 Dependence on renal dialysis: Secondary | ICD-10-CM | POA: Diagnosis not present

## 2020-06-23 DIAGNOSIS — N2581 Secondary hyperparathyroidism of renal origin: Secondary | ICD-10-CM | POA: Diagnosis not present

## 2020-06-23 DIAGNOSIS — N186 End stage renal disease: Secondary | ICD-10-CM | POA: Diagnosis not present

## 2020-06-23 DIAGNOSIS — D571 Sickle-cell disease without crisis: Secondary | ICD-10-CM | POA: Diagnosis not present

## 2020-06-23 DIAGNOSIS — Z992 Dependence on renal dialysis: Secondary | ICD-10-CM | POA: Diagnosis not present

## 2020-06-23 DIAGNOSIS — D5720 Sickle-cell/Hb-C disease without crisis: Secondary | ICD-10-CM | POA: Diagnosis not present

## 2020-06-25 DIAGNOSIS — N186 End stage renal disease: Secondary | ICD-10-CM | POA: Diagnosis not present

## 2020-06-25 DIAGNOSIS — D5720 Sickle-cell/Hb-C disease without crisis: Secondary | ICD-10-CM | POA: Diagnosis not present

## 2020-06-25 DIAGNOSIS — D571 Sickle-cell disease without crisis: Secondary | ICD-10-CM | POA: Diagnosis not present

## 2020-06-25 DIAGNOSIS — N2581 Secondary hyperparathyroidism of renal origin: Secondary | ICD-10-CM | POA: Diagnosis not present

## 2020-06-25 DIAGNOSIS — Z992 Dependence on renal dialysis: Secondary | ICD-10-CM | POA: Diagnosis not present

## 2020-06-27 DIAGNOSIS — Z992 Dependence on renal dialysis: Secondary | ICD-10-CM | POA: Diagnosis not present

## 2020-06-27 DIAGNOSIS — N186 End stage renal disease: Secondary | ICD-10-CM | POA: Diagnosis not present

## 2020-06-27 DIAGNOSIS — D571 Sickle-cell disease without crisis: Secondary | ICD-10-CM | POA: Diagnosis not present

## 2020-06-27 DIAGNOSIS — N2581 Secondary hyperparathyroidism of renal origin: Secondary | ICD-10-CM | POA: Diagnosis not present

## 2020-06-27 DIAGNOSIS — D5720 Sickle-cell/Hb-C disease without crisis: Secondary | ICD-10-CM | POA: Diagnosis not present

## 2020-06-30 DIAGNOSIS — D5720 Sickle-cell/Hb-C disease without crisis: Secondary | ICD-10-CM | POA: Diagnosis not present

## 2020-06-30 DIAGNOSIS — D571 Sickle-cell disease without crisis: Secondary | ICD-10-CM | POA: Diagnosis not present

## 2020-06-30 DIAGNOSIS — N2581 Secondary hyperparathyroidism of renal origin: Secondary | ICD-10-CM | POA: Diagnosis not present

## 2020-06-30 DIAGNOSIS — N186 End stage renal disease: Secondary | ICD-10-CM | POA: Diagnosis not present

## 2020-06-30 DIAGNOSIS — Z992 Dependence on renal dialysis: Secondary | ICD-10-CM | POA: Diagnosis not present

## 2020-07-02 DIAGNOSIS — N2581 Secondary hyperparathyroidism of renal origin: Secondary | ICD-10-CM | POA: Diagnosis not present

## 2020-07-02 DIAGNOSIS — N186 End stage renal disease: Secondary | ICD-10-CM | POA: Diagnosis not present

## 2020-07-02 DIAGNOSIS — Z992 Dependence on renal dialysis: Secondary | ICD-10-CM | POA: Diagnosis not present

## 2020-07-02 DIAGNOSIS — D571 Sickle-cell disease without crisis: Secondary | ICD-10-CM | POA: Diagnosis not present

## 2020-07-02 DIAGNOSIS — D5720 Sickle-cell/Hb-C disease without crisis: Secondary | ICD-10-CM | POA: Diagnosis not present

## 2020-07-03 DIAGNOSIS — E1122 Type 2 diabetes mellitus with diabetic chronic kidney disease: Secondary | ICD-10-CM | POA: Diagnosis not present

## 2020-07-03 DIAGNOSIS — Z23 Encounter for immunization: Secondary | ICD-10-CM | POA: Diagnosis not present

## 2020-07-03 DIAGNOSIS — Z79899 Other long term (current) drug therapy: Secondary | ICD-10-CM | POA: Diagnosis not present

## 2020-07-03 DIAGNOSIS — Z992 Dependence on renal dialysis: Secondary | ICD-10-CM | POA: Diagnosis not present

## 2020-07-03 DIAGNOSIS — I120 Hypertensive chronic kidney disease with stage 5 chronic kidney disease or end stage renal disease: Secondary | ICD-10-CM | POA: Diagnosis not present

## 2020-07-03 DIAGNOSIS — N186 End stage renal disease: Secondary | ICD-10-CM | POA: Diagnosis not present

## 2020-07-04 DIAGNOSIS — D5720 Sickle-cell/Hb-C disease without crisis: Secondary | ICD-10-CM | POA: Diagnosis not present

## 2020-07-04 DIAGNOSIS — N2581 Secondary hyperparathyroidism of renal origin: Secondary | ICD-10-CM | POA: Diagnosis not present

## 2020-07-04 DIAGNOSIS — D571 Sickle-cell disease without crisis: Secondary | ICD-10-CM | POA: Diagnosis not present

## 2020-07-04 DIAGNOSIS — N186 End stage renal disease: Secondary | ICD-10-CM | POA: Diagnosis not present

## 2020-07-04 DIAGNOSIS — Z992 Dependence on renal dialysis: Secondary | ICD-10-CM | POA: Diagnosis not present

## 2020-07-07 ENCOUNTER — Other Ambulatory Visit: Payer: Self-pay | Admitting: *Deleted

## 2020-07-07 DIAGNOSIS — N186 End stage renal disease: Secondary | ICD-10-CM

## 2020-07-08 DIAGNOSIS — N186 End stage renal disease: Secondary | ICD-10-CM | POA: Diagnosis not present

## 2020-07-08 DIAGNOSIS — D5720 Sickle-cell/Hb-C disease without crisis: Secondary | ICD-10-CM | POA: Diagnosis not present

## 2020-07-08 DIAGNOSIS — Z992 Dependence on renal dialysis: Secondary | ICD-10-CM | POA: Diagnosis not present

## 2020-07-08 DIAGNOSIS — D571 Sickle-cell disease without crisis: Secondary | ICD-10-CM | POA: Diagnosis not present

## 2020-07-08 DIAGNOSIS — N2581 Secondary hyperparathyroidism of renal origin: Secondary | ICD-10-CM | POA: Diagnosis not present

## 2020-07-09 DIAGNOSIS — N186 End stage renal disease: Secondary | ICD-10-CM | POA: Diagnosis not present

## 2020-07-09 DIAGNOSIS — D5720 Sickle-cell/Hb-C disease without crisis: Secondary | ICD-10-CM | POA: Diagnosis not present

## 2020-07-09 DIAGNOSIS — D571 Sickle-cell disease without crisis: Secondary | ICD-10-CM | POA: Diagnosis not present

## 2020-07-09 DIAGNOSIS — Z992 Dependence on renal dialysis: Secondary | ICD-10-CM | POA: Diagnosis not present

## 2020-07-09 DIAGNOSIS — N2581 Secondary hyperparathyroidism of renal origin: Secondary | ICD-10-CM | POA: Diagnosis not present

## 2020-07-11 DIAGNOSIS — D571 Sickle-cell disease without crisis: Secondary | ICD-10-CM | POA: Diagnosis not present

## 2020-07-11 DIAGNOSIS — N2581 Secondary hyperparathyroidism of renal origin: Secondary | ICD-10-CM | POA: Diagnosis not present

## 2020-07-11 DIAGNOSIS — D5720 Sickle-cell/Hb-C disease without crisis: Secondary | ICD-10-CM | POA: Diagnosis not present

## 2020-07-11 DIAGNOSIS — N186 End stage renal disease: Secondary | ICD-10-CM | POA: Diagnosis not present

## 2020-07-11 DIAGNOSIS — Z992 Dependence on renal dialysis: Secondary | ICD-10-CM | POA: Diagnosis not present

## 2020-07-14 DIAGNOSIS — N2581 Secondary hyperparathyroidism of renal origin: Secondary | ICD-10-CM | POA: Diagnosis not present

## 2020-07-14 DIAGNOSIS — D571 Sickle-cell disease without crisis: Secondary | ICD-10-CM | POA: Diagnosis not present

## 2020-07-14 DIAGNOSIS — Z992 Dependence on renal dialysis: Secondary | ICD-10-CM | POA: Diagnosis not present

## 2020-07-14 DIAGNOSIS — N186 End stage renal disease: Secondary | ICD-10-CM | POA: Diagnosis not present

## 2020-07-14 DIAGNOSIS — D5720 Sickle-cell/Hb-C disease without crisis: Secondary | ICD-10-CM | POA: Diagnosis not present

## 2020-07-15 DIAGNOSIS — U071 COVID-19: Secondary | ICD-10-CM | POA: Diagnosis not present

## 2020-07-17 DIAGNOSIS — D5720 Sickle-cell/Hb-C disease without crisis: Secondary | ICD-10-CM | POA: Diagnosis not present

## 2020-07-17 DIAGNOSIS — D571 Sickle-cell disease without crisis: Secondary | ICD-10-CM | POA: Diagnosis not present

## 2020-07-17 DIAGNOSIS — N186 End stage renal disease: Secondary | ICD-10-CM | POA: Diagnosis not present

## 2020-07-17 DIAGNOSIS — Z992 Dependence on renal dialysis: Secondary | ICD-10-CM | POA: Diagnosis not present

## 2020-07-17 DIAGNOSIS — N2581 Secondary hyperparathyroidism of renal origin: Secondary | ICD-10-CM | POA: Diagnosis not present

## 2020-07-18 DIAGNOSIS — N2581 Secondary hyperparathyroidism of renal origin: Secondary | ICD-10-CM | POA: Diagnosis not present

## 2020-07-18 DIAGNOSIS — D571 Sickle-cell disease without crisis: Secondary | ICD-10-CM | POA: Diagnosis not present

## 2020-07-18 DIAGNOSIS — N186 End stage renal disease: Secondary | ICD-10-CM | POA: Diagnosis not present

## 2020-07-18 DIAGNOSIS — D5720 Sickle-cell/Hb-C disease without crisis: Secondary | ICD-10-CM | POA: Diagnosis not present

## 2020-07-18 DIAGNOSIS — Z992 Dependence on renal dialysis: Secondary | ICD-10-CM | POA: Diagnosis not present

## 2020-07-20 DIAGNOSIS — D571 Sickle-cell disease without crisis: Secondary | ICD-10-CM | POA: Diagnosis not present

## 2020-07-20 DIAGNOSIS — N186 End stage renal disease: Secondary | ICD-10-CM | POA: Diagnosis not present

## 2020-07-20 DIAGNOSIS — Z992 Dependence on renal dialysis: Secondary | ICD-10-CM | POA: Diagnosis not present

## 2020-07-21 DIAGNOSIS — E8779 Other fluid overload: Secondary | ICD-10-CM | POA: Diagnosis not present

## 2020-07-21 DIAGNOSIS — E8770 Fluid overload, unspecified: Secondary | ICD-10-CM | POA: Diagnosis not present

## 2020-07-21 DIAGNOSIS — D5720 Sickle-cell/Hb-C disease without crisis: Secondary | ICD-10-CM | POA: Diagnosis not present

## 2020-07-21 DIAGNOSIS — D571 Sickle-cell disease without crisis: Secondary | ICD-10-CM | POA: Diagnosis not present

## 2020-07-21 DIAGNOSIS — N186 End stage renal disease: Secondary | ICD-10-CM | POA: Diagnosis not present

## 2020-07-21 DIAGNOSIS — Z992 Dependence on renal dialysis: Secondary | ICD-10-CM | POA: Diagnosis not present

## 2020-07-21 DIAGNOSIS — N2581 Secondary hyperparathyroidism of renal origin: Secondary | ICD-10-CM | POA: Diagnosis not present

## 2020-07-22 ENCOUNTER — Ambulatory Visit (HOSPITAL_COMMUNITY)
Admission: RE | Admit: 2020-07-22 | Discharge: 2020-07-22 | Disposition: A | Discharge status: Home / Self Care | Payer: Medicare PPO | Source: Ambulatory Visit | Attending: Vascular Surgery | Admitting: Vascular Surgery

## 2020-07-22 ENCOUNTER — Ambulatory Visit (HOSPITAL_COMMUNITY): Payer: Medicare PPO

## 2020-07-22 ENCOUNTER — Encounter: Payer: Medicare PPO | Admitting: Vascular Surgery

## 2020-07-22 ENCOUNTER — Other Ambulatory Visit: Payer: Self-pay

## 2020-07-22 DIAGNOSIS — N186 End stage renal disease: Secondary | ICD-10-CM

## 2020-07-23 DIAGNOSIS — Z992 Dependence on renal dialysis: Secondary | ICD-10-CM | POA: Diagnosis not present

## 2020-07-23 DIAGNOSIS — E8779 Other fluid overload: Secondary | ICD-10-CM | POA: Diagnosis not present

## 2020-07-23 DIAGNOSIS — N186 End stage renal disease: Secondary | ICD-10-CM | POA: Diagnosis not present

## 2020-07-23 DIAGNOSIS — E8770 Fluid overload, unspecified: Secondary | ICD-10-CM | POA: Diagnosis not present

## 2020-07-23 DIAGNOSIS — N2581 Secondary hyperparathyroidism of renal origin: Secondary | ICD-10-CM | POA: Diagnosis not present

## 2020-07-23 DIAGNOSIS — D5720 Sickle-cell/Hb-C disease without crisis: Secondary | ICD-10-CM | POA: Diagnosis not present

## 2020-07-23 DIAGNOSIS — D571 Sickle-cell disease without crisis: Secondary | ICD-10-CM | POA: Diagnosis not present

## 2020-07-25 DIAGNOSIS — D5720 Sickle-cell/Hb-C disease without crisis: Secondary | ICD-10-CM | POA: Diagnosis not present

## 2020-07-25 DIAGNOSIS — E8770 Fluid overload, unspecified: Secondary | ICD-10-CM | POA: Diagnosis not present

## 2020-07-25 DIAGNOSIS — N186 End stage renal disease: Secondary | ICD-10-CM | POA: Diagnosis not present

## 2020-07-25 DIAGNOSIS — N2581 Secondary hyperparathyroidism of renal origin: Secondary | ICD-10-CM | POA: Diagnosis not present

## 2020-07-25 DIAGNOSIS — Z992 Dependence on renal dialysis: Secondary | ICD-10-CM | POA: Diagnosis not present

## 2020-07-25 DIAGNOSIS — D571 Sickle-cell disease without crisis: Secondary | ICD-10-CM | POA: Diagnosis not present

## 2020-07-25 DIAGNOSIS — E8779 Other fluid overload: Secondary | ICD-10-CM | POA: Diagnosis not present

## 2020-07-28 DIAGNOSIS — D571 Sickle-cell disease without crisis: Secondary | ICD-10-CM | POA: Diagnosis not present

## 2020-07-28 DIAGNOSIS — D5720 Sickle-cell/Hb-C disease without crisis: Secondary | ICD-10-CM | POA: Diagnosis not present

## 2020-07-28 DIAGNOSIS — N186 End stage renal disease: Secondary | ICD-10-CM | POA: Diagnosis not present

## 2020-07-28 DIAGNOSIS — Z992 Dependence on renal dialysis: Secondary | ICD-10-CM | POA: Diagnosis not present

## 2020-07-28 DIAGNOSIS — E8770 Fluid overload, unspecified: Secondary | ICD-10-CM | POA: Diagnosis not present

## 2020-07-28 DIAGNOSIS — N2581 Secondary hyperparathyroidism of renal origin: Secondary | ICD-10-CM | POA: Diagnosis not present

## 2020-07-28 DIAGNOSIS — E8779 Other fluid overload: Secondary | ICD-10-CM | POA: Diagnosis not present

## 2020-07-30 ENCOUNTER — Other Ambulatory Visit: Payer: Self-pay

## 2020-07-30 DIAGNOSIS — D5720 Sickle-cell/Hb-C disease without crisis: Secondary | ICD-10-CM | POA: Diagnosis not present

## 2020-07-30 DIAGNOSIS — Z992 Dependence on renal dialysis: Secondary | ICD-10-CM | POA: Diagnosis not present

## 2020-07-30 DIAGNOSIS — E8779 Other fluid overload: Secondary | ICD-10-CM | POA: Diagnosis not present

## 2020-07-30 DIAGNOSIS — N186 End stage renal disease: Secondary | ICD-10-CM | POA: Diagnosis not present

## 2020-07-30 DIAGNOSIS — D571 Sickle-cell disease without crisis: Secondary | ICD-10-CM | POA: Diagnosis not present

## 2020-07-30 DIAGNOSIS — N2581 Secondary hyperparathyroidism of renal origin: Secondary | ICD-10-CM | POA: Diagnosis not present

## 2020-07-30 DIAGNOSIS — E8770 Fluid overload, unspecified: Secondary | ICD-10-CM | POA: Diagnosis not present

## 2020-07-31 DIAGNOSIS — Z20822 Contact with and (suspected) exposure to covid-19: Secondary | ICD-10-CM | POA: Diagnosis not present

## 2020-08-01 DIAGNOSIS — N186 End stage renal disease: Secondary | ICD-10-CM | POA: Diagnosis not present

## 2020-08-01 DIAGNOSIS — N2581 Secondary hyperparathyroidism of renal origin: Secondary | ICD-10-CM | POA: Diagnosis not present

## 2020-08-01 DIAGNOSIS — D571 Sickle-cell disease without crisis: Secondary | ICD-10-CM | POA: Diagnosis not present

## 2020-08-01 DIAGNOSIS — D5720 Sickle-cell/Hb-C disease without crisis: Secondary | ICD-10-CM | POA: Diagnosis not present

## 2020-08-01 DIAGNOSIS — E8770 Fluid overload, unspecified: Secondary | ICD-10-CM | POA: Diagnosis not present

## 2020-08-01 DIAGNOSIS — Z992 Dependence on renal dialysis: Secondary | ICD-10-CM | POA: Diagnosis not present

## 2020-08-01 DIAGNOSIS — E8779 Other fluid overload: Secondary | ICD-10-CM | POA: Diagnosis not present

## 2020-08-04 DIAGNOSIS — N186 End stage renal disease: Secondary | ICD-10-CM | POA: Diagnosis not present

## 2020-08-04 DIAGNOSIS — N2581 Secondary hyperparathyroidism of renal origin: Secondary | ICD-10-CM | POA: Diagnosis not present

## 2020-08-04 DIAGNOSIS — D5720 Sickle-cell/Hb-C disease without crisis: Secondary | ICD-10-CM | POA: Diagnosis not present

## 2020-08-04 DIAGNOSIS — D571 Sickle-cell disease without crisis: Secondary | ICD-10-CM | POA: Diagnosis not present

## 2020-08-04 DIAGNOSIS — E8770 Fluid overload, unspecified: Secondary | ICD-10-CM | POA: Diagnosis not present

## 2020-08-04 DIAGNOSIS — Z992 Dependence on renal dialysis: Secondary | ICD-10-CM | POA: Diagnosis not present

## 2020-08-04 DIAGNOSIS — E8779 Other fluid overload: Secondary | ICD-10-CM | POA: Diagnosis not present

## 2020-08-06 DIAGNOSIS — D571 Sickle-cell disease without crisis: Secondary | ICD-10-CM | POA: Diagnosis not present

## 2020-08-06 DIAGNOSIS — E8779 Other fluid overload: Secondary | ICD-10-CM | POA: Diagnosis not present

## 2020-08-06 DIAGNOSIS — E8770 Fluid overload, unspecified: Secondary | ICD-10-CM | POA: Diagnosis not present

## 2020-08-06 DIAGNOSIS — N186 End stage renal disease: Secondary | ICD-10-CM | POA: Diagnosis not present

## 2020-08-06 DIAGNOSIS — D5720 Sickle-cell/Hb-C disease without crisis: Secondary | ICD-10-CM | POA: Diagnosis not present

## 2020-08-06 DIAGNOSIS — Z992 Dependence on renal dialysis: Secondary | ICD-10-CM | POA: Diagnosis not present

## 2020-08-06 DIAGNOSIS — N2581 Secondary hyperparathyroidism of renal origin: Secondary | ICD-10-CM | POA: Diagnosis not present

## 2020-08-08 DIAGNOSIS — E8779 Other fluid overload: Secondary | ICD-10-CM | POA: Diagnosis not present

## 2020-08-08 DIAGNOSIS — E441 Mild protein-calorie malnutrition: Secondary | ICD-10-CM | POA: Insufficient documentation

## 2020-08-08 DIAGNOSIS — N186 End stage renal disease: Secondary | ICD-10-CM | POA: Diagnosis not present

## 2020-08-08 DIAGNOSIS — D571 Sickle-cell disease without crisis: Secondary | ICD-10-CM | POA: Diagnosis not present

## 2020-08-08 DIAGNOSIS — E8770 Fluid overload, unspecified: Secondary | ICD-10-CM | POA: Diagnosis not present

## 2020-08-08 DIAGNOSIS — D5720 Sickle-cell/Hb-C disease without crisis: Secondary | ICD-10-CM | POA: Diagnosis not present

## 2020-08-08 DIAGNOSIS — Z992 Dependence on renal dialysis: Secondary | ICD-10-CM | POA: Diagnosis not present

## 2020-08-08 DIAGNOSIS — N2581 Secondary hyperparathyroidism of renal origin: Secondary | ICD-10-CM | POA: Diagnosis not present

## 2020-08-10 DIAGNOSIS — D571 Sickle-cell disease without crisis: Secondary | ICD-10-CM | POA: Diagnosis not present

## 2020-08-10 DIAGNOSIS — D5720 Sickle-cell/Hb-C disease without crisis: Secondary | ICD-10-CM | POA: Diagnosis not present

## 2020-08-10 DIAGNOSIS — E8770 Fluid overload, unspecified: Secondary | ICD-10-CM | POA: Diagnosis not present

## 2020-08-10 DIAGNOSIS — N186 End stage renal disease: Secondary | ICD-10-CM | POA: Diagnosis not present

## 2020-08-10 DIAGNOSIS — N2581 Secondary hyperparathyroidism of renal origin: Secondary | ICD-10-CM | POA: Diagnosis not present

## 2020-08-10 DIAGNOSIS — E8779 Other fluid overload: Secondary | ICD-10-CM | POA: Diagnosis not present

## 2020-08-10 DIAGNOSIS — Z992 Dependence on renal dialysis: Secondary | ICD-10-CM | POA: Diagnosis not present

## 2020-08-12 ENCOUNTER — Other Ambulatory Visit (HOSPITAL_COMMUNITY): Payer: Medicare PPO

## 2020-08-12 ENCOUNTER — Encounter (HOSPITAL_COMMUNITY): Payer: Medicare PPO

## 2020-08-12 ENCOUNTER — Encounter: Payer: Medicare PPO | Admitting: Vascular Surgery

## 2020-08-13 DIAGNOSIS — N2581 Secondary hyperparathyroidism of renal origin: Secondary | ICD-10-CM | POA: Diagnosis not present

## 2020-08-13 DIAGNOSIS — E8779 Other fluid overload: Secondary | ICD-10-CM | POA: Diagnosis not present

## 2020-08-13 DIAGNOSIS — D571 Sickle-cell disease without crisis: Secondary | ICD-10-CM | POA: Diagnosis not present

## 2020-08-13 DIAGNOSIS — Z992 Dependence on renal dialysis: Secondary | ICD-10-CM | POA: Diagnosis not present

## 2020-08-13 DIAGNOSIS — D5720 Sickle-cell/Hb-C disease without crisis: Secondary | ICD-10-CM | POA: Diagnosis not present

## 2020-08-13 DIAGNOSIS — N186 End stage renal disease: Secondary | ICD-10-CM | POA: Diagnosis not present

## 2020-08-13 DIAGNOSIS — E8770 Fluid overload, unspecified: Secondary | ICD-10-CM | POA: Diagnosis not present

## 2020-08-15 DIAGNOSIS — D571 Sickle-cell disease without crisis: Secondary | ICD-10-CM | POA: Diagnosis not present

## 2020-08-15 DIAGNOSIS — E8779 Other fluid overload: Secondary | ICD-10-CM | POA: Diagnosis not present

## 2020-08-15 DIAGNOSIS — N186 End stage renal disease: Secondary | ICD-10-CM | POA: Diagnosis not present

## 2020-08-15 DIAGNOSIS — Z992 Dependence on renal dialysis: Secondary | ICD-10-CM | POA: Diagnosis not present

## 2020-08-15 DIAGNOSIS — N2581 Secondary hyperparathyroidism of renal origin: Secondary | ICD-10-CM | POA: Diagnosis not present

## 2020-08-15 DIAGNOSIS — E8770 Fluid overload, unspecified: Secondary | ICD-10-CM | POA: Diagnosis not present

## 2020-08-15 DIAGNOSIS — D5720 Sickle-cell/Hb-C disease without crisis: Secondary | ICD-10-CM | POA: Diagnosis not present

## 2020-08-18 DIAGNOSIS — N2581 Secondary hyperparathyroidism of renal origin: Secondary | ICD-10-CM | POA: Diagnosis not present

## 2020-08-18 DIAGNOSIS — E8770 Fluid overload, unspecified: Secondary | ICD-10-CM | POA: Diagnosis not present

## 2020-08-18 DIAGNOSIS — E8779 Other fluid overload: Secondary | ICD-10-CM | POA: Diagnosis not present

## 2020-08-18 DIAGNOSIS — N186 End stage renal disease: Secondary | ICD-10-CM | POA: Diagnosis not present

## 2020-08-18 DIAGNOSIS — D571 Sickle-cell disease without crisis: Secondary | ICD-10-CM | POA: Diagnosis not present

## 2020-08-18 DIAGNOSIS — D5720 Sickle-cell/Hb-C disease without crisis: Secondary | ICD-10-CM | POA: Diagnosis not present

## 2020-08-18 DIAGNOSIS — Z992 Dependence on renal dialysis: Secondary | ICD-10-CM | POA: Diagnosis not present

## 2020-08-19 DIAGNOSIS — Z992 Dependence on renal dialysis: Secondary | ICD-10-CM | POA: Diagnosis not present

## 2020-08-19 DIAGNOSIS — E8779 Other fluid overload: Secondary | ICD-10-CM | POA: Diagnosis not present

## 2020-08-19 DIAGNOSIS — N186 End stage renal disease: Secondary | ICD-10-CM | POA: Diagnosis not present

## 2020-08-19 DIAGNOSIS — N2581 Secondary hyperparathyroidism of renal origin: Secondary | ICD-10-CM | POA: Diagnosis not present

## 2020-08-19 DIAGNOSIS — E8770 Fluid overload, unspecified: Secondary | ICD-10-CM | POA: Diagnosis not present

## 2020-08-19 DIAGNOSIS — D5720 Sickle-cell/Hb-C disease without crisis: Secondary | ICD-10-CM | POA: Diagnosis not present

## 2020-08-19 DIAGNOSIS — D571 Sickle-cell disease without crisis: Secondary | ICD-10-CM | POA: Diagnosis not present

## 2020-08-20 DIAGNOSIS — N186 End stage renal disease: Secondary | ICD-10-CM | POA: Diagnosis not present

## 2020-08-20 DIAGNOSIS — D5720 Sickle-cell/Hb-C disease without crisis: Secondary | ICD-10-CM | POA: Diagnosis not present

## 2020-08-20 DIAGNOSIS — Z992 Dependence on renal dialysis: Secondary | ICD-10-CM | POA: Diagnosis not present

## 2020-08-20 DIAGNOSIS — D571 Sickle-cell disease without crisis: Secondary | ICD-10-CM | POA: Diagnosis not present

## 2020-08-20 DIAGNOSIS — N2581 Secondary hyperparathyroidism of renal origin: Secondary | ICD-10-CM | POA: Diagnosis not present

## 2020-08-22 DIAGNOSIS — D571 Sickle-cell disease without crisis: Secondary | ICD-10-CM | POA: Diagnosis not present

## 2020-08-22 DIAGNOSIS — N186 End stage renal disease: Secondary | ICD-10-CM | POA: Diagnosis not present

## 2020-08-22 DIAGNOSIS — N2581 Secondary hyperparathyroidism of renal origin: Secondary | ICD-10-CM | POA: Diagnosis not present

## 2020-08-22 DIAGNOSIS — Z992 Dependence on renal dialysis: Secondary | ICD-10-CM | POA: Diagnosis not present

## 2020-08-22 DIAGNOSIS — D5720 Sickle-cell/Hb-C disease without crisis: Secondary | ICD-10-CM | POA: Diagnosis not present

## 2020-08-25 DIAGNOSIS — N186 End stage renal disease: Secondary | ICD-10-CM | POA: Diagnosis not present

## 2020-08-25 DIAGNOSIS — D5720 Sickle-cell/Hb-C disease without crisis: Secondary | ICD-10-CM | POA: Diagnosis not present

## 2020-08-25 DIAGNOSIS — N2581 Secondary hyperparathyroidism of renal origin: Secondary | ICD-10-CM | POA: Diagnosis not present

## 2020-08-25 DIAGNOSIS — Z992 Dependence on renal dialysis: Secondary | ICD-10-CM | POA: Diagnosis not present

## 2020-08-25 DIAGNOSIS — D571 Sickle-cell disease without crisis: Secondary | ICD-10-CM | POA: Diagnosis not present

## 2020-08-27 DIAGNOSIS — D5720 Sickle-cell/Hb-C disease without crisis: Secondary | ICD-10-CM | POA: Diagnosis not present

## 2020-08-27 DIAGNOSIS — D571 Sickle-cell disease without crisis: Secondary | ICD-10-CM | POA: Diagnosis not present

## 2020-08-27 DIAGNOSIS — N2581 Secondary hyperparathyroidism of renal origin: Secondary | ICD-10-CM | POA: Diagnosis not present

## 2020-08-27 DIAGNOSIS — N186 End stage renal disease: Secondary | ICD-10-CM | POA: Diagnosis not present

## 2020-08-27 DIAGNOSIS — Z992 Dependence on renal dialysis: Secondary | ICD-10-CM | POA: Diagnosis not present

## 2020-08-29 DIAGNOSIS — N186 End stage renal disease: Secondary | ICD-10-CM | POA: Diagnosis not present

## 2020-08-29 DIAGNOSIS — D5720 Sickle-cell/Hb-C disease without crisis: Secondary | ICD-10-CM | POA: Diagnosis not present

## 2020-08-29 DIAGNOSIS — D571 Sickle-cell disease without crisis: Secondary | ICD-10-CM | POA: Diagnosis not present

## 2020-08-29 DIAGNOSIS — Z992 Dependence on renal dialysis: Secondary | ICD-10-CM | POA: Diagnosis not present

## 2020-08-29 DIAGNOSIS — N2581 Secondary hyperparathyroidism of renal origin: Secondary | ICD-10-CM | POA: Diagnosis not present

## 2020-09-01 DIAGNOSIS — N2581 Secondary hyperparathyroidism of renal origin: Secondary | ICD-10-CM | POA: Diagnosis not present

## 2020-09-01 DIAGNOSIS — D571 Sickle-cell disease without crisis: Secondary | ICD-10-CM | POA: Diagnosis not present

## 2020-09-01 DIAGNOSIS — D5720 Sickle-cell/Hb-C disease without crisis: Secondary | ICD-10-CM | POA: Diagnosis not present

## 2020-09-01 DIAGNOSIS — Z992 Dependence on renal dialysis: Secondary | ICD-10-CM | POA: Diagnosis not present

## 2020-09-01 DIAGNOSIS — N186 End stage renal disease: Secondary | ICD-10-CM | POA: Diagnosis not present

## 2020-09-03 ENCOUNTER — Other Ambulatory Visit: Payer: Self-pay

## 2020-09-03 DIAGNOSIS — Z992 Dependence on renal dialysis: Secondary | ICD-10-CM | POA: Diagnosis not present

## 2020-09-03 DIAGNOSIS — N186 End stage renal disease: Secondary | ICD-10-CM | POA: Diagnosis not present

## 2020-09-03 DIAGNOSIS — D571 Sickle-cell disease without crisis: Secondary | ICD-10-CM | POA: Diagnosis not present

## 2020-09-03 DIAGNOSIS — D5720 Sickle-cell/Hb-C disease without crisis: Secondary | ICD-10-CM | POA: Diagnosis not present

## 2020-09-03 DIAGNOSIS — N2581 Secondary hyperparathyroidism of renal origin: Secondary | ICD-10-CM | POA: Diagnosis not present

## 2020-09-08 DIAGNOSIS — D571 Sickle-cell disease without crisis: Secondary | ICD-10-CM | POA: Diagnosis not present

## 2020-09-08 DIAGNOSIS — N2581 Secondary hyperparathyroidism of renal origin: Secondary | ICD-10-CM | POA: Diagnosis not present

## 2020-09-08 DIAGNOSIS — Z992 Dependence on renal dialysis: Secondary | ICD-10-CM | POA: Diagnosis not present

## 2020-09-08 DIAGNOSIS — D5720 Sickle-cell/Hb-C disease without crisis: Secondary | ICD-10-CM | POA: Diagnosis not present

## 2020-09-08 DIAGNOSIS — E119 Type 2 diabetes mellitus without complications: Secondary | ICD-10-CM | POA: Diagnosis not present

## 2020-09-08 DIAGNOSIS — N186 End stage renal disease: Secondary | ICD-10-CM | POA: Diagnosis not present

## 2020-09-10 DIAGNOSIS — N2581 Secondary hyperparathyroidism of renal origin: Secondary | ICD-10-CM | POA: Diagnosis not present

## 2020-09-10 DIAGNOSIS — D571 Sickle-cell disease without crisis: Secondary | ICD-10-CM | POA: Diagnosis not present

## 2020-09-10 DIAGNOSIS — D5720 Sickle-cell/Hb-C disease without crisis: Secondary | ICD-10-CM | POA: Diagnosis not present

## 2020-09-10 DIAGNOSIS — Z992 Dependence on renal dialysis: Secondary | ICD-10-CM | POA: Diagnosis not present

## 2020-09-10 DIAGNOSIS — N186 End stage renal disease: Secondary | ICD-10-CM | POA: Diagnosis not present

## 2020-09-15 DIAGNOSIS — N186 End stage renal disease: Secondary | ICD-10-CM | POA: Diagnosis not present

## 2020-09-15 DIAGNOSIS — D571 Sickle-cell disease without crisis: Secondary | ICD-10-CM | POA: Diagnosis not present

## 2020-09-15 DIAGNOSIS — N2581 Secondary hyperparathyroidism of renal origin: Secondary | ICD-10-CM | POA: Diagnosis not present

## 2020-09-15 DIAGNOSIS — D5720 Sickle-cell/Hb-C disease without crisis: Secondary | ICD-10-CM | POA: Diagnosis not present

## 2020-09-15 DIAGNOSIS — Z992 Dependence on renal dialysis: Secondary | ICD-10-CM | POA: Diagnosis not present

## 2020-09-16 ENCOUNTER — Other Ambulatory Visit (HOSPITAL_COMMUNITY): Payer: Medicare PPO

## 2020-09-16 ENCOUNTER — Encounter: Payer: Medicare PPO | Admitting: Vascular Surgery

## 2020-09-16 ENCOUNTER — Encounter (HOSPITAL_COMMUNITY): Payer: Medicare PPO

## 2020-09-17 DIAGNOSIS — N186 End stage renal disease: Secondary | ICD-10-CM | POA: Diagnosis not present

## 2020-09-17 DIAGNOSIS — N2581 Secondary hyperparathyroidism of renal origin: Secondary | ICD-10-CM | POA: Diagnosis not present

## 2020-09-17 DIAGNOSIS — Z992 Dependence on renal dialysis: Secondary | ICD-10-CM | POA: Diagnosis not present

## 2020-09-17 DIAGNOSIS — D5720 Sickle-cell/Hb-C disease without crisis: Secondary | ICD-10-CM | POA: Diagnosis not present

## 2020-09-17 DIAGNOSIS — D571 Sickle-cell disease without crisis: Secondary | ICD-10-CM | POA: Diagnosis not present

## 2020-09-19 DIAGNOSIS — D571 Sickle-cell disease without crisis: Secondary | ICD-10-CM | POA: Diagnosis not present

## 2020-09-19 DIAGNOSIS — N186 End stage renal disease: Secondary | ICD-10-CM | POA: Diagnosis not present

## 2020-09-19 DIAGNOSIS — N2581 Secondary hyperparathyroidism of renal origin: Secondary | ICD-10-CM | POA: Diagnosis not present

## 2020-09-19 DIAGNOSIS — Z992 Dependence on renal dialysis: Secondary | ICD-10-CM | POA: Diagnosis not present

## 2020-09-19 DIAGNOSIS — D5720 Sickle-cell/Hb-C disease without crisis: Secondary | ICD-10-CM | POA: Diagnosis not present

## 2020-09-24 DIAGNOSIS — N2581 Secondary hyperparathyroidism of renal origin: Secondary | ICD-10-CM | POA: Diagnosis not present

## 2020-09-24 DIAGNOSIS — D5720 Sickle-cell/Hb-C disease without crisis: Secondary | ICD-10-CM | POA: Diagnosis not present

## 2020-09-24 DIAGNOSIS — D571 Sickle-cell disease without crisis: Secondary | ICD-10-CM | POA: Diagnosis not present

## 2020-09-24 DIAGNOSIS — N186 End stage renal disease: Secondary | ICD-10-CM | POA: Diagnosis not present

## 2020-09-24 DIAGNOSIS — Z992 Dependence on renal dialysis: Secondary | ICD-10-CM | POA: Diagnosis not present

## 2020-09-26 DIAGNOSIS — Z992 Dependence on renal dialysis: Secondary | ICD-10-CM | POA: Diagnosis not present

## 2020-09-26 DIAGNOSIS — D5720 Sickle-cell/Hb-C disease without crisis: Secondary | ICD-10-CM | POA: Diagnosis not present

## 2020-09-26 DIAGNOSIS — D571 Sickle-cell disease without crisis: Secondary | ICD-10-CM | POA: Diagnosis not present

## 2020-09-26 DIAGNOSIS — N2581 Secondary hyperparathyroidism of renal origin: Secondary | ICD-10-CM | POA: Diagnosis not present

## 2020-09-26 DIAGNOSIS — N186 End stage renal disease: Secondary | ICD-10-CM | POA: Diagnosis not present

## 2020-09-29 DIAGNOSIS — Z992 Dependence on renal dialysis: Secondary | ICD-10-CM | POA: Diagnosis not present

## 2020-09-29 DIAGNOSIS — N2581 Secondary hyperparathyroidism of renal origin: Secondary | ICD-10-CM | POA: Diagnosis not present

## 2020-09-29 DIAGNOSIS — D571 Sickle-cell disease without crisis: Secondary | ICD-10-CM | POA: Diagnosis not present

## 2020-09-29 DIAGNOSIS — N186 End stage renal disease: Secondary | ICD-10-CM | POA: Diagnosis not present

## 2020-09-29 DIAGNOSIS — D5720 Sickle-cell/Hb-C disease without crisis: Secondary | ICD-10-CM | POA: Diagnosis not present

## 2020-10-01 DIAGNOSIS — N186 End stage renal disease: Secondary | ICD-10-CM | POA: Diagnosis not present

## 2020-10-01 DIAGNOSIS — D5720 Sickle-cell/Hb-C disease without crisis: Secondary | ICD-10-CM | POA: Diagnosis not present

## 2020-10-01 DIAGNOSIS — Z992 Dependence on renal dialysis: Secondary | ICD-10-CM | POA: Diagnosis not present

## 2020-10-01 DIAGNOSIS — N2581 Secondary hyperparathyroidism of renal origin: Secondary | ICD-10-CM | POA: Diagnosis not present

## 2020-10-01 DIAGNOSIS — D571 Sickle-cell disease without crisis: Secondary | ICD-10-CM | POA: Diagnosis not present

## 2020-10-03 DIAGNOSIS — Z992 Dependence on renal dialysis: Secondary | ICD-10-CM | POA: Diagnosis not present

## 2020-10-03 DIAGNOSIS — N186 End stage renal disease: Secondary | ICD-10-CM | POA: Diagnosis not present

## 2020-10-03 DIAGNOSIS — D5720 Sickle-cell/Hb-C disease without crisis: Secondary | ICD-10-CM | POA: Diagnosis not present

## 2020-10-03 DIAGNOSIS — N2581 Secondary hyperparathyroidism of renal origin: Secondary | ICD-10-CM | POA: Diagnosis not present

## 2020-10-03 DIAGNOSIS — D571 Sickle-cell disease without crisis: Secondary | ICD-10-CM | POA: Diagnosis not present

## 2020-10-08 DIAGNOSIS — D571 Sickle-cell disease without crisis: Secondary | ICD-10-CM | POA: Diagnosis not present

## 2020-10-08 DIAGNOSIS — Z7682 Awaiting organ transplant status: Secondary | ICD-10-CM | POA: Diagnosis not present

## 2020-10-08 DIAGNOSIS — N186 End stage renal disease: Secondary | ICD-10-CM | POA: Diagnosis not present

## 2020-10-08 DIAGNOSIS — D5720 Sickle-cell/Hb-C disease without crisis: Secondary | ICD-10-CM | POA: Diagnosis not present

## 2020-10-08 DIAGNOSIS — Z992 Dependence on renal dialysis: Secondary | ICD-10-CM | POA: Diagnosis not present

## 2020-10-08 DIAGNOSIS — N2581 Secondary hyperparathyroidism of renal origin: Secondary | ICD-10-CM | POA: Diagnosis not present

## 2020-10-10 DIAGNOSIS — D5720 Sickle-cell/Hb-C disease without crisis: Secondary | ICD-10-CM | POA: Diagnosis not present

## 2020-10-10 DIAGNOSIS — Z992 Dependence on renal dialysis: Secondary | ICD-10-CM | POA: Diagnosis not present

## 2020-10-10 DIAGNOSIS — N2581 Secondary hyperparathyroidism of renal origin: Secondary | ICD-10-CM | POA: Diagnosis not present

## 2020-10-10 DIAGNOSIS — D571 Sickle-cell disease without crisis: Secondary | ICD-10-CM | POA: Diagnosis not present

## 2020-10-10 DIAGNOSIS — N186 End stage renal disease: Secondary | ICD-10-CM | POA: Diagnosis not present

## 2020-10-13 DIAGNOSIS — D5720 Sickle-cell/Hb-C disease without crisis: Secondary | ICD-10-CM | POA: Diagnosis not present

## 2020-10-13 DIAGNOSIS — N186 End stage renal disease: Secondary | ICD-10-CM | POA: Diagnosis not present

## 2020-10-13 DIAGNOSIS — Z992 Dependence on renal dialysis: Secondary | ICD-10-CM | POA: Diagnosis not present

## 2020-10-13 DIAGNOSIS — D571 Sickle-cell disease without crisis: Secondary | ICD-10-CM | POA: Diagnosis not present

## 2020-10-13 DIAGNOSIS — N2581 Secondary hyperparathyroidism of renal origin: Secondary | ICD-10-CM | POA: Diagnosis not present

## 2020-10-15 DIAGNOSIS — N186 End stage renal disease: Secondary | ICD-10-CM | POA: Diagnosis not present

## 2020-10-15 DIAGNOSIS — D571 Sickle-cell disease without crisis: Secondary | ICD-10-CM | POA: Diagnosis not present

## 2020-10-15 DIAGNOSIS — N2581 Secondary hyperparathyroidism of renal origin: Secondary | ICD-10-CM | POA: Diagnosis not present

## 2020-10-15 DIAGNOSIS — Z992 Dependence on renal dialysis: Secondary | ICD-10-CM | POA: Diagnosis not present

## 2020-10-15 DIAGNOSIS — D5720 Sickle-cell/Hb-C disease without crisis: Secondary | ICD-10-CM | POA: Diagnosis not present

## 2020-10-17 DIAGNOSIS — D5720 Sickle-cell/Hb-C disease without crisis: Secondary | ICD-10-CM | POA: Diagnosis not present

## 2020-10-17 DIAGNOSIS — N186 End stage renal disease: Secondary | ICD-10-CM | POA: Diagnosis not present

## 2020-10-17 DIAGNOSIS — D571 Sickle-cell disease without crisis: Secondary | ICD-10-CM | POA: Diagnosis not present

## 2020-10-17 DIAGNOSIS — Z992 Dependence on renal dialysis: Secondary | ICD-10-CM | POA: Diagnosis not present

## 2020-10-17 DIAGNOSIS — N2581 Secondary hyperparathyroidism of renal origin: Secondary | ICD-10-CM | POA: Diagnosis not present

## 2020-10-20 DIAGNOSIS — D5720 Sickle-cell/Hb-C disease without crisis: Secondary | ICD-10-CM | POA: Diagnosis not present

## 2020-10-20 DIAGNOSIS — N2581 Secondary hyperparathyroidism of renal origin: Secondary | ICD-10-CM | POA: Diagnosis not present

## 2020-10-20 DIAGNOSIS — N186 End stage renal disease: Secondary | ICD-10-CM | POA: Diagnosis not present

## 2020-10-20 DIAGNOSIS — Z992 Dependence on renal dialysis: Secondary | ICD-10-CM | POA: Diagnosis not present

## 2020-10-20 DIAGNOSIS — D571 Sickle-cell disease without crisis: Secondary | ICD-10-CM | POA: Diagnosis not present

## 2020-10-22 DIAGNOSIS — N186 End stage renal disease: Secondary | ICD-10-CM | POA: Diagnosis not present

## 2020-10-22 DIAGNOSIS — D5720 Sickle-cell/Hb-C disease without crisis: Secondary | ICD-10-CM | POA: Diagnosis not present

## 2020-10-22 DIAGNOSIS — D571 Sickle-cell disease without crisis: Secondary | ICD-10-CM | POA: Diagnosis not present

## 2020-10-22 DIAGNOSIS — N2581 Secondary hyperparathyroidism of renal origin: Secondary | ICD-10-CM | POA: Diagnosis not present

## 2020-10-22 DIAGNOSIS — Z992 Dependence on renal dialysis: Secondary | ICD-10-CM | POA: Diagnosis not present

## 2020-10-23 DIAGNOSIS — Z23 Encounter for immunization: Secondary | ICD-10-CM | POA: Diagnosis not present

## 2020-10-23 DIAGNOSIS — N186 End stage renal disease: Secondary | ICD-10-CM | POA: Diagnosis not present

## 2020-10-23 DIAGNOSIS — E1122 Type 2 diabetes mellitus with diabetic chronic kidney disease: Secondary | ICD-10-CM | POA: Diagnosis not present

## 2020-10-23 DIAGNOSIS — D571 Sickle-cell disease without crisis: Secondary | ICD-10-CM | POA: Diagnosis not present

## 2020-10-23 DIAGNOSIS — Z992 Dependence on renal dialysis: Secondary | ICD-10-CM | POA: Diagnosis not present

## 2020-10-23 DIAGNOSIS — I152 Hypertension secondary to endocrine disorders: Secondary | ICD-10-CM | POA: Diagnosis not present

## 2020-10-23 DIAGNOSIS — E1159 Type 2 diabetes mellitus with other circulatory complications: Secondary | ICD-10-CM | POA: Diagnosis not present

## 2020-10-23 DIAGNOSIS — I10 Essential (primary) hypertension: Secondary | ICD-10-CM | POA: Diagnosis not present

## 2020-10-24 DIAGNOSIS — N2581 Secondary hyperparathyroidism of renal origin: Secondary | ICD-10-CM | POA: Diagnosis not present

## 2020-10-24 DIAGNOSIS — Z992 Dependence on renal dialysis: Secondary | ICD-10-CM | POA: Diagnosis not present

## 2020-10-24 DIAGNOSIS — D5720 Sickle-cell/Hb-C disease without crisis: Secondary | ICD-10-CM | POA: Diagnosis not present

## 2020-10-24 DIAGNOSIS — N186 End stage renal disease: Secondary | ICD-10-CM | POA: Diagnosis not present

## 2020-10-24 DIAGNOSIS — D571 Sickle-cell disease without crisis: Secondary | ICD-10-CM | POA: Diagnosis not present

## 2020-10-27 ENCOUNTER — Other Ambulatory Visit: Payer: Self-pay | Admitting: Vascular Surgery

## 2020-10-27 DIAGNOSIS — Z992 Dependence on renal dialysis: Secondary | ICD-10-CM | POA: Diagnosis not present

## 2020-10-27 DIAGNOSIS — D571 Sickle-cell disease without crisis: Secondary | ICD-10-CM | POA: Diagnosis not present

## 2020-10-27 DIAGNOSIS — N186 End stage renal disease: Secondary | ICD-10-CM

## 2020-10-27 DIAGNOSIS — N2581 Secondary hyperparathyroidism of renal origin: Secondary | ICD-10-CM | POA: Diagnosis not present

## 2020-10-27 DIAGNOSIS — D5720 Sickle-cell/Hb-C disease without crisis: Secondary | ICD-10-CM | POA: Diagnosis not present

## 2020-10-29 DIAGNOSIS — N2581 Secondary hyperparathyroidism of renal origin: Secondary | ICD-10-CM | POA: Diagnosis not present

## 2020-10-29 DIAGNOSIS — Z992 Dependence on renal dialysis: Secondary | ICD-10-CM | POA: Diagnosis not present

## 2020-10-29 DIAGNOSIS — N186 End stage renal disease: Secondary | ICD-10-CM | POA: Diagnosis not present

## 2020-10-29 DIAGNOSIS — D5720 Sickle-cell/Hb-C disease without crisis: Secondary | ICD-10-CM | POA: Diagnosis not present

## 2020-10-29 DIAGNOSIS — D571 Sickle-cell disease without crisis: Secondary | ICD-10-CM | POA: Diagnosis not present

## 2020-10-31 DIAGNOSIS — N2581 Secondary hyperparathyroidism of renal origin: Secondary | ICD-10-CM | POA: Diagnosis not present

## 2020-10-31 DIAGNOSIS — D5720 Sickle-cell/Hb-C disease without crisis: Secondary | ICD-10-CM | POA: Diagnosis not present

## 2020-10-31 DIAGNOSIS — D571 Sickle-cell disease without crisis: Secondary | ICD-10-CM | POA: Diagnosis not present

## 2020-10-31 DIAGNOSIS — N186 End stage renal disease: Secondary | ICD-10-CM | POA: Diagnosis not present

## 2020-10-31 DIAGNOSIS — Z992 Dependence on renal dialysis: Secondary | ICD-10-CM | POA: Diagnosis not present

## 2020-11-03 DIAGNOSIS — D571 Sickle-cell disease without crisis: Secondary | ICD-10-CM | POA: Diagnosis not present

## 2020-11-03 DIAGNOSIS — D5720 Sickle-cell/Hb-C disease without crisis: Secondary | ICD-10-CM | POA: Diagnosis not present

## 2020-11-03 DIAGNOSIS — N2581 Secondary hyperparathyroidism of renal origin: Secondary | ICD-10-CM | POA: Diagnosis not present

## 2020-11-03 DIAGNOSIS — N186 End stage renal disease: Secondary | ICD-10-CM | POA: Diagnosis not present

## 2020-11-03 DIAGNOSIS — Z992 Dependence on renal dialysis: Secondary | ICD-10-CM | POA: Diagnosis not present

## 2020-11-05 DIAGNOSIS — N2581 Secondary hyperparathyroidism of renal origin: Secondary | ICD-10-CM | POA: Diagnosis not present

## 2020-11-05 DIAGNOSIS — Z992 Dependence on renal dialysis: Secondary | ICD-10-CM | POA: Diagnosis not present

## 2020-11-05 DIAGNOSIS — N186 End stage renal disease: Secondary | ICD-10-CM | POA: Diagnosis not present

## 2020-11-05 DIAGNOSIS — D5720 Sickle-cell/Hb-C disease without crisis: Secondary | ICD-10-CM | POA: Diagnosis not present

## 2020-11-05 DIAGNOSIS — D571 Sickle-cell disease without crisis: Secondary | ICD-10-CM | POA: Diagnosis not present

## 2020-11-07 DIAGNOSIS — D5720 Sickle-cell/Hb-C disease without crisis: Secondary | ICD-10-CM | POA: Diagnosis not present

## 2020-11-07 DIAGNOSIS — Z992 Dependence on renal dialysis: Secondary | ICD-10-CM | POA: Diagnosis not present

## 2020-11-07 DIAGNOSIS — N2581 Secondary hyperparathyroidism of renal origin: Secondary | ICD-10-CM | POA: Diagnosis not present

## 2020-11-07 DIAGNOSIS — D571 Sickle-cell disease without crisis: Secondary | ICD-10-CM | POA: Diagnosis not present

## 2020-11-07 DIAGNOSIS — N186 End stage renal disease: Secondary | ICD-10-CM | POA: Diagnosis not present

## 2020-11-10 DIAGNOSIS — Z992 Dependence on renal dialysis: Secondary | ICD-10-CM | POA: Diagnosis not present

## 2020-11-10 DIAGNOSIS — D5720 Sickle-cell/Hb-C disease without crisis: Secondary | ICD-10-CM | POA: Diagnosis not present

## 2020-11-10 DIAGNOSIS — D571 Sickle-cell disease without crisis: Secondary | ICD-10-CM | POA: Diagnosis not present

## 2020-11-10 DIAGNOSIS — N2581 Secondary hyperparathyroidism of renal origin: Secondary | ICD-10-CM | POA: Diagnosis not present

## 2020-11-10 DIAGNOSIS — N186 End stage renal disease: Secondary | ICD-10-CM | POA: Diagnosis not present

## 2020-11-12 DIAGNOSIS — Z992 Dependence on renal dialysis: Secondary | ICD-10-CM | POA: Diagnosis not present

## 2020-11-12 DIAGNOSIS — N2581 Secondary hyperparathyroidism of renal origin: Secondary | ICD-10-CM | POA: Diagnosis not present

## 2020-11-12 DIAGNOSIS — D571 Sickle-cell disease without crisis: Secondary | ICD-10-CM | POA: Diagnosis not present

## 2020-11-12 DIAGNOSIS — D5720 Sickle-cell/Hb-C disease without crisis: Secondary | ICD-10-CM | POA: Diagnosis not present

## 2020-11-12 DIAGNOSIS — N186 End stage renal disease: Secondary | ICD-10-CM | POA: Diagnosis not present

## 2020-11-17 DIAGNOSIS — Z992 Dependence on renal dialysis: Secondary | ICD-10-CM | POA: Diagnosis not present

## 2020-11-17 DIAGNOSIS — N2581 Secondary hyperparathyroidism of renal origin: Secondary | ICD-10-CM | POA: Diagnosis not present

## 2020-11-17 DIAGNOSIS — D571 Sickle-cell disease without crisis: Secondary | ICD-10-CM | POA: Diagnosis not present

## 2020-11-17 DIAGNOSIS — N186 End stage renal disease: Secondary | ICD-10-CM | POA: Diagnosis not present

## 2020-11-17 DIAGNOSIS — D5720 Sickle-cell/Hb-C disease without crisis: Secondary | ICD-10-CM | POA: Diagnosis not present

## 2020-11-18 ENCOUNTER — Encounter: Payer: Self-pay | Admitting: Vascular Surgery

## 2020-11-18 ENCOUNTER — Other Ambulatory Visit: Payer: Self-pay

## 2020-11-18 ENCOUNTER — Ambulatory Visit (INDEPENDENT_AMBULATORY_CARE_PROVIDER_SITE_OTHER): Payer: Medicare PPO | Admitting: Vascular Surgery

## 2020-11-18 ENCOUNTER — Ambulatory Visit (INDEPENDENT_AMBULATORY_CARE_PROVIDER_SITE_OTHER)
Admission: RE | Admit: 2020-11-18 | Discharge: 2020-11-18 | Disposition: A | Discharge status: Home / Self Care | Payer: Medicare PPO | Source: Ambulatory Visit | Attending: Vascular Surgery | Admitting: Vascular Surgery

## 2020-11-18 ENCOUNTER — Ambulatory Visit (HOSPITAL_COMMUNITY)
Admission: RE | Admit: 2020-11-18 | Discharge: 2020-11-18 | Disposition: A | Discharge status: Home / Self Care | Payer: Medicare PPO | Source: Ambulatory Visit | Attending: Vascular Surgery | Admitting: Vascular Surgery

## 2020-11-18 VITALS — BP 142/70 | HR 69 | Temp 98.1°F | Resp 20 | Ht 62.0 in | Wt 151.0 lb

## 2020-11-18 DIAGNOSIS — Z992 Dependence on renal dialysis: Secondary | ICD-10-CM | POA: Diagnosis not present

## 2020-11-18 DIAGNOSIS — N186 End stage renal disease: Secondary | ICD-10-CM

## 2020-11-18 NOTE — H&P (View-Only) (Signed)
ASSESSMENT & PLAN:  Julie Wells is a 50 y.o. right handed female in need of permanent hemodialysis access. I reviewed options for dialysis in detail with the patient. I counseled the patient that dialysis access requires surveillance and periodic maintenance. Plan to proceed with left brachiocephalic arteriovenous fistula 11/27/20.   CHIEF COMPLAINT:   In need of HD access  HISTORY:  HISTORY OF PRESENT ILLNESS: Julie Wells is a 50 y.o. female with a new diagnosis of end-stage renal disease.  She is right-handed.  She is currently dialyzing through a right internal jugular tunneled dialysis catheter.  She has no other history of central venous catheter.  She is never had a pacemaker.  She has never had access surgery before.  Past Medical History:  Diagnosis Date  . Frequent headaches   . Hematuria 06/20/2016  . High cholesterol   . Hypertension   . Migraine    " I constantly have my migraines" (06/23/2016)  . Pneumonia   . Short-term memory loss    Archie Endo 06/23/2016  . Sickle cell trait ([place])   . Stroke [facility]) 2010   short term memory loss/notes 06/23/2016  . Type 2 diabetes, diet controlled ([place])     Past Surgical History:  Procedure Laterality Date  . CESAREAN SECTION  X 2  . TUBAL LIGATION      No family history on file.  Social History   Socioeconomic History  . Marital status: Married    Spouse name: Not on file  . Number of children: Not on file  . Years of education: Not on file  . Highest education level: Not on file  Occupational History  . Not on file  Tobacco Use  . Smoking status: Never Smoker  . Smokeless tobacco: Never Used  Substance and Sexual Activity  . Alcohol use: No  . Drug use: No  . Sexual activity: Not Currently  Other Topics Concern  . Not on file  Social History Narrative   ** Merged History Encounter **       Social Determinants of Health   Financial Resource Strain: Not on file  Food Insecurity: Not on file  Transportation  Needs: Not on file  Physical Activity: Not on file  Stress: Not on file  Social Connections: Not on file  Intimate Partner Violence: Not on file    Allergies  Allergen Reactions  . Levaquin [Levofloxacin In D5w] Hives and Swelling  . Other Swelling    Unnamed med caused swelling of the lips (patient cannot recall the indication or name)    Current Outpatient Medications  Medication Sig Dispense Refill  . acetaminophen (TYLENOL) 500 MG tablet Take 500-1,000 mg by mouth every 6 (six) hours as needed for mild pain or headache.     Marland Kitchen amLODipine-benazepril (LOTREL) 10-40 MG per capsule Take 1 capsule by mouth daily.    . hydrochlorothiazide (HYDRODIURIL) 25 MG tablet Take 25 mg by mouth daily.    . iron polysaccharides (NIFEREX) 150 MG capsule Take 150 mg by mouth daily.    Marland Kitchen labetalol (NORMODYNE) 100 MG tablet Take 100 mg by mouth 2 (two) times daily.    . Multiple Vitamins-Minerals (ONE-A-DAY WOMENS 50+ ADVANTAGE) TABS Take 1 tablet by mouth daily.    . pravastatin (PRAVACHOL) 20 MG tablet Take 20 mg by mouth at bedtime.     . topiramate (TOPAMAX) 50 MG tablet Take 50 mg by mouth 2 (two) times daily.     No current facility-administered medications  for this visit.    REVIEW OF SYSTEMS:  [X]  denotes positive finding, [ ]  denotes negative finding Cardiac  Comments:  Chest pain or chest pressure:    Shortness of breath upon exertion:    Short of breath when lying flat:    Irregular heart rhythm:        Vascular    Pain in calf, thigh, or hip brought on by ambulation:    Pain in feet at night that wakes you up from your sleep:     Blood clot in your veins:    Leg swelling:         Pulmonary    Oxygen at home:    Productive cough:     Wheezing:         Neurologic    Sudden weakness in arms or legs:     Sudden numbness in arms or legs:     Sudden onset of difficulty speaking or slurred speech:    Temporary loss of vision in one eye:     Problems with dizziness:          Gastrointestinal    Blood in stool:     Vomited blood:         Genitourinary    Burning when urinating:     Blood in urine:        Psychiatric    Major depression:         Hematologic    Bleeding problems:    Problems with blood clotting too easily:        Skin    Rashes or ulcers:        Constitutional    Fever or chills:     PHYSICAL EXAM:   Vitals:   11/18/20 1318  BP: (!) 142/70  Pulse: 69  Resp: 20  Temp: 98.1 F (36.7 C)  SpO2: 96%  Weight: 151 lb (68.5 kg)  Height: 5\' 2"  (1.575 m)   Constitutional: well appearing in no distress. Appears well nourished.  Neurologic: CN intact. no focal findings. no sensory loss. Psychiatric: Mood and affect symmetric and appropriate. Eyes: No icterus. No conjunctival pallor. Ears, nose, throat: mucous membranes moist. Midline trachea.  Cardiac: regular rate and rhythm.  Respiratory: unlabored. Abdominal: soft, non-tender, non-distended.  Peripheral vascular:  2+ radial pulses bilaterally Extremity: No edema. No cyanosis. No pallor.  Skin: No gangrene. No ulceration.  Lymphatic: No Stemmer's sign. No palpable lymphadenopathy.   DATA REVIEW:    Most recent CBC CBC Latest Ref Rng & Units 06/24/2016 06/23/2016 06/22/2016  WBC 4.0 - 10.5 K/uL 14.0(H) 15.7(H) 15.6(H)  Hemoglobin 12.0 - 15.0 g/dL 9.2(L) 9.4(L) 8.9(L)  Hematocrit 36.0 - 46.0 % 27.6(L) 28.2(L) 26.3(L)  Platelets 150 - 400 K/uL 780(H) 835(H) 843(H)     Most recent CMP CMP Latest Ref Rng & Units 06/24/2016 06/23/2016 06/22/2016  Glucose 65 - 99 mg/dL 76 77 -  BUN 6 - 20 mg/dL 39(H) 44(H) -  Creatinine 0.44 - 1.00 mg/dL 3.14(H) 3.30(H) -  Sodium 135 - 145 mmol/L 142 144 -  Potassium 3.5 - 5.1 mmol/L 3.3(L) 3.6 3.8  Chloride 101 - 111 mmol/L 108 116(H) -  CO2 22 - 32 mmol/L 18(L) 16(L) -  Calcium 8.9 - 10.3 mg/dL 9.2 9.3 -  Total Protein 6.5 - 8.1 g/dL - - -  Total Bilirubin 0.3 - 1.2 mg/dL - - -  Alkaline Phos 38 - 126 U/L - - -  AST 15 - 41 U/L - - -  ALT 14 - 54 U/L - - -    Renal function CrCl cannot be calculated (Patient's most recent lab result is older than the maximum 21 days allowed.).  Hgb A1c MFr Bld (%)  Date Value  06/20/2016 4.5 (L)    No results found for: LDLCALC, LDLC, HIRISKLDL, POCLDL, LDLDIRECT, REALLDLC, TOTLDLC   Vascular Imaging: Upper extremity vein mapping   Yevonne Aline. Stanford Breed, MD [facility] Phone Number: [phone] 11/18/2020 12:48 PM

## 2020-11-18 NOTE — Progress Notes (Signed)
ASSESSMENT & PLAN:  Julie Wells is a 50 y.o. right handed female in need of permanent hemodialysis access. I reviewed options for dialysis in detail with the patient. I counseled the patient that dialysis access requires surveillance and periodic maintenance. Plan to proceed with left brachiocephalic arteriovenous fistula 11/27/20.   CHIEF COMPLAINT:   In need of HD access  HISTORY:  HISTORY OF PRESENT ILLNESS: Julie Wells is a 49 y.o. female with a new diagnosis of end-stage renal disease.  She is right-handed.  She is currently dialyzing through a right internal jugular tunneled dialysis catheter.  She has no other history of central venous catheter.  She is never had a pacemaker.  She has never had access surgery before.  Past Medical History:  Diagnosis Date   Frequent headaches    Hematuria 06/20/2016   High cholesterol    Hypertension    Migraine    " I constantly have my migraines" (06/23/2016)   Pneumonia    Short-term memory loss    /notes 06/23/2016   Sickle cell trait ([place])    Stroke ([place]) 2010   short term memory loss/notes 06/23/2016   Type 2 diabetes, diet controlled ([place])     Past Surgical History:  Procedure Laterality Date   CESAREAN SECTION  X 2   TUBAL LIGATION      No family history on file.  Social History   Socioeconomic History   Marital status: Married    Spouse name: Not on file   Number of children: Not on file   Years of education: Not on file   Highest education level: Not on file  Occupational History   Not on file  Tobacco Use   Smoking status: Never Smoker   Smokeless tobacco: Never Used  Substance and Sexual Activity   Alcohol use: No   Drug use: No   Sexual activity: Not Currently  Other Topics Concern   Not on file  Social History Narrative   ** Merged History Encounter **       Social Determinants of Health   Financial Resource Strain: Not on file  Food Insecurity: Not on file  Transportation  Needs: Not on file  Physical Activity: Not on file  Stress: Not on file  Social Connections: Not on file  Intimate Partner Violence: Not on file    Allergies  Allergen Reactions   Levaquin [Levofloxacin In D5w] Hives and Swelling   Other Swelling    Unnamed med caused swelling of the lips (patient cannot recall the indication or name)    Current Outpatient Medications  Medication Sig Dispense Refill   acetaminophen (TYLENOL) 500 MG tablet Take 500-1,000 mg by mouth every 6 (six) hours as needed for mild pain or headache.      amLODipine-benazepril (LOTREL) 10-40 MG per capsule Take 1 capsule by mouth daily.     hydrochlorothiazide (HYDRODIURIL) 25 MG tablet Take 25 mg by mouth daily.     iron polysaccharides (NIFEREX) 150 MG capsule Take 150 mg by mouth daily.     labetalol (NORMODYNE) 100 MG tablet Take 100 mg by mouth 2 (two) times daily.     Multiple Vitamins-Minerals (ONE-A-DAY WOMENS 50+ ADVANTAGE) TABS Take 1 tablet by mouth daily.     pravastatin (PRAVACHOL) 20 MG tablet Take 20 mg by mouth at bedtime.      topiramate (TOPAMAX) 50 MG tablet Take 50 mg by mouth 2 (two) times daily.     No current facility-administered medications  for this visit.    REVIEW OF SYSTEMS:  [X]  denotes positive finding, [ ]  denotes negative finding Cardiac  Comments:  Chest pain or chest pressure:    Shortness of breath upon exertion:    Short of breath when lying flat:    Irregular heart rhythm:        Vascular    Pain in calf, thigh, or hip brought on by ambulation:    Pain in feet at night that wakes you up from your sleep:     Blood clot in your veins:    Leg swelling:         Pulmonary    Oxygen at home:    Productive cough:     Wheezing:         Neurologic    Sudden weakness in arms or legs:     Sudden numbness in arms or legs:     Sudden onset of difficulty speaking or slurred speech:    Temporary loss of vision in one eye:     Problems with dizziness:          Gastrointestinal    Blood in stool:     Vomited blood:         Genitourinary    Burning when urinating:     Blood in urine:        Psychiatric    Major depression:         Hematologic    Bleeding problems:    Problems with blood clotting too easily:        Skin    Rashes or ulcers:        Constitutional    Fever or chills:     PHYSICAL EXAM:   Vitals:   11/18/20 1318  BP: (!) 142/70  Pulse: 69  Resp: 20  Temp: 98.1 F (36.7 C)  SpO2: 96%  Weight: 151 lb (68.5 kg)  Height: 5\' 2"  (1.575 m)   Constitutional: well appearing in no distress. Appears well nourished.  Neurologic: CN intact. no focal findings. no sensory loss. Psychiatric: Mood and affect symmetric and appropriate. Eyes: No icterus. No conjunctival pallor. Ears, nose, throat: mucous membranes moist. Midline trachea.  Cardiac: regular rate and rhythm.  Respiratory: unlabored. Abdominal: soft, non-tender, non-distended.  Peripheral vascular:  2+ radial pulses bilaterally Extremity: No edema. No cyanosis. No pallor.  Skin: No gangrene. No ulceration.  Lymphatic: No Stemmer's sign. No palpable lymphadenopathy.   DATA REVIEW:    Most recent CBC CBC Latest Ref Rng & Units 06/24/2016 06/23/2016 06/22/2016  WBC 4.0 - 10.5 K/uL 14.0(H) 15.7(H) 15.6(H)  Hemoglobin 12.0 - 15.0 g/dL 9.2(L) 9.4(L) 8.9(L)  Hematocrit 36.0 - 46.0 % 27.6(L) 28.2(L) 26.3(L)  Platelets 150 - 400 K/uL 780(H) 835(H) 843(H)     Most recent CMP CMP Latest Ref Rng & Units 06/24/2016 06/23/2016 06/22/2016  Glucose 65 - 99 mg/dL 76 77 -  BUN 6 - 20 mg/dL 39(H) 44(H) -  Creatinine 0.44 - 1.00 mg/dL 3.14(H) 3.30(H) -  Sodium 135 - 145 mmol/L 142 144 -  Potassium 3.5 - 5.1 mmol/L 3.3(L) 3.6 3.8  Chloride 101 - 111 mmol/L 108 116(H) -  CO2 22 - 32 mmol/L 18(L) 16(L) -  Calcium 8.9 - 10.3 mg/dL 9.2 9.3 -  Total Protein 6.5 - 8.1 g/dL - - -  Total Bilirubin 0.3 - 1.2 mg/dL - - -  Alkaline Phos 38 - 126 U/L - - -  AST 15 - 41 U/L - - -  ALT 14 - 54 U/L - - -    Renal function CrCl cannot be calculated (Patient's most recent lab result is older than the maximum 21 days allowed.).  Hgb A1c MFr Bld (%)  Date Value  06/20/2016 4.5 (L)    No results found for: LDLCALC, LDLC, HIRISKLDL, POCLDL, LDLDIRECT, REALLDLC, TOTLDLC   Vascular Imaging: Upper extremity vein mapping   Yevonne Aline. Stanford Breed, MD [facility] Phone Number: [phone] 11/18/2020 12:48 PM

## 2020-11-19 DIAGNOSIS — D571 Sickle-cell disease without crisis: Secondary | ICD-10-CM | POA: Diagnosis not present

## 2020-11-19 DIAGNOSIS — D5720 Sickle-cell/Hb-C disease without crisis: Secondary | ICD-10-CM | POA: Diagnosis not present

## 2020-11-19 DIAGNOSIS — N2581 Secondary hyperparathyroidism of renal origin: Secondary | ICD-10-CM | POA: Diagnosis not present

## 2020-11-19 DIAGNOSIS — N186 End stage renal disease: Secondary | ICD-10-CM | POA: Diagnosis not present

## 2020-11-19 DIAGNOSIS — Z992 Dependence on renal dialysis: Secondary | ICD-10-CM | POA: Diagnosis not present

## 2020-11-20 ENCOUNTER — Other Ambulatory Visit: Payer: Self-pay

## 2020-11-20 DIAGNOSIS — H538 Other visual disturbances: Secondary | ICD-10-CM | POA: Diagnosis not present

## 2020-11-20 DIAGNOSIS — D57219 Sickle-cell/Hb-C disease with crisis, unspecified: Secondary | ICD-10-CM | POA: Diagnosis not present

## 2020-11-20 DIAGNOSIS — E559 Vitamin D deficiency, unspecified: Secondary | ICD-10-CM | POA: Diagnosis not present

## 2020-11-21 ENCOUNTER — Telehealth: Payer: Self-pay | Admitting: Internal Medicine

## 2020-11-21 NOTE — Telephone Encounter (Signed)
   [name] DOB: 06/28/1971 MRN: [id number]   RIDER WAIVER AND RELEASE OF LIABILITY  For purposes of improving physical access to our facilities, [facility] is pleased to partner with third parties to provide [facility] patients or other authorized individuals the option of convenient, on-demand ground transportation services (the [employer]") through use of the technology service that enables users to request on-demand ground transportation from independent third-party providers.  By opting to use and accept these [employer], I, the undersigned, hereby agree on behalf of myself, and on behalf of any minor child using the [employer] for whom I am the parent or legal guardian, as follows:  1. Government social research officer provided to me are provided by independent third-party transportation providers who are not [employer] or employees and who are unaffiliated with [employer]. 2. [place] is neither a transportation carrier nor a common or public carrier. 3. [facility] has no control over the quality or safety of the transportation that occurs as a result of the [employer]. 4. [facility] cannot guarantee that any third-party transportation provider will complete any arranged transportation service. 5. [facility] makes no representation, warranty, or guarantee regarding the reliability, timeliness, quality, safety, suitability, or availability of any of the Transport Services or that they will be error free. 6. I fully understand that traveling by vehicle involves risks and dangers of serious bodily injury, including permanent disability, paralysis, and death. I agree, on behalf of myself and on behalf of any minor child using the Transport Services for whom I am the parent or legal guardian, that the entire risk arising out of my use of the [employer] remains solely with me, to the maximum extent permitted under applicable law. 7. The [employer] are provided "as is" and "as available." [place] [facility] disclaims all representations and warranties, express, implied or statutory, not expressly set out in these terms, including the implied warranties of merchantability and fitness for a particular purpose. 8. I hereby waive and release [facility], its agents, employees, officers, directors, representatives, insurers, attorneys, assigns, successors, subsidiaries, and affiliates from any and all past, present, or future claims, demands, liabilities, actions, causes of action, or suits of any kind directly or indirectly arising from acceptance and use of the [employer]. 9. I further waive and release [facility] and its affiliates from all present and future liability and responsibility for any injury or death to persons or damages to property caused by or related to the use of the [employer]. 10. I have read this Waiver and Release of Liability, and I understand the terms used in it and their legal significance. This Waiver is freely and voluntarily given with the understanding that my right (as well as the right of any minor child for whom I am the parent or legal guardian using the [employer]) to legal recourse against [facility] in connection with the [employer] is knowingly surrendered in return for use of these services.   I attest that I read the consent document to [name], gave Ms. Shifrin the opportunity to ask questions and answered the questions asked (if any). I affirm that [name] then provided consent for she's participation in this program.     [name]

## 2020-11-24 DIAGNOSIS — D5720 Sickle-cell/Hb-C disease without crisis: Secondary | ICD-10-CM | POA: Diagnosis not present

## 2020-11-24 DIAGNOSIS — Z992 Dependence on renal dialysis: Secondary | ICD-10-CM | POA: Diagnosis not present

## 2020-11-24 DIAGNOSIS — N2581 Secondary hyperparathyroidism of renal origin: Secondary | ICD-10-CM | POA: Diagnosis not present

## 2020-11-24 DIAGNOSIS — N186 End stage renal disease: Secondary | ICD-10-CM | POA: Diagnosis not present

## 2020-11-24 DIAGNOSIS — D571 Sickle-cell disease without crisis: Secondary | ICD-10-CM | POA: Diagnosis not present

## 2020-11-26 DIAGNOSIS — N186 End stage renal disease: Secondary | ICD-10-CM | POA: Diagnosis not present

## 2020-11-26 DIAGNOSIS — D5720 Sickle-cell/Hb-C disease without crisis: Secondary | ICD-10-CM | POA: Diagnosis not present

## 2020-11-26 DIAGNOSIS — Z992 Dependence on renal dialysis: Secondary | ICD-10-CM | POA: Diagnosis not present

## 2020-11-26 DIAGNOSIS — N2581 Secondary hyperparathyroidism of renal origin: Secondary | ICD-10-CM | POA: Diagnosis not present

## 2020-11-26 DIAGNOSIS — D571 Sickle-cell disease without crisis: Secondary | ICD-10-CM | POA: Diagnosis not present

## 2020-11-26 DIAGNOSIS — L03012 Cellulitis of left finger: Secondary | ICD-10-CM | POA: Diagnosis not present

## 2020-11-27 DIAGNOSIS — Z23 Encounter for immunization: Secondary | ICD-10-CM | POA: Diagnosis not present

## 2020-11-28 DIAGNOSIS — Z992 Dependence on renal dialysis: Secondary | ICD-10-CM | POA: Diagnosis not present

## 2020-11-28 DIAGNOSIS — N2581 Secondary hyperparathyroidism of renal origin: Secondary | ICD-10-CM | POA: Diagnosis not present

## 2020-11-28 DIAGNOSIS — N186 End stage renal disease: Secondary | ICD-10-CM | POA: Diagnosis not present

## 2020-11-28 DIAGNOSIS — D5720 Sickle-cell/Hb-C disease without crisis: Secondary | ICD-10-CM | POA: Diagnosis not present

## 2020-11-28 DIAGNOSIS — D571 Sickle-cell disease without crisis: Secondary | ICD-10-CM | POA: Diagnosis not present

## 2020-12-01 DIAGNOSIS — Z992 Dependence on renal dialysis: Secondary | ICD-10-CM | POA: Diagnosis not present

## 2020-12-01 DIAGNOSIS — N186 End stage renal disease: Secondary | ICD-10-CM | POA: Diagnosis not present

## 2020-12-01 DIAGNOSIS — N2581 Secondary hyperparathyroidism of renal origin: Secondary | ICD-10-CM | POA: Diagnosis not present

## 2020-12-01 DIAGNOSIS — D5720 Sickle-cell/Hb-C disease without crisis: Secondary | ICD-10-CM | POA: Diagnosis not present

## 2020-12-01 DIAGNOSIS — D571 Sickle-cell disease without crisis: Secondary | ICD-10-CM | POA: Diagnosis not present

## 2020-12-02 ENCOUNTER — Other Ambulatory Visit (HOSPITAL_COMMUNITY)
Admission: RE | Admit: 2020-12-02 | Discharge: 2020-12-02 | Disposition: A | Discharge status: Home / Self Care | Payer: Medicare PPO | Source: Ambulatory Visit | Attending: Vascular Surgery | Admitting: Vascular Surgery

## 2020-12-02 DIAGNOSIS — Z01812 Encounter for preprocedural laboratory examination: Secondary | ICD-10-CM | POA: Diagnosis not present

## 2020-12-02 DIAGNOSIS — Z20822 Contact with and (suspected) exposure to covid-19: Secondary | ICD-10-CM | POA: Insufficient documentation

## 2020-12-02 LAB — SARS CORONAVIRUS 2 (TAT 6-24 HRS): SARS Coronavirus 2: NEGATIVE

## 2020-12-03 ENCOUNTER — Other Ambulatory Visit: Payer: Self-pay

## 2020-12-03 ENCOUNTER — Encounter (HOSPITAL_COMMUNITY): Payer: Self-pay | Admitting: Vascular Surgery

## 2020-12-03 DIAGNOSIS — N186 End stage renal disease: Secondary | ICD-10-CM | POA: Diagnosis not present

## 2020-12-03 DIAGNOSIS — N2581 Secondary hyperparathyroidism of renal origin: Secondary | ICD-10-CM | POA: Diagnosis not present

## 2020-12-03 DIAGNOSIS — D5720 Sickle-cell/Hb-C disease without crisis: Secondary | ICD-10-CM | POA: Diagnosis not present

## 2020-12-03 DIAGNOSIS — D571 Sickle-cell disease without crisis: Secondary | ICD-10-CM | POA: Diagnosis not present

## 2020-12-03 DIAGNOSIS — Z992 Dependence on renal dialysis: Secondary | ICD-10-CM | POA: Diagnosis not present

## 2020-12-03 NOTE — Anesthesia Preprocedure Evaluation (Addendum)
Anesthesia Evaluation  Patient identified by MRN, date of birth, ID band Patient awake    Reviewed: Allergy & Precautions, NPO status , Patient's Chart, lab work & pertinent test results, reviewed documented beta blocker date and time   History of Anesthesia Complications Negative for: history of anesthetic complications  Airway Mallampati: II  TM Distance: >3 FB Neck ROM: Full    Dental no notable dental hx.    Pulmonary neg pulmonary ROS,    Pulmonary exam normal        Cardiovascular hypertension, Pt. on medications and Pt. on home beta blockers Normal cardiovascular exam  Echo 06/10/20 (care everywhere):  - Mild LV dysfunction with moderate LVH - Normal LA pressures with normal diastolic function - Normal RV systolic function - Valvular regurgitation: trivial MR, PR, TR   Neuro/Psych  Headaches, CVA (2010) negative psych ROS   GI/Hepatic negative GI ROS, Neg liver ROS,   Endo/Other  diabetes, Type 2  Renal/GU ESRF and DialysisRenal disease  negative genitourinary   Musculoskeletal negative musculoskeletal ROS (+)   Abdominal   Peds  Hematology  (+) Sickle cell anemia ,   Anesthesia Other Findings Day of surgery medications reviewed with patient.  Reproductive/Obstetrics negative OB ROS                           Anesthesia Physical Anesthesia Plan  ASA: III  Anesthesia Plan: MAC and Regional   Post-op Pain Management:    Induction:   PONV Risk Score and Plan: 2 and Treatment may vary due to age or medical condition, Propofol infusion and Midazolam  Airway Management Planned: Natural Airway and Simple Face Mask  Additional Equipment: None  Intra-op Plan:   Post-operative Plan:   Informed Consent: I have reviewed the patients History and Physical, chart, labs and discussed the procedure including the risks, benefits and alternatives for the proposed anesthesia with the  patient or authorized representative who has indicated his/her understanding and acceptance.       Plan Discussed with: CRNA  Anesthesia Plan Comments: (See APP note by Durel Salts, FNP )      Anesthesia Quick Evaluation

## 2020-12-03 NOTE — Progress Notes (Signed)
Spoke with patient's Daughter Algernon Huxley for PAT information.  [place] states patient does not have any shortness of breath, fever, cough or chest pain.  Cardiologist - Dr Azzie Glatter PCP/Hematology - Dr Retta Mac Nephrology - Dr Salvatore Decent  Chest x-ray - n/a EKG - DOS 12/04/20 Stress Test - 07/06/19 ECHO - 06/10/20 Cardiac Cath - n/a  Diabetes Type 2, diet controlled, no meds. Fasting Blood Sugar - 90s-100s Checks Blood Sugar 1-2 times a day  Anesthesia review: Yes  STOP now taking any Aspirin (unless otherwise instructed by your surgeon), Aleve, Naproxen, Ibuprofen, Motrin, Advil, Goody's, BC's, all herbal medications, fish oil, and all vitamins.   Coronavirus Screening Covid test on 12/02/20 was negative.  Daughter Algernon Huxley verbalized understanding of PAT instructions that were give via phone,

## 2020-12-03 NOTE — Progress Notes (Signed)
Anesthesia Chart Review:  Pt is a same day work up    Case: [id number] Date/Time: 12/04/20 0715   Procedure: LEFT BRACHIOCEPHALIC ARTERIOVENOUS (AV) FISTULA CREATION (Left ) - PERIPHERAL NERVE BLOCK   Anesthesia type: Monitor Anesthesia Care   Pre-op diagnosis: ESRD   Location: [facility] OR ROOM 12 / [facility]   Surgeons: [name], MD      DISCUSSION:  Pt is 50 years old with hx HTN, DM, ESRD (on hemodialysis), stroke  Hospitalized 8/18-20/21 at [facility] (see care everywhere) for hypertensive emergency, intractable headache after missing dialysis   PROVIDERS: - PCP is Osei-Bonsu, Iona Beard, MD   LABS: Will be obtained day of surgery    IMAGES: CXR 05/07/20 (care everywhere):  - New tunneled right internal jugular catheter terminatesat the superior  cavoatrial junction.  - There is slightly smaller lung volumes with increasing linear bibasilar  opacities most likely representing scar. Lungs otherwise clear.  - No pleural effusion or pneumothorax.  - Unchanged heart and mediastinum with normal heart size.   CT brain 05/07/20 (care everywhere):  1. No acute intracranial abnormalities.  2. Chronic appearing encephalomalacia involving the high left  parietal/occipital lobes with ex vacuo dilatation of the adjacent left  lateral ventricle.     EKG: Will be obtained day of surgery EKG 05/07/20 report in care everywhere documents: NSR. LVH with repolarization abnormality, prolonged QT (QTc 516)   CV: Echo 06/10/20 (care everywhere):  - Mild LV dysfunction with moderate LVH - Normal LA pressures with normal diastolic function - Normal RV systolic function - Valvular regurgitation: trivial MR, PR, TR   PET stress test 03/2020 (care everywhere): - Cardiac PET/CT Myocardial Perfusion Imaging is Normal.  - Normal left ventricular systolic function.     Past Medical History:  Diagnosis Date  . Chronic kidney disease   . Frequent headaches   . Hematuria 06/20/2016  . High  cholesterol   . Hypertension   . Migraine    " I constantly have my migraines" (06/23/2016)  . Pneumonia   . Short-term memory loss    Archie Endo 06/23/2016  . Sickle cell trait ([place])   . Stroke [facility]) 2010   short term memory loss/notes 06/23/2016  . Type 2 diabetes, diet controlled ([place])     Past Surgical History:  Procedure Laterality Date  . CESAREAN SECTION  X 2  . TUBAL LIGATION      MEDICATIONS: No current facility-administered medications for this encounter.   Marland Kitchen acetaminophen (TYLENOL) 500 MG tablet  . albuterol (VENTOLIN HFA) 108 (90 Base) MCG/ACT inhaler  . atorvastatin (LIPITOR) 40 MG tablet  . B Complex-C-Folic Acid (DIALYVITE TABLET) TABS  . carvedilol (COREG) 25 MG tablet  . cinacalcet (SENSIPAR) 60 MG tablet  . hydroxyurea (HYDREA) 500 MG capsule  . NIFEdipine (ADALAT CC) 30 MG 24 hr tablet  . Oxycodone HCl 10 MG TABS  . sevelamer carbonate (RENVELA) 800 MG tablet    If labs acceptable day of surgery, I anticipate pt can proceed with surgery as scheduled.  Willeen Cass, PhD, FNP-BC [facility] Short Stay [facility]/Anesthesiology Phone: [phone] 12/03/2020 10:55 AM

## 2020-12-04 ENCOUNTER — Ambulatory Visit (HOSPITAL_COMMUNITY): Payer: Medicare PPO | Admitting: Emergency Medicine

## 2020-12-04 ENCOUNTER — Other Ambulatory Visit: Payer: Self-pay

## 2020-12-04 ENCOUNTER — Ambulatory Visit (HOSPITAL_COMMUNITY)
Admission: RE | Admit: 2020-12-04 | Discharge: 2020-12-04 | Disposition: A | Discharge status: Home / Self Care | Payer: Medicare PPO | Attending: Vascular Surgery | Admitting: Vascular Surgery

## 2020-12-04 ENCOUNTER — Encounter (HOSPITAL_COMMUNITY): Payer: Self-pay | Admitting: Vascular Surgery

## 2020-12-04 ENCOUNTER — Encounter (HOSPITAL_COMMUNITY)
Admission: RE | Disposition: A | Discharge status: Home / Self Care | Payer: Self-pay | Source: Home / Self Care | Attending: Vascular Surgery

## 2020-12-04 DIAGNOSIS — Z888 Allergy status to other drugs, medicaments and biological substances status: Secondary | ICD-10-CM | POA: Insufficient documentation

## 2020-12-04 DIAGNOSIS — Z79899 Other long term (current) drug therapy: Secondary | ICD-10-CM | POA: Insufficient documentation

## 2020-12-04 DIAGNOSIS — N186 End stage renal disease: Secondary | ICD-10-CM | POA: Insufficient documentation

## 2020-12-04 DIAGNOSIS — I120 Hypertensive chronic kidney disease with stage 5 chronic kidney disease or end stage renal disease: Secondary | ICD-10-CM | POA: Insufficient documentation

## 2020-12-04 DIAGNOSIS — N185 Chronic kidney disease, stage 5: Secondary | ICD-10-CM | POA: Diagnosis not present

## 2020-12-04 DIAGNOSIS — Z992 Dependence on renal dialysis: Secondary | ICD-10-CM | POA: Diagnosis not present

## 2020-12-04 DIAGNOSIS — E1122 Type 2 diabetes mellitus with diabetic chronic kidney disease: Secondary | ICD-10-CM | POA: Diagnosis not present

## 2020-12-04 DIAGNOSIS — N179 Acute kidney failure, unspecified: Secondary | ICD-10-CM | POA: Diagnosis not present

## 2020-12-04 HISTORY — DX: Personal history of other medical treatment: Z92.89

## 2020-12-04 HISTORY — PX: AV FISTULA PLACEMENT: SHX1204

## 2020-12-04 LAB — POCT I-STAT, CHEM 8
BUN: 39 mg/dL — ABNORMAL HIGH (ref 6–20)
Calcium, Ion: 1.1 mmol/L — ABNORMAL LOW (ref 1.15–1.40)
Chloride: 98 mmol/L (ref 98–111)
Creatinine, Ser: 7.3 mg/dL — ABNORMAL HIGH (ref 0.44–1.00)
Glucose, Bld: 95 mg/dL (ref 70–99)
HCT: 32 % — ABNORMAL LOW (ref 36.0–46.0)
Hemoglobin: 10.9 g/dL — ABNORMAL LOW (ref 12.0–15.0)
Potassium: 5 mmol/L (ref 3.5–5.1)
Sodium: 139 mmol/L (ref 135–145)
TCO2: 33 mmol/L — ABNORMAL HIGH (ref 22–32)

## 2020-12-04 LAB — GLUCOSE, CAPILLARY
Glucose-Capillary: 89 mg/dL (ref 70–99)
Glucose-Capillary: 72 mg/dL (ref 70–99)

## 2020-12-04 LAB — POCT PREGNANCY, URINE: Preg Test, Ur: NEGATIVE

## 2020-12-04 SURGERY — ARTERIOVENOUS (AV) FISTULA CREATION
Anesthesia: Monitor Anesthesia Care | Laterality: Left

## 2020-12-04 MED ORDER — HYDROCODONE-ACETAMINOPHEN 5-325 MG PO TABS: 1.0000 | ORAL_TABLET | ORAL | 0 refills | Status: DC | PRN

## 2020-12-04 MED ORDER — CEFAZOLIN SODIUM 1 G IJ SOLR
INTRAMUSCULAR | Status: AC
  Filled 2020-12-04: qty 20

## 2020-12-04 MED ORDER — SODIUM CHLORIDE 0.9 % IV SOLN: INTRAVENOUS | Status: DC

## 2020-12-04 MED ORDER — CHLORHEXIDINE GLUCONATE 0.12 % MT SOLN
OROMUCOSAL | Status: AC
  Administered 2020-12-04: 15 mL via OROMUCOSAL
  Filled 2020-12-04: qty 15

## 2020-12-04 MED ORDER — ACETAMINOPHEN 500 MG PO TABS: 1000.0000 mg | ORAL_TABLET | Freq: Once | ORAL | Status: AC

## 2020-12-04 MED ORDER — PAPAVERINE HCL 30 MG/ML IJ SOLN
INTRAMUSCULAR | Status: DC | PRN
  Administered 2020-12-04: 60 mg via INTRAVENOUS

## 2020-12-04 MED ORDER — PROPOFOL 500 MG/50ML IV EMUL
INTRAVENOUS | Status: DC | PRN
  Administered 2020-12-04: 75 ug/kg/min via INTRAVENOUS

## 2020-12-04 MED ORDER — OXYCODONE HCL 5 MG PO TABS
5.0000 mg | ORAL_TABLET | Freq: Once | ORAL | Status: AC | PRN
  Administered 2020-12-04: 5 mg via ORAL

## 2020-12-04 MED ORDER — SODIUM CHLORIDE 0.9 % IV SOLN
INTRAVENOUS | Status: AC
  Filled 2020-12-04: qty 1.2

## 2020-12-04 MED ORDER — OXYCODONE HCL 5 MG/5ML PO SOLN
5.0000 mg | Freq: Once | ORAL | Status: AC | PRN
Start: 2020-12-04 — End: 2020-12-04

## 2020-12-04 MED ORDER — OXYCODONE HCL 5 MG PO TABS
ORAL_TABLET | ORAL | Status: AC
  Filled 2020-12-04: qty 1

## 2020-12-04 MED ORDER — CEFAZOLIN SODIUM-DEXTROSE 2-4 GM/100ML-% IV SOLN
2.0000 g | INTRAVENOUS | Status: AC
  Administered 2020-12-04: 2 g via INTRAVENOUS

## 2020-12-04 MED ORDER — ORAL CARE MOUTH RINSE: 15.0000 mL | Freq: Once | OROMUCOSAL | Status: AC

## 2020-12-04 MED ORDER — SODIUM CHLORIDE 0.9 % IV SOLN: INTRAVENOUS | Status: DC | PRN

## 2020-12-04 MED ORDER — PROPOFOL 10 MG/ML IV BOLUS
INTRAVENOUS | Status: DC | PRN
  Administered 2020-12-04: 25 mg via INTRAVENOUS
  Administered 2020-12-04: 35 mg via INTRAVENOUS
  Administered 2020-12-04: 25 mg via INTRAVENOUS

## 2020-12-04 MED ORDER — FENTANYL CITRATE (PF) 250 MCG/5ML IJ SOLN
INTRAMUSCULAR | Status: DC | PRN
  Administered 2020-12-04: 50 ug via INTRAVENOUS
  Administered 2020-12-04 (×3): 25 ug via INTRAVENOUS
  Administered 2020-12-04: 50 ug via INTRAVENOUS

## 2020-12-04 MED ORDER — PROPOFOL 10 MG/ML IV BOLUS
INTRAVENOUS | Status: AC
  Filled 2020-12-04: qty 20

## 2020-12-04 MED ORDER — LIDOCAINE 2% (20 MG/ML) 5 ML SYRINGE
INTRAMUSCULAR | Status: DC | PRN
  Administered 2020-12-04: 80 mg via INTRAVENOUS

## 2020-12-04 MED ORDER — LIDOCAINE-EPINEPHRINE (PF) 1 %-1:200000 IJ SOLN
INTRAMUSCULAR | Status: AC
  Filled 2020-12-04: qty 30

## 2020-12-04 MED ORDER — FENTANYL CITRATE (PF) 250 MCG/5ML IJ SOLN
INTRAMUSCULAR | Status: AC
  Filled 2020-12-04: qty 5

## 2020-12-04 MED ORDER — CHLORHEXIDINE GLUCONATE 0.12 % MT SOLN: 15.0000 mL | Freq: Once | OROMUCOSAL | Status: AC

## 2020-12-04 MED ORDER — MEPIVACAINE HCL (PF) 2 % IJ SOLN
INTRAMUSCULAR | Status: DC | PRN
  Administered 2020-12-04: 30 mL

## 2020-12-04 MED ORDER — PROMETHAZINE HCL 25 MG/ML IJ SOLN: 6.2500 mg | INTRAMUSCULAR | Status: DC | PRN

## 2020-12-04 MED ORDER — FENTANYL CITRATE (PF) 100 MCG/2ML IJ SOLN
INTRAMUSCULAR | Status: AC
  Filled 2020-12-04: qty 2

## 2020-12-04 MED ORDER — CHLORHEXIDINE GLUCONATE 4 % EX LIQD: 60.0000 mL | Freq: Once | CUTANEOUS | Status: DC

## 2020-12-04 MED ORDER — MIDAZOLAM HCL 2 MG/2ML IJ SOLN
INTRAMUSCULAR | Status: AC
  Filled 2020-12-04: qty 2

## 2020-12-04 MED ORDER — FENTANYL CITRATE (PF) 100 MCG/2ML IJ SOLN
25.0000 ug | INTRAMUSCULAR | Status: DC | PRN
  Administered 2020-12-04 (×3): 50 ug via INTRAVENOUS

## 2020-12-04 MED ORDER — LACTATED RINGERS IV SOLN: INTRAVENOUS | Status: DC

## 2020-12-04 MED ORDER — CARVEDILOL 12.5 MG PO TABS
25.0000 mg | ORAL_TABLET | Freq: Once | ORAL | Status: AC
  Administered 2020-12-04: 25 mg via ORAL
  Filled 2020-12-04: qty 2

## 2020-12-04 MED ORDER — 0.9 % SODIUM CHLORIDE (POUR BTL) OPTIME
TOPICAL | Status: DC | PRN
  Administered 2020-12-04: 1000 mL

## 2020-12-04 MED ORDER — MIDAZOLAM HCL 5 MG/5ML IJ SOLN
INTRAMUSCULAR | Status: DC | PRN
  Administered 2020-12-04 (×2): 1 mg via INTRAVENOUS

## 2020-12-04 MED ORDER — PROPOFOL 1000 MG/100ML IV EMUL
INTRAVENOUS | Status: AC
  Filled 2020-12-04: qty 100

## 2020-12-04 MED ORDER — ACETAMINOPHEN 500 MG PO TABS
ORAL_TABLET | ORAL | Status: AC
  Administered 2020-12-04: 1000 mg via ORAL
  Filled 2020-12-04: qty 2

## 2020-12-04 MED ORDER — CEFAZOLIN SODIUM-DEXTROSE 2-4 GM/100ML-% IV SOLN
INTRAVENOUS | Status: AC
  Filled 2020-12-04: qty 100

## 2020-12-04 MED ORDER — PAPAVERINE HCL 30 MG/ML IJ SOLN
INTRAMUSCULAR | Status: AC
  Filled 2020-12-04: qty 2

## 2020-12-04 MED ORDER — HEPARIN SODIUM (PORCINE) 1000 UNIT/ML IJ SOLN
1000.0000 [IU] | Freq: Once | INTRAMUSCULAR | Status: AC
  Administered 2020-12-04: 1000 [IU] via INTRAVENOUS

## 2020-12-04 SURGICAL SUPPLY — 36 items
ARMBAND PINK RESTRICT EXTREMIT (MISCELLANEOUS) ×2 IMPLANT
BENZOIN TINCTURE PRP APPL 2/3 (GAUZE/BANDAGES/DRESSINGS) ×2 IMPLANT
CANISTER SUCT 3000ML PPV (MISCELLANEOUS) ×2 IMPLANT
CANNULA VESSEL 3MM 2 BLNT TIP (CANNULA) ×4 IMPLANT
CHLORAPREP W/TINT 26 (MISCELLANEOUS) ×2 IMPLANT
CLIP VESOCCLUDE MED 6/CT (CLIP) ×2 IMPLANT
CLIP VESOCCLUDE SM WIDE 6/CT (CLIP) ×2 IMPLANT
COVER PROBE W GEL 5X96 (DRAPES) ×2 IMPLANT
DERMABOND ADVANCED (GAUZE/BANDAGES/DRESSINGS) ×1
DERMABOND ADVANCED .7 DNX12 (GAUZE/BANDAGES/DRESSINGS) ×1 IMPLANT
DRAPE EXTREMITY T 121X128X90 (DISPOSABLE) ×2 IMPLANT
DRSG COVADERM 4X6 (GAUZE/BANDAGES/DRESSINGS) ×2 IMPLANT
ELECT REM PT RETURN 9FT ADLT (ELECTROSURGICAL) ×2
ELECTRODE REM PT RTRN 9FT ADLT (ELECTROSURGICAL) ×1 IMPLANT
GLOVE SURG SS PI 8.0 STRL IVOR (GLOVE) ×2 IMPLANT
GOWN STRL REUS W/ TWL LRG LVL3 (GOWN DISPOSABLE) ×2 IMPLANT
GOWN STRL REUS W/ TWL XL LVL3 (GOWN DISPOSABLE) ×1 IMPLANT
GOWN STRL REUS W/TWL LRG LVL3 (GOWN DISPOSABLE) ×2
GOWN STRL REUS W/TWL XL LVL3 (GOWN DISPOSABLE) ×1
INSERT FOGARTY SM (MISCELLANEOUS) IMPLANT
KIT BASIN OR (CUSTOM PROCEDURE TRAY) ×2 IMPLANT
KIT TURNOVER KIT B (KITS) ×2 IMPLANT
NEEDLE 18GX1X1/2 (RX/OR ONLY) (NEEDLE) IMPLANT
NS IRRIG 1000ML POUR BTL (IV SOLUTION) ×2 IMPLANT
PACK CV ACCESS (CUSTOM PROCEDURE TRAY) ×2 IMPLANT
PAD ARMBOARD 7.5X6 YLW CONV (MISCELLANEOUS) ×4 IMPLANT
STRIP CLOSURE SKIN 1/2X4 (GAUZE/BANDAGES/DRESSINGS) ×2 IMPLANT
STRIP SURGICAL 1/4 X 6 IN (GAUZE/BANDAGES/DRESSINGS) ×2 IMPLANT
SUT MNCRL AB 4-0 PS2 18 (SUTURE) ×2 IMPLANT
SUT PROLENE 6 0 BV (SUTURE) ×6 IMPLANT
SUT VIC AB 3-0 SH 27 (SUTURE) ×1
SUT VIC AB 3-0 SH 27X BRD (SUTURE) ×1 IMPLANT
SYR 3ML LL SCALE MARK (SYRINGE) ×2 IMPLANT
TOWEL GREEN STERILE (TOWEL DISPOSABLE) ×2 IMPLANT
UNDERPAD 30X36 HEAVY ABSORB (UNDERPADS AND DIAPERS) ×2 IMPLANT
WATER STERILE IRR 1000ML POUR (IV SOLUTION) ×2 IMPLANT

## 2020-12-04 NOTE — Anesthesia Procedure Notes (Signed)
Procedure Name: MAC Date/Time: 12/04/2020 7:36 AM Performed by: Colin Benton, CRNA Pre-anesthesia Checklist: Patient identified, Emergency Drugs available, Suction available and Patient being monitored Patient Re-evaluated:Patient Re-evaluated prior to induction Oxygen Delivery Method: Nasal cannula Induction Type: IV induction Placement Confirmation: positive ETCO2 Dental Injury: Teeth and Oropharynx as per pre-operative assessment

## 2020-12-04 NOTE — Progress Notes (Signed)
Orthopedic Tech Progress Note Patient Details:  Julie [facility] [birth year] [phone] Left sling with RN Ortho Devices Type of Ortho Device: Arm sling Ortho Device/Splint Interventions: Ordered   Post Interventions Patient Tolerated: Other (comment) Instructions Provided: Other (comment)   [name] 12/04/2020, 9:50 AM

## 2020-12-04 NOTE — Transfer of Care (Signed)
Immediate Anesthesia Transfer of Care Note  Patient: Julie Wells  Procedure(s) Performed: LEFT BRACHOBASCILIC  ARTERIOVENOUS (AV) FISTULA CREATION (Left )  Patient Location: PACU  Anesthesia Type:MAC combined with regional for post-op pain  Level of Consciousness: awake, alert , oriented and patient cooperative  Airway & Oxygen Therapy: Patient Spontanous Breathing and Patient connected to nasal cannula oxygen  Post-op Assessment: Report given to RN, Post -op Vital signs reviewed and stable and Patient moving all extremities X 4  Post vital signs: Reviewed and stable  Last Vitals:  Vitals Value Taken Time  BP 139/79 12/04/20 0908  Temp    Pulse 74 12/04/20 0911  Resp 13 12/04/20 0911  SpO2 100 % 12/04/20 0911  Vitals shown include unvalidated device data.  Last Pain:  Vitals:   12/04/20 0635  TempSrc:   PainSc: 0-No pain         Complications: No complications documented.

## 2020-12-04 NOTE — Anesthesia Postprocedure Evaluation (Signed)
Anesthesia Post Note  Patient: Julie Wells  Procedure(s) Performed: LEFT BRACHOBASCILIC  ARTERIOVENOUS (AV) FISTULA CREATION (Left )     Patient location during evaluation: PACU Anesthesia Type: Regional Level of consciousness: awake and alert and oriented Pain management: pain level controlled Vital Signs Assessment: post-procedure vital signs reviewed and stable Respiratory status: spontaneous breathing, nonlabored ventilation and respiratory function stable Cardiovascular status: blood pressure returned to baseline Postop Assessment: no apparent nausea or vomiting Anesthetic complications: no   No complications documented.  Last Vitals:  Vitals:   12/04/20 1000 12/04/20 1009  BP:  (!) 142/79  Pulse: 67 65  Resp: 11 13  Temp:    SpO2: 97% 96%           Brennan Bailey

## 2020-12-04 NOTE — Anesthesia Procedure Notes (Signed)
Anesthesia Regional Block: Supraclavicular block   Pre-Anesthetic Checklist: ,, timeout performed, Correct Patient, Correct Site, Correct Laterality, Correct Procedure, Correct Position, site marked, Risks and benefits discussed, pre-op evaluation,  At surgeon's request and post-op pain management  Laterality: Left  Prep: Maximum Sterile Barrier Precautions used, chloraprep       Needles:  Injection technique: Single-shot  Needle Type: Echogenic Stimulator Needle     Needle Length: 9cm  Needle Gauge: 22     Additional Needles:   Procedures:,,,, ultrasound used (permanent image in chart),,,,  Narrative:  Start time: 12/04/2020 7:11 AM End time: 12/04/2020 7:13 AM Injection made incrementally with aspirations every 5 mL.  Performed by: Personally  Anesthesiologist: Brennan Bailey, MD  Additional Notes: Risks, benefits, and alternative discussed. Patient gave consent for procedure. Patient prepped and draped in sterile fashion. Sedation administered, patient remains easily responsive to voice. Relevant anatomy identified with ultrasound guidance. Local anesthetic given in 5cc increments with no signs or symptoms of intravascular injection. No pain or paraesthesias with injection. Patient monitored throughout procedure with signs of LAST or immediate complications. Tolerated well. Ultrasound image placed in chart.  Tawny Asal, MD

## 2020-12-04 NOTE — Interval H&P Note (Signed)
History and Physical Interval Note:  12/04/2020 7:24 AM  Julie Wells  has presented today for surgery, with the diagnosis of ESRD.  The various methods of treatment have been discussed with the patient and family. After consideration of risks, benefits and other options for treatment, the patient has consented to  Procedure(s) with comments: LEFT BRACHIOCEPHALIC ARTERIOVENOUS (AV) FISTULA CREATION (Left) - PERIPHERAL NERVE BLOCK as a surgical intervention.  The patient's history has been reviewed, patient examined, no change in status, stable for surgery.  I have reviewed the patient's chart and labs.  Questions were answered to the patient's satisfaction.     [name]

## 2020-12-04 NOTE — Op Note (Signed)
DATE OF SERVICE: 12/04/2020  PATIENT:  Julie Wells  50 y.o. female  PRE-OPERATIVE DIAGNOSIS:  ESRD on HD  POST-OPERATIVE DIAGNOSIS:  Same  PROCEDURE:   Left first stage basilic vein transposition  SURGEON:  Surgeon(s) and Role:    * [name], MD - Primary  ASSISTANT: Risa Grill, PA-C  An assistant was required to facilitate exposure and expedite the case.  ANESTHESIA:   regional and MAC  EBL: min  BLOOD ADMINISTERED:none  DRAINS: none   LOCAL MEDICATIONS USED:  LIDOCAINE   SPECIMEN:  none  COUNTS: confirmed correct .  TOURNIQUET:  None  PATIENT DISPOSITION:  PACU - hemodynamically stable.   Delay start of Pharmacological VTE agent (>24hrs) due to surgical blood loss or risk of bleeding: no  INDICATION FOR PROCEDURE: Julie Wells is a 50 y.o. female with ESRD on HD. After careful discussion of risks, benefits, and alternatives the patient was offered left upper extremity arteriovenous fistula. We specifically discussed risk of steal syndrome. The patient understood and wished to proceed.  OPERATIVE FINDINGS:  Intraoperative ultrasound showed poor quality cephalic vein at the antecubitum Basilic vein of better quality about the distal arm Vein / artery both heavily spasm during exposure Doppler flow noted in AVF at completion.  DESCRIPTION OF PROCEDURE: After identification of the patient in the pre-operative holding area, the patient was transferred to the operating room. The patient was positioned supine on the operating room table. Anesthesia was induced. The left arm was prepped and draped in standard fashion. A surgical pause was performed confirming correct patient, procedure, and operative location.  Using intraoperative ultrasound the left brachial artery and basilic vein were mapped.  A curvilinear incision was planned over the course of the two vessels to allow fistula creation.  Incision was created.  Incision was carried down through subcutaneous  tissue.  The aponeurosis of the biceps tendon was divided.  The brachial sheath was identified.  The brachial artery was skeletonized.  The artery was encircled with 2 Silastic Vesseloops.  Next attention was turned to the basilic vein.  This was identified in the medial arm in its typical position.  The vein was mobilized throughout the length of the incision to allow tension-free arteriovenous fistula creation.  The distal end of the vein was clamped with a right angle.  The proximal end of the vein was clamped with a bulldog.  The vein was transected distally.  The stump was oversewn with a 2-0 silk.  The cut end of the vein was spatulated and distended with a mosquito clamp.  Patient was systemically heparinized with 3000 units of IV heparin.  After a three minute pause, the brachial artery was clamped proximally distally.  The basilic vein was anastomosed to the brachial artery into side using continuous running suture of 6-0 Prolene.  Immediately prior to completion the anastomosis was flushed and de-aired.  The anastomosis was completed.  Clamps were released.  Hemostasis was achieved.  An audible bruit  was heard in the fistula.  Palpable pulse was felt in the left wrist.  Stasis was achieved in the surgical bed.  The wound was closed with 3-0 Vicryl and 4-0 Monocryl.  Upon completion of the case instrument and sharps counts were confirmed correct. The patient was transferred to the PACU in good condition. I was present for all portions of the procedure.  Julie Aline. Stanford Breed, MD [facility] Phone Number: [phone] 12/04/2020 9:17 AM

## 2020-12-04 NOTE — Discharge Instructions (Signed)
° °  [facility] [place] ° °Discharge Instructions ° °AV Fistula or Graft Surgery for Dialysis Access ° °Please refer to the following instructions for your post-procedure care. Your surgeon or physician assistant will discuss any changes with you. ° °Activity ° °You may drive the day following your surgery, if you are comfortable and no longer taking prescription pain medication. Resume full activity as the soreness in your incision resolves. ° °Bathing/Showering ° °You may shower after you go home. Keep your incision dry for 48 hours. Do not soak in a bathtub, hot tub, or swim until the incision heals completely. You may not shower if you have a hemodialysis catheter. ° °Incision Care ° °Clean your incision with mild soap and water after 48 hours. Pat the area dry with a clean towel. You do not need a bandage unless otherwise instructed. Do not apply any ointments or creams to your incision. You may have skin glue on your incision. Do not peel it off. It will come off on its own in about one week. Your arm may swell a bit after surgery. To reduce swelling use pillows to elevate your arm so it is above your heart. Your doctor will tell you if you need to lightly wrap your arm with an ACE bandage. ° °Diet ° °Resume your normal diet. There are not special food restrictions following this procedure. In order to heal from your surgery, it is CRITICAL to get adequate nutrition. Your body requires vitamins, minerals, and protein. Vegetables are the best source of vitamins and minerals. Vegetables also provide the perfect balance of protein. Processed food has little nutritional value, so try to avoid this. ° °Medications ° °Resume taking all of your medications. If your incision is causing pain, you may take over-the counter pain relievers such as acetaminophen (Tylenol). If you were prescribed a stronger pain medication, please be aware these medications can cause nausea and constipation. Prevent  nausea by taking the medication with a snack or meal. Avoid constipation by drinking plenty of fluids and eating foods with high amount of fiber, such as fruits, vegetables, and grains. Do not take Tylenol if you are taking prescription pain medications. ° ° ° ° °Follow up °Your surgeon may want to see you in the office following your access surgery. If so, this will be arranged at the time of your surgery. ° °Please call us immediately for any of the following conditions: ° °Increased pain, redness, drainage (pus) from your incision site °Fever of 101 degrees or higher °Severe or worsening pain at your incision site °Hand pain or numbness. ° °Reduce your risk of vascular disease: ° °Stop smoking. If you would like help, call [facility] at 1-800-QUIT-NOW (1-800-784-8669) or [facility] at 336-586-4000 ° °Manage your cholesterol °Maintain a desired weight °Control your diabetes °Keep your blood pressure down ° °Dialysis ° °It will take several weeks to several months for your new dialysis access to be ready for use. Your surgeon will determine when it is OK to use it. Your nephrologist will continue to direct your dialysis. You can continue to use your Permcath until your new access is ready for use. ° °If you have any questions, please call the office at [phone]. ° °

## 2020-12-05 ENCOUNTER — Encounter (HOSPITAL_COMMUNITY): Payer: Self-pay | Admitting: Vascular Surgery

## 2020-12-08 DIAGNOSIS — D571 Sickle-cell disease without crisis: Secondary | ICD-10-CM | POA: Diagnosis not present

## 2020-12-08 DIAGNOSIS — N186 End stage renal disease: Secondary | ICD-10-CM | POA: Diagnosis not present

## 2020-12-08 DIAGNOSIS — N2581 Secondary hyperparathyroidism of renal origin: Secondary | ICD-10-CM | POA: Diagnosis not present

## 2020-12-08 DIAGNOSIS — Z992 Dependence on renal dialysis: Secondary | ICD-10-CM | POA: Diagnosis not present

## 2020-12-08 DIAGNOSIS — D5720 Sickle-cell/Hb-C disease without crisis: Secondary | ICD-10-CM | POA: Diagnosis not present

## 2020-12-10 DIAGNOSIS — N186 End stage renal disease: Secondary | ICD-10-CM | POA: Diagnosis not present

## 2020-12-10 DIAGNOSIS — N2581 Secondary hyperparathyroidism of renal origin: Secondary | ICD-10-CM | POA: Diagnosis not present

## 2020-12-10 DIAGNOSIS — Z992 Dependence on renal dialysis: Secondary | ICD-10-CM | POA: Diagnosis not present

## 2020-12-10 DIAGNOSIS — D5720 Sickle-cell/Hb-C disease without crisis: Secondary | ICD-10-CM | POA: Diagnosis not present

## 2020-12-10 DIAGNOSIS — D571 Sickle-cell disease without crisis: Secondary | ICD-10-CM | POA: Diagnosis not present

## 2020-12-12 DIAGNOSIS — N2581 Secondary hyperparathyroidism of renal origin: Secondary | ICD-10-CM | POA: Diagnosis not present

## 2020-12-12 DIAGNOSIS — N186 End stage renal disease: Secondary | ICD-10-CM | POA: Diagnosis not present

## 2020-12-12 DIAGNOSIS — D5720 Sickle-cell/Hb-C disease without crisis: Secondary | ICD-10-CM | POA: Diagnosis not present

## 2020-12-12 DIAGNOSIS — D571 Sickle-cell disease without crisis: Secondary | ICD-10-CM | POA: Diagnosis not present

## 2020-12-12 DIAGNOSIS — Z992 Dependence on renal dialysis: Secondary | ICD-10-CM | POA: Diagnosis not present

## 2020-12-15 DIAGNOSIS — N2581 Secondary hyperparathyroidism of renal origin: Secondary | ICD-10-CM | POA: Diagnosis not present

## 2020-12-15 DIAGNOSIS — N186 End stage renal disease: Secondary | ICD-10-CM | POA: Diagnosis not present

## 2020-12-15 DIAGNOSIS — D5720 Sickle-cell/Hb-C disease without crisis: Secondary | ICD-10-CM | POA: Diagnosis not present

## 2020-12-15 DIAGNOSIS — Z992 Dependence on renal dialysis: Secondary | ICD-10-CM | POA: Diagnosis not present

## 2020-12-15 DIAGNOSIS — D571 Sickle-cell disease without crisis: Secondary | ICD-10-CM | POA: Diagnosis not present

## 2020-12-17 DIAGNOSIS — D571 Sickle-cell disease without crisis: Secondary | ICD-10-CM | POA: Diagnosis not present

## 2020-12-17 DIAGNOSIS — Z992 Dependence on renal dialysis: Secondary | ICD-10-CM | POA: Diagnosis not present

## 2020-12-17 DIAGNOSIS — N186 End stage renal disease: Secondary | ICD-10-CM | POA: Diagnosis not present

## 2020-12-17 DIAGNOSIS — D5720 Sickle-cell/Hb-C disease without crisis: Secondary | ICD-10-CM | POA: Diagnosis not present

## 2020-12-17 DIAGNOSIS — N2581 Secondary hyperparathyroidism of renal origin: Secondary | ICD-10-CM | POA: Diagnosis not present

## 2020-12-18 ENCOUNTER — Other Ambulatory Visit: Payer: Self-pay

## 2020-12-18 DIAGNOSIS — N186 End stage renal disease: Secondary | ICD-10-CM | POA: Diagnosis not present

## 2020-12-18 DIAGNOSIS — Z992 Dependence on renal dialysis: Secondary | ICD-10-CM | POA: Diagnosis not present

## 2020-12-18 DIAGNOSIS — D571 Sickle-cell disease without crisis: Secondary | ICD-10-CM | POA: Diagnosis not present

## 2020-12-19 DIAGNOSIS — D571 Sickle-cell disease without crisis: Secondary | ICD-10-CM | POA: Diagnosis not present

## 2020-12-19 DIAGNOSIS — D5720 Sickle-cell/Hb-C disease without crisis: Secondary | ICD-10-CM | POA: Diagnosis not present

## 2020-12-19 DIAGNOSIS — Z992 Dependence on renal dialysis: Secondary | ICD-10-CM | POA: Diagnosis not present

## 2020-12-19 DIAGNOSIS — N2581 Secondary hyperparathyroidism of renal origin: Secondary | ICD-10-CM | POA: Diagnosis not present

## 2020-12-19 DIAGNOSIS — N186 End stage renal disease: Secondary | ICD-10-CM | POA: Diagnosis not present

## 2020-12-22 DIAGNOSIS — D571 Sickle-cell disease without crisis: Secondary | ICD-10-CM | POA: Diagnosis not present

## 2020-12-22 DIAGNOSIS — D5720 Sickle-cell/Hb-C disease without crisis: Secondary | ICD-10-CM | POA: Diagnosis not present

## 2020-12-22 DIAGNOSIS — N186 End stage renal disease: Secondary | ICD-10-CM | POA: Diagnosis not present

## 2020-12-22 DIAGNOSIS — N2581 Secondary hyperparathyroidism of renal origin: Secondary | ICD-10-CM | POA: Diagnosis not present

## 2020-12-22 DIAGNOSIS — Z992 Dependence on renal dialysis: Secondary | ICD-10-CM | POA: Diagnosis not present

## 2020-12-23 ENCOUNTER — Other Ambulatory Visit: Payer: Self-pay

## 2020-12-23 DIAGNOSIS — Z992 Dependence on renal dialysis: Secondary | ICD-10-CM

## 2020-12-23 DIAGNOSIS — N186 End stage renal disease: Secondary | ICD-10-CM

## 2020-12-24 ENCOUNTER — Other Ambulatory Visit (HOSPITAL_COMMUNITY)
Admission: RE | Admit: 2020-12-24 | Discharge: 2020-12-24 | Disposition: A | Discharge status: Home / Self Care | Payer: Medicare PPO | Source: Ambulatory Visit | Attending: Vascular Surgery | Admitting: Vascular Surgery

## 2020-12-24 DIAGNOSIS — D571 Sickle-cell disease without crisis: Secondary | ICD-10-CM | POA: Diagnosis not present

## 2020-12-24 DIAGNOSIS — Z01812 Encounter for preprocedural laboratory examination: Secondary | ICD-10-CM | POA: Insufficient documentation

## 2020-12-24 DIAGNOSIS — N186 End stage renal disease: Secondary | ICD-10-CM | POA: Diagnosis not present

## 2020-12-24 DIAGNOSIS — D5720 Sickle-cell/Hb-C disease without crisis: Secondary | ICD-10-CM | POA: Diagnosis not present

## 2020-12-24 DIAGNOSIS — N2581 Secondary hyperparathyroidism of renal origin: Secondary | ICD-10-CM | POA: Diagnosis not present

## 2020-12-24 DIAGNOSIS — Z20822 Contact with and (suspected) exposure to covid-19: Secondary | ICD-10-CM | POA: Diagnosis not present

## 2020-12-24 DIAGNOSIS — Z992 Dependence on renal dialysis: Secondary | ICD-10-CM | POA: Diagnosis not present

## 2020-12-24 LAB — SARS CORONAVIRUS 2 (TAT 6-24 HRS): SARS Coronavirus 2: NEGATIVE

## 2020-12-24 NOTE — Progress Notes (Signed)
After multiple attempts, unable to reach patient via phone.  When answered, it states "call can not be completed at this time, please try again later.  PCP/Hematology - Dr Retta Mac Cardiologist - Dr Azzie Glatter Nephrology - Dr Salvatore Decent  Chest x-ray - n/a EKG - 12/04/20 Stress Test - 07/06/19 ECHO - 06/10/20 Cardiac Cath - n/a  DM 2 - diet controlled, no meds  Fasting Blood Sugar - 90-100s from past hx Checks Blood Sugar 1-2 times a day from past hx  Anesthesia review: Yes  STOP now taking any Aspirin (unless otherwise instructed by your surgeon), Aleve, Naproxen, Ibuprofen, Motrin, Advil, Goody's, BC's, all herbal medications, fish oil, and all vitamins.   Coronavirus Screening Covid test is scheduled on 12/24/20.  Results are pending.

## 2020-12-25 ENCOUNTER — Ambulatory Visit (HOSPITAL_COMMUNITY): Payer: Medicare PPO | Admitting: Physician Assistant

## 2020-12-25 ENCOUNTER — Encounter (HOSPITAL_COMMUNITY)
Admission: RE | Disposition: A | Discharge status: Home / Self Care | Payer: Self-pay | Source: Home / Self Care | Attending: Vascular Surgery

## 2020-12-25 ENCOUNTER — Ambulatory Visit (HOSPITAL_COMMUNITY)
Admission: RE | Admit: 2020-12-25 | Discharge: 2020-12-25 | Disposition: A | Discharge status: Home / Self Care | Payer: Medicare PPO | Attending: Vascular Surgery | Admitting: Vascular Surgery

## 2020-12-25 ENCOUNTER — Other Ambulatory Visit: Payer: Self-pay

## 2020-12-25 ENCOUNTER — Encounter (HOSPITAL_COMMUNITY): Payer: Self-pay | Admitting: Vascular Surgery

## 2020-12-25 DIAGNOSIS — N186 End stage renal disease: Secondary | ICD-10-CM | POA: Diagnosis not present

## 2020-12-25 DIAGNOSIS — I120 Hypertensive chronic kidney disease with stage 5 chronic kidney disease or end stage renal disease: Secondary | ICD-10-CM | POA: Insufficient documentation

## 2020-12-25 DIAGNOSIS — Z881 Allergy status to other antibiotic agents status: Secondary | ICD-10-CM | POA: Insufficient documentation

## 2020-12-25 DIAGNOSIS — E1122 Type 2 diabetes mellitus with diabetic chronic kidney disease: Secondary | ICD-10-CM | POA: Insufficient documentation

## 2020-12-25 DIAGNOSIS — Z992 Dependence on renal dialysis: Secondary | ICD-10-CM | POA: Diagnosis not present

## 2020-12-25 DIAGNOSIS — N185 Chronic kidney disease, stage 5: Secondary | ICD-10-CM | POA: Diagnosis not present

## 2020-12-25 DIAGNOSIS — G8918 Other acute postprocedural pain: Secondary | ICD-10-CM | POA: Diagnosis not present

## 2020-12-25 DIAGNOSIS — I160 Hypertensive urgency: Secondary | ICD-10-CM | POA: Diagnosis not present

## 2020-12-25 HISTORY — PX: AV FISTULA PLACEMENT: SHX1204

## 2020-12-25 LAB — POCT I-STAT, CHEM 8
BUN: 30 mg/dL — ABNORMAL HIGH (ref 6–20)
Calcium, Ion: 1.29 mmol/L (ref 1.15–1.40)
Chloride: 98 mmol/L (ref 98–111)
Creatinine, Ser: 8 mg/dL — ABNORMAL HIGH (ref 0.44–1.00)
Glucose, Bld: 81 mg/dL (ref 70–99)
HCT: 36 % (ref 36.0–46.0)
Hemoglobin: 12.2 g/dL (ref 12.0–15.0)
Potassium: 4.8 mmol/L (ref 3.5–5.1)
Sodium: 137 mmol/L (ref 135–145)
TCO2: 33 mmol/L — ABNORMAL HIGH (ref 22–32)

## 2020-12-25 LAB — POCT PREGNANCY, URINE: Preg Test, Ur: NEGATIVE

## 2020-12-25 LAB — GLUCOSE, CAPILLARY
Glucose-Capillary: 83 mg/dL (ref 70–99)
Glucose-Capillary: 73 mg/dL (ref 70–99)
Glucose-Capillary: 92 mg/dL (ref 70–99)

## 2020-12-25 SURGERY — ARTERIOVENOUS (AV) FISTULA CREATION
Anesthesia: Regional | Site: Arm Upper | Laterality: Left

## 2020-12-25 MED ORDER — FENTANYL CITRATE (PF) 250 MCG/5ML IJ SOLN
INTRAMUSCULAR | Status: AC
  Filled 2020-12-25: qty 5

## 2020-12-25 MED ORDER — LIDOCAINE 2% (20 MG/ML) 5 ML SYRINGE
INTRAMUSCULAR | Status: DC | PRN
  Administered 2020-12-25: 100 mg via INTRAVENOUS

## 2020-12-25 MED ORDER — FENTANYL CITRATE (PF) 100 MCG/2ML IJ SOLN
25.0000 ug | INTRAMUSCULAR | Status: DC | PRN
  Administered 2020-12-25 (×4): 25 ug via INTRAVENOUS

## 2020-12-25 MED ORDER — MIDAZOLAM HCL 2 MG/2ML IJ SOLN
INTRAMUSCULAR | Status: AC
  Administered 2020-12-25: 2 mg via INTRAVENOUS
  Filled 2020-12-25: qty 2

## 2020-12-25 MED ORDER — ACETAMINOPHEN 500 MG PO TABS
ORAL_TABLET | ORAL | Status: AC
  Filled 2020-12-25: qty 2

## 2020-12-25 MED ORDER — SODIUM CHLORIDE 0.9 % IV SOLN: INTRAVENOUS | Status: DC

## 2020-12-25 MED ORDER — CARVEDILOL 25 MG PO TABS
25.0000 mg | ORAL_TABLET | Freq: Once | ORAL | Status: AC
  Filled 2020-12-25: qty 1

## 2020-12-25 MED ORDER — FENTANYL CITRATE (PF) 250 MCG/5ML IJ SOLN
INTRAMUSCULAR | Status: DC | PRN
  Administered 2020-12-25: 25 ug via INTRAVENOUS

## 2020-12-25 MED ORDER — DEXAMETHASONE SODIUM PHOSPHATE 10 MG/ML IJ SOLN
INTRAMUSCULAR | Status: DC | PRN
  Administered 2020-12-25: 10 mg via INTRAVENOUS

## 2020-12-25 MED ORDER — DEXAMETHASONE SODIUM PHOSPHATE 10 MG/ML IJ SOLN
INTRAMUSCULAR | Status: AC
  Filled 2020-12-25: qty 1

## 2020-12-25 MED ORDER — PROTAMINE SULFATE 10 MG/ML IV SOLN
INTRAVENOUS | Status: DC | PRN
  Administered 2020-12-25: 30 mg via INTRAVENOUS

## 2020-12-25 MED ORDER — LIDOCAINE 2% (20 MG/ML) 5 ML SYRINGE
INTRAMUSCULAR | Status: AC
  Filled 2020-12-25: qty 5

## 2020-12-25 MED ORDER — ONDANSETRON HCL 4 MG/2ML IJ SOLN
INTRAMUSCULAR | Status: DC | PRN
  Administered 2020-12-25: 4 mg via INTRAVENOUS

## 2020-12-25 MED ORDER — LIDOCAINE-EPINEPHRINE (PF) 1.5 %-1:200000 IJ SOLN
INTRAMUSCULAR | Status: DC | PRN
  Administered 2020-12-25: 30 mL via PERINEURAL

## 2020-12-25 MED ORDER — FENTANYL CITRATE (PF) 100 MCG/2ML IJ SOLN
INTRAMUSCULAR | Status: AC
  Administered 2020-12-25: 50 ug via INTRAVENOUS
  Filled 2020-12-25: qty 2

## 2020-12-25 MED ORDER — SODIUM CHLORIDE 0.9 % IV SOLN
INTRAVENOUS | Status: DC | PRN
  Administered 2020-12-25: 500 mL

## 2020-12-25 MED ORDER — ACETAMINOPHEN 500 MG PO TABS
1000.0000 mg | ORAL_TABLET | Freq: Once | ORAL | Status: AC
  Administered 2020-12-25: 1000 mg via ORAL
  Filled 2020-12-25: qty 2

## 2020-12-25 MED ORDER — SODIUM CHLORIDE 0.9 % IV SOLN
INTRAVENOUS | Status: AC
  Filled 2020-12-25: qty 1.2

## 2020-12-25 MED ORDER — ONDANSETRON HCL 4 MG/2ML IJ SOLN
INTRAMUSCULAR | Status: AC
  Filled 2020-12-25: qty 2

## 2020-12-25 MED ORDER — OXYCODONE-ACETAMINOPHEN 5-325 MG PO TABS: 1.0000 | ORAL_TABLET | ORAL | 0 refills | Status: AC | PRN

## 2020-12-25 MED ORDER — CEFAZOLIN SODIUM-DEXTROSE 2-4 GM/100ML-% IV SOLN
2.0000 g | INTRAVENOUS | Status: AC
  Administered 2020-12-25: 2 g via INTRAVENOUS
  Filled 2020-12-25: qty 100

## 2020-12-25 MED ORDER — 0.9 % SODIUM CHLORIDE (POUR BTL) OPTIME
TOPICAL | Status: DC | PRN
  Administered 2020-12-25: 1000 mL

## 2020-12-25 MED ORDER — CARVEDILOL 12.5 MG PO TABS
ORAL_TABLET | ORAL | Status: AC
  Administered 2020-12-25: 25 mg via ORAL
  Filled 2020-12-25: qty 2

## 2020-12-25 MED ORDER — STERILE WATER FOR IRRIGATION IR SOLN
Status: DC | PRN
  Administered 2020-12-25: 1000 mL

## 2020-12-25 MED ORDER — FENTANYL CITRATE (PF) 100 MCG/2ML IJ SOLN
50.0000 ug | Freq: Once | INTRAMUSCULAR | Status: AC
  Filled 2020-12-25: qty 1

## 2020-12-25 MED ORDER — CHLORHEXIDINE GLUCONATE 4 % EX LIQD
60.0000 mL | Freq: Once | CUTANEOUS | Status: DC
Start: 2020-12-26 — End: 2020-12-25

## 2020-12-25 MED ORDER — HEPARIN SODIUM (PORCINE) 1000 UNIT/ML IJ SOLN
INTRAMUSCULAR | Status: DC | PRN
  Administered 2020-12-25: 5 [IU] via INTRAVENOUS

## 2020-12-25 MED ORDER — MIDAZOLAM HCL 2 MG/2ML IJ SOLN
2.0000 mg | Freq: Once | INTRAMUSCULAR | Status: AC
  Filled 2020-12-25: qty 2

## 2020-12-25 MED ORDER — FENTANYL CITRATE (PF) 100 MCG/2ML IJ SOLN
INTRAMUSCULAR | Status: AC
  Filled 2020-12-25: qty 2

## 2020-12-25 MED ORDER — PROPOFOL 10 MG/ML IV BOLUS
INTRAVENOUS | Status: DC | PRN
  Administered 2020-12-25: 130 mg via INTRAVENOUS

## 2020-12-25 MED ORDER — CHLORHEXIDINE GLUCONATE 4 % EX LIQD: 60.0000 mL | Freq: Once | CUTANEOUS | Status: DC

## 2020-12-25 MED ORDER — CHLORHEXIDINE GLUCONATE 0.12 % MT SOLN
OROMUCOSAL | Status: AC
  Administered 2020-12-25: 15 mL
  Filled 2020-12-25: qty 15

## 2020-12-25 MED ORDER — HEMOSTATIC AGENTS (NO CHARGE) OPTIME
TOPICAL | Status: DC | PRN
  Administered 2020-12-25: 1 via TOPICAL

## 2020-12-25 MED ORDER — ONDANSETRON HCL 4 MG/2ML IJ SOLN: 4.0000 mg | Freq: Once | INTRAMUSCULAR | Status: DC | PRN

## 2020-12-25 SURGICAL SUPPLY — 35 items
ARMBAND PINK RESTRICT EXTREMIT (MISCELLANEOUS) ×3 IMPLANT
CANISTER SUCT 3000ML PPV (MISCELLANEOUS) ×3 IMPLANT
CANNULA VESSEL 3MM 2 BLNT TIP (CANNULA) ×3 IMPLANT
CHLORAPREP W/TINT 26 (MISCELLANEOUS) ×3 IMPLANT
CLIP VESOCCLUDE MED 6/CT (CLIP) ×3 IMPLANT
CLIP VESOCCLUDE SM WIDE 6/CT (CLIP) ×3 IMPLANT
CLOSURE WOUND 1/2 X4 (GAUZE/BANDAGES/DRESSINGS) ×1
COVER PROBE W GEL 5X96 (DRAPES) ×3 IMPLANT
DRAPE EXTREMITY T 121X128X90 (DISPOSABLE) ×3 IMPLANT
DRSG COVADERM 4X6 (GAUZE/BANDAGES/DRESSINGS) ×6 IMPLANT
ELECT REM PT RETURN 9FT ADLT (ELECTROSURGICAL) ×3
ELECTRODE REM PT RTRN 9FT ADLT (ELECTROSURGICAL) ×1 IMPLANT
GLOVE SURG SS PI 8.0 STRL IVOR (GLOVE) ×3 IMPLANT
GOWN STRL REUS W/ TWL LRG LVL3 (GOWN DISPOSABLE) ×2 IMPLANT
GOWN STRL REUS W/ TWL XL LVL3 (GOWN DISPOSABLE) ×1 IMPLANT
GOWN STRL REUS W/TWL LRG LVL3 (GOWN DISPOSABLE) ×4
GOWN STRL REUS W/TWL XL LVL3 (GOWN DISPOSABLE) ×2
GRAFT GORETEX STRT 4-7X45 (Vascular Products) ×3 IMPLANT
HEMOSTAT SNOW SURGICEL 2X4 (HEMOSTASIS) ×3 IMPLANT
INSERT FOGARTY SM (MISCELLANEOUS) IMPLANT
KIT BASIN OR (CUSTOM PROCEDURE TRAY) ×3 IMPLANT
KIT TURNOVER KIT B (KITS) ×3 IMPLANT
NEEDLE 18GX1X1/2 (RX/OR ONLY) (NEEDLE) IMPLANT
NS IRRIG 1000ML POUR BTL (IV SOLUTION) ×3 IMPLANT
PACK CV ACCESS (CUSTOM PROCEDURE TRAY) ×3 IMPLANT
PAD ARMBOARD 7.5X6 YLW CONV (MISCELLANEOUS) ×6 IMPLANT
STRIP CLOSURE SKIN 1/2X4 (GAUZE/BANDAGES/DRESSINGS) ×2 IMPLANT
SUT MNCRL AB 4-0 PS2 18 (SUTURE) ×6 IMPLANT
SUT PROLENE 6 0 BV (SUTURE) ×9 IMPLANT
SUT VIC AB 3-0 SH 27 (SUTURE) ×2
SUT VIC AB 3-0 SH 27X BRD (SUTURE) ×1 IMPLANT
SYR 3ML LL SCALE MARK (SYRINGE) IMPLANT
TOWEL GREEN STERILE (TOWEL DISPOSABLE) ×3 IMPLANT
UNDERPAD 30X36 HEAVY ABSORB (UNDERPADS AND DIAPERS) ×3 IMPLANT
WATER STERILE IRR 1000ML POUR (IV SOLUTION) ×3 IMPLANT

## 2020-12-25 NOTE — Transfer of Care (Signed)
Immediate Anesthesia Transfer of Care Note  Patient: Julie Wells  Procedure(s) Performed: LEFT UPPER ARM ARTERIOVENOUS LOOP GRAFT PLACEMENT (Left Arm Upper)  Patient Location: PACU  Anesthesia Type:General  Level of Consciousness: awake, alert  and oriented  Airway & Oxygen Therapy: Patient Spontanous Breathing  Post-op Assessment: Report given to RN and Post -op Vital signs reviewed and stable  Post vital signs: Reviewed and stable  Last Vitals:  Vitals Value Taken Time  BP 162/78 12/25/20 1145  Temp 36.7 C 12/25/20 1145  Pulse 77 12/25/20 1145  Resp 15 12/25/20 1145  SpO2 99 % 12/25/20 1145  Vitals shown include unvalidated device data.  Last Pain:  Vitals:   12/25/20 0800  TempSrc:   PainSc: 0-No pain         Complications: No complications documented.

## 2020-12-25 NOTE — Anesthesia Procedure Notes (Signed)
Anesthesia Regional Block: Supraclavicular block   Pre-Anesthetic Checklist: ,, timeout performed, Correct Patient, Correct Site, Correct Laterality, Correct Procedure, Correct Position, site marked, Risks and benefits discussed,  Surgical consent,  Pre-op evaluation,  At surgeon's request and post-op pain management  Laterality: Left  Prep: chloraprep       Needles:  Injection technique: Single-shot  Needle Type: Echogenic Needle     Needle Length: 9cm  Needle Gauge: 21     Additional Needles:   Procedures:,,,, ultrasound used (permanent image in chart),,,,  Narrative:  Start time: 12/25/2020 9:33 AM End time: 12/25/2020 9:43 AM Injection made incrementally with aspirations every 5 mL.  Performed by: Personally  Anesthesiologist: Catalina Gravel, MD  Additional Notes: No pain on injection. No increased resistance to injection. Injection made in 5cc increments.  Good needle visualization.  Patient tolerated procedure well.

## 2020-12-25 NOTE — Anesthesia Postprocedure Evaluation (Signed)
Anesthesia Post Note  Patient: PAYSON CRUMBY  Procedure(s) Performed: LEFT UPPER ARM ARTERIOVENOUS LOOP GRAFT PLACEMENT (Left Arm Upper)     Patient location during evaluation: PACU Anesthesia Type: Regional and General Level of consciousness: awake and alert Pain management: pain level controlled Vital Signs Assessment: post-procedure vital signs reviewed and stable Respiratory status: spontaneous breathing, nonlabored ventilation and respiratory function stable Cardiovascular status: blood pressure returned to baseline and stable Postop Assessment: no apparent nausea or vomiting Anesthetic complications: no   No complications documented.  Last Vitals:  Vitals:   12/25/20 1230 12/25/20 1245  BP: (!) 158/85 (!) 164/84  Pulse: 70 69  Resp: 15 11  Temp:    SpO2: 99% 99%    Last Pain:  Vitals:   12/25/20 1215  TempSrc:   PainSc: 7                  Catalina Gravel

## 2020-12-25 NOTE — Op Note (Addendum)
DATE OF SERVICE: 12/25/2020  PATIENT:  Julie Wells  50 y.o. female  PRE-OPERATIVE DIAGNOSIS:  ESRD in need of HD access  POST-OPERATIVE DIAGNOSIS:  Same  PROCEDURE:   Left upper extremity axillary loop arteriovenous graft  SURGEON:  Surgeon(s) and Role:    * [name], MD - Primary  ASSISTANT: Paulo Fruit, PA-C  An assistant was required to facilitate exposure and expedite the case.  ANESTHESIA:   regional and general  EBL: min  BLOOD ADMINISTERED:none  DRAINS: none   LOCAL MEDICATIONS USED:  NONE  SPECIMEN:  none  COUNTS: confirmed correct   TOURNIQUET:  None  PATIENT DISPOSITION:  PACU - hemodynamically stable.   Delay start of Pharmacological VTE agent (>24hrs) due to surgical blood loss or risk of bleeding: no  INDICATION FOR PROCEDURE: [name] is a 50 y.o. female with ESRD in need of HD access. After careful discussion of risks, benefits, and alternatives the patient was offered left upper extremity axillary loop AVG. We specifically discussed risk of steal syndrome. The patient understood and wished to proceed.  OPERATIVE FINDINGS: healthy proximal brachial artery and brachial vein. Arterial anastomosis done laterally. Flow in graft is clockwise. Venous anastomosis done medially. Good thrill in graft. Excellent doppler flow in venous outflow. 1+ radial pulse at completion.  DESCRIPTION OF PROCEDURE: After identification of the patient in the pre-operative holding area, the patient was transferred to the operating room. The patient was positioned supine on the operating room table. Anesthesia was induced. The left arm was prepped and draped in standard fashion. A surgical pause was performed confirming correct patient, procedure, and operative location.  Using intraoperative ultrasound, the course of the left arm vasculature was mapped.  Longitudinal incision was made just distal to the hairbearing area of the axilla to expose the brachial sheath.   Incision was carried down through the subcutaneous tissue until the brachial sheath was encountered.  Brachial sheath was incised sharply.  Contents were skeletonized.  Great care was taken to protect the nerves.  The brachial vein was encircled with Silastic Vesseloops.  Brachial artery was exposed and encircled with Silastic Vesseloops.  A sheath tunneling device was brought onto the field.  A counterincision was made in the lateral arm.  A subcutaneous tunnel was developed to connect the medial and lateral aspect of the incision.  A 4 to 7 mm tapered vascular graft was brought onto the field.  The 4 mm and was delivered laterally in the axillary wound.  The 7 mm and was positioned medially in the wound.  The patient was heparinized with 5000 units of IV heparin.  Brachial artery was clamped proximally and distally.  An anterior arteriotomy was made with 11 blade extended with Potts scissors.  The 4 mm end of the vascular graft was sewn end-to-side to the arteriotomy using continuous running suture of 6-0 Prolene.  The anastomosis was completed and flushed down the graft.  Hemostasis was achieved the anastomosis.  The vein was then clamped proximally distally.  An anterior venotomy was made with 11 blade and extended with Potts scissors.  The 7 mm end of the vascular graft was beveled so that the end-to-side anastomosis laid with no tension.  End-to-side anastomosis was then done using continuous running suture of 6-0 Prolene.  Immediately prior to completion the anastomosis was flushed and de-aired.  The anastomosis was then completed.  Clamps were removed on the vein.  Excellent flow was noted in the graft.  A 1+  left radial pulse was noted.  Excellent Doppler flow was noted in the radial artery.  This barely augmented with compression of the graft.  Excellent Doppler bruit was heard over the central axillary vein.  This was confirmed in the surgical beds.  The wounds were closed in layers using 3-0 Vicryl, 4-0  Monocryl.  Benzoin and Steri-Strips were applied.  Clean bandages were applied.  Upon completion of the case instrument and sharps counts were confirmed correct. The patient was transferred to the PACU in good condition. I was present for all portions of the procedure.  Yevonne Aline. Stanford Breed, MD [facility] Phone Number: [phone] 12/25/2020 11:35 AM

## 2020-12-25 NOTE — Anesthesia Preprocedure Evaluation (Addendum)
Anesthesia Evaluation  Patient identified by MRN, date of birth, ID band Patient awake    Reviewed: Allergy & Precautions, NPO status , Patient's Chart, lab work & pertinent test results, reviewed documented beta blocker date and time   Airway Mallampati: II  TM Distance: >3 FB Neck ROM: Full    Dental  (+) Dental Advisory Given, Missing   Pulmonary neg pulmonary ROS,    Pulmonary exam normal breath sounds clear to auscultation       Cardiovascular hypertension, Pt. on medications and Pt. on home beta blockers Normal cardiovascular exam Rhythm:Regular Rate:Normal     Neuro/Psych  Headaches, CVA, No Residual Symptoms negative psych ROS   GI/Hepatic negative GI ROS, Neg liver ROS,   Endo/Other  diabetes, Type 2  Renal/GU ESRF and DialysisRenal disease     Musculoskeletal negative musculoskeletal ROS (+)   Abdominal   Peds  Hematology  (+) Blood dyscrasia, Sickle cell trait ,   Anesthesia Other Findings   Reproductive/Obstetrics                            Anesthesia Physical Anesthesia Plan  ASA: III  Anesthesia Plan: General   Post-op Pain Management:  Regional for Post-op pain   Induction: Intravenous  PONV Risk Score and Plan: 3 and Midazolam, Dexamethasone and Ondansetron  Airway Management Planned: LMA  Additional Equipment:   Intra-op Plan:   Post-operative Plan: Extubation in OR  Informed Consent: I have reviewed the patients History and Physical, chart, labs and discussed the procedure including the risks, benefits and alternatives for the proposed anesthesia with the patient or authorized representative who has indicated his/her understanding and acceptance.     Dental advisory given  Plan Discussed with: CRNA  Anesthesia Plan Comments:       Anesthesia Quick Evaluation

## 2020-12-25 NOTE — H&P (Signed)
ASSESSMENT & PLAN:  Julie Wells is a 50 y.o. right handed female in need of permanent hemodialysis access. Unfortunately her left cephalic vein was less healthy than anticipated. Her left brachiobasilic fistula, created 12/04/20, thrombosed quickly. She desires left sided access. Will plan for left upper extremity arteriovenous graft today in OR.  CHIEF COMPLAINT:   In need of HD access  HISTORY:  HISTORY OF PRESENT ILLNESS: Julie Wells is a 50 y.o. female with a new diagnosis of end-stage renal disease.  She is right-handed.  She is currently dialyzing through a right internal jugular tunneled dialysis catheter.  She has no other history of central venous catheter.  She is never had a pacemaker. I created a left upper extremity brachiobasilic arteriovenous fistula 12/04/20 which unfortunately thrombosed.   Past Medical History:  Diagnosis Date  . Chronic kidney disease   . Frequent headaches   . Hematuria 06/20/2016  . High cholesterol   . History of blood transfusion    multiple, no reactions  . Hypertension   . Migraine    " I constantly have my migraines" (06/23/2016)  . Pneumonia   . Short-term memory loss    Archie Endo 06/23/2016  . Sickle cell trait ([place])   . Stroke [facility]) 2010   short term memory loss/notes 06/23/2016  . Type 2 diabetes, diet controlled ([place])    no meds, diet controlled    Past Surgical History:  Procedure Laterality Date  . AV FISTULA PLACEMENT Left 12/04/2020   Procedure: LEFT BRACHOBASCILIC  ARTERIOVENOUS (AV) FISTULA CREATION;  Surgeon: [name], MD;  Location: [place];  Service: Vascular;  Laterality: Left;  PERIPHERAL NERVE BLOCK  . CESAREAN SECTION  X 2  . COLONOSCOPY  2021   x 3  . TUBAL LIGATION      History reviewed. No pertinent family history.  Social History   Socioeconomic History  . Marital status: Married    Spouse name: Not on file  . Number of children: Not on file  . Years of education: Not on file  . Highest education  level: Not on file  Occupational History  . Not on file  Tobacco Use  . Smoking status: Never Smoker  . Smokeless tobacco: Never Used  Vaping Use  . Vaping Use: Never used  Substance and Sexual Activity  . Alcohol use: No  . Drug use: No  . Sexual activity: Not Currently    Birth control/protection: Surgical    Comment: Tubal Ligation  Other Topics Concern  . Not on file  Social History Narrative   ** Merged History Encounter **       Social Determinants of Health   Financial Resource Strain: Not on file  Food Insecurity: Not on file  Transportation Needs: Not on file  Physical Activity: Not on file  Stress: Not on file  Social Connections: Not on file  Intimate Partner Violence: Not on file    Allergies  Allergen Reactions  . Ciprofloxacin Swelling    Other reaction(s): swelling  . Levaquin [Levofloxacin In D5w] Hives and Swelling  . Other Swelling    Unnamed med caused swelling of the lips (patient cannot recall the indication or name)    Current Facility-Administered Medications  Medication Dose Route Frequency Provider Last Rate Last Admin  . 0.9 %  sodium chloride infusion   Intravenous Continuous [name], MD      . acetaminophen (TYLENOL) 500 MG tablet           .  ceFAZolin (ANCEF) IVPB 2g/100 mL premix  2 g Intravenous 30 min Pre-Op [name], MD      . chlorhexidine (HIBICLENS) 4 % liquid 4 application  60 mL Topical Once [name], MD       And  . Derrill Memo ON 12/26/2020] chlorhexidine (HIBICLENS) 4 % liquid 4 application  60 mL Topical Once [name], MD      . fentaNYL (SUBLIMAZE) 100 MCG/2ML injection           . midazolam (VERSED) 2 MG/2ML injection             REVIEW OF SYSTEMS:  [X]  denotes positive finding, [ ]  denotes negative finding Cardiac  Comments:  Chest pain or chest pressure:    Shortness of breath upon exertion:    Short of breath when lying flat:    Irregular heart rhythm:        Vascular    Pain in calf,  thigh, or hip brought on by ambulation:    Pain in feet at night that wakes you up from your sleep:     Blood clot in your veins:    Leg swelling:         Pulmonary    Oxygen at home:    Productive cough:     Wheezing:         Neurologic    Sudden weakness in arms or legs:     Sudden numbness in arms or legs:     Sudden onset of difficulty speaking or slurred speech:    Temporary loss of vision in one eye:     Problems with dizziness:         Gastrointestinal    Blood in stool:     Vomited blood:         Genitourinary    Burning when urinating:     Blood in urine:        Psychiatric    Major depression:         Hematologic    Bleeding problems:    Problems with blood clotting too easily:        Skin    Rashes or ulcers:        Constitutional    Fever or chills:     PHYSICAL EXAM:   Vitals:   12/25/20 0747  BP: (!) 195/89  Pulse: 76  Resp: 17  Temp: 98.1 F (36.7 C)  TempSrc: Oral  SpO2: 100%  Weight: 68.5 kg  Height: 5\' 2"  (1.575 m)   Constitutional: well appearing in no distress. Appears well nourished.  Neurologic: CN intact. no focal findings. no sensory loss. Psychiatric: Mood and affect symmetric and appropriate. Eyes: No icterus. No conjunctival pallor. Ears, nose, throat: mucous membranes moist. Midline trachea.  Cardiac: regular rate and rhythm.  Respiratory: unlabored. Abdominal: soft, non-tender, non-distended.  Peripheral vascular:  2+ radial pulses bilaterally Extremity: No edema. No cyanosis. No pallor.  Skin: No gangrene. No ulceration.  Lymphatic: No Stemmer's sign. No palpable lymphadenopathy.   DATA REVIEW:    Most recent CBC CBC Latest Ref Rng & Units 12/25/2020 12/04/2020 06/24/2016  WBC 4.0 - 10.5 K/uL - - 14.0(H)  Hemoglobin 12.0 - 15.0 g/dL 12.2 10.9(L) 9.2(L)  Hematocrit 36.0 - 46.0 % 36.0 32.0(L) 27.6(L)  Platelets 150 - 400 K/uL - - 780(H)     Most recent CMP CMP Latest Ref Rng & Units 12/25/2020 12/04/2020 06/24/2016   Glucose 70 - 99 mg/dL 81 95  76  BUN 6 - 20 mg/dL 30(H) 39(H) 39(H)  Creatinine 0.44 - 1.00 mg/dL 8.00(H) 7.30(H) 3.14(H)  Sodium 135 - 145 mmol/L 137 139 142  Potassium 3.5 - 5.1 mmol/L 4.8 5.0 3.3(L)  Chloride 98 - 111 mmol/L 98 98 108  CO2 22 - 32 mmol/L - - 18(L)  Calcium 8.9 - 10.3 mg/dL - - 9.2  Total Protein 6.5 - 8.1 g/dL - - -  Total Bilirubin 0.3 - 1.2 mg/dL - - -  Alkaline Phos 38 - 126 U/L - - -  AST 15 - 41 U/L - - -  ALT 14 - 54 U/L - - -    Renal function Estimated Creatinine Clearance: 7.6 mL/min (A) (by C-G formula based on SCr of 8 mg/dL (H)).  Hgb A1c MFr Bld (%)  Date Value  06/20/2016 4.5 (L)    No results found for: LDLCALC, LDLC, HIRISKLDL, POCLDL, LDLDIRECT, REALLDLC, TOTLDLC   Vascular Imaging: Upper extremity vein mapping   Yevonne Aline. Stanford Breed, MD [facility] Phone Number: [phone] 12/25/2020 9:36 AM

## 2020-12-25 NOTE — Anesthesia Procedure Notes (Signed)
Procedure Name: LMA Insertion Date/Time: 12/25/2020 10:02 AM Performed by: Dorthea Cove, CRNA Pre-anesthesia Checklist: Patient identified, Emergency Drugs available, Suction available and Patient being monitored Patient Re-evaluated:Patient Re-evaluated prior to induction Oxygen Delivery Method: Circle System Utilized Preoxygenation: Pre-oxygenation with 100% oxygen Induction Type: IV induction Ventilation: Mask ventilation without difficulty LMA: LMA inserted LMA Size: 4.0 Number of attempts: 1 Airway Equipment and Method: Bite block Placement Confirmation: positive ETCO2 Tube secured with: Tape Dental Injury: Teeth and Oropharynx as per pre-operative assessment

## 2020-12-25 NOTE — Discharge Instructions (Signed)
[facility]  Discharge Instructions  AV Fistula or Graft Surgery for Dialysis Access  Please refer to the following instructions for your post-procedure care. Your surgeon or physician assistant will discuss any changes with you.  Activity  You may drive the day following your surgery, if you are comfortable and no longer taking prescription pain medication. Resume full activity as the soreness in your incision resolves.  Bathing/Showering  You may shower after you go home. Keep your incision dry for 48 hours. Do not soak in a bathtub, hot tub, or swim until the incision heals completely. You may not shower if you have a hemodialysis catheter.  Incision Care  Clean your incision with mild soap and water after 48 hours. Pat the area dry with a clean towel. You do not need a bandage unless otherwise instructed. Do not apply any ointments or creams to your incision. You may have skin glue on your incision. Do not peel it off. It will come off on its own in about one week. Your arm may swell a bit after surgery. To reduce swelling use pillows to elevate your arm so it is above your heart. Your doctor will tell you if you need to lightly wrap your arm with an ACE bandage.  Diet  Resume your normal diet. There are not special food restrictions following this procedure. In order to heal from your surgery, it is CRITICAL to get adequate nutrition. Your body requires vitamins, minerals, and protein. Vegetables are the best source of vitamins and minerals. Vegetables also provide the perfect balance of protein. Processed food has little nutritional value, so try to avoid this.  Medications  Resume taking all of your medications. If your incision is causing pain, you may take over-the counter pain relievers such as acetaminophen (Tylenol). If you were prescribed a stronger pain medication, please be aware these medications can cause nausea and constipation. Prevent  nausea by taking the medication with a snack or meal. Avoid constipation by drinking plenty of fluids and eating foods with high amount of fiber, such as fruits, vegetables, and grains.  Do not take Tylenol if you are taking prescription pain medications.  Follow up Your surgeon may want to see you in the office following your access surgery. If so, this will be arranged at the time of your surgery.  Please call us immediately for any of the following conditions:  . Increased pain, redness, drainage (pus) from your incision site . Fever of 101 degrees or higher . Severe or worsening pain at your incision site . Hand pain or numbness. .  Reduce your risk of vascular disease:  . Stop smoking. If you would like help, call [facility] at 1-800-QUIT-NOW 386-644-9521) or [place] at [phone]  . Manage your cholesterol . Maintain a desired weight . Control your diabetes . Keep your blood pressure down  Dialysis  It will take several weeks to several months for your new dialysis access to be ready for use. Your surgeon will determine when it is okay to use it. Your nephrologist will continue to direct your dialysis. You can continue to use your Permcath until your new access is ready for use.   12/25/2020 Julie Wells [id number] [birth year]  Surgeon(s): [name], MD  Procedure(s): LEFT UPPER ARM ARTERIOVENOUS LOOP GRAFT PLACEMENT   May stick graft immediately   May stick graft on designated area only:   X Do not stick left upper arm loop graft for 4  weeks. You will have a follow up appointment before you should start using the graft     If you have any questions, please call the office at [phone].

## 2020-12-26 ENCOUNTER — Encounter (HOSPITAL_COMMUNITY): Payer: Self-pay | Admitting: Vascular Surgery

## 2020-12-27 DIAGNOSIS — Z992 Dependence on renal dialysis: Secondary | ICD-10-CM | POA: Diagnosis not present

## 2020-12-27 DIAGNOSIS — D5720 Sickle-cell/Hb-C disease without crisis: Secondary | ICD-10-CM | POA: Diagnosis not present

## 2020-12-27 DIAGNOSIS — D571 Sickle-cell disease without crisis: Secondary | ICD-10-CM | POA: Diagnosis not present

## 2020-12-27 DIAGNOSIS — N186 End stage renal disease: Secondary | ICD-10-CM | POA: Diagnosis not present

## 2020-12-27 DIAGNOSIS — N2581 Secondary hyperparathyroidism of renal origin: Secondary | ICD-10-CM | POA: Diagnosis not present

## 2020-12-29 DIAGNOSIS — Z992 Dependence on renal dialysis: Secondary | ICD-10-CM | POA: Diagnosis not present

## 2020-12-29 DIAGNOSIS — N2581 Secondary hyperparathyroidism of renal origin: Secondary | ICD-10-CM | POA: Diagnosis not present

## 2020-12-29 DIAGNOSIS — D5720 Sickle-cell/Hb-C disease without crisis: Secondary | ICD-10-CM | POA: Diagnosis not present

## 2020-12-29 DIAGNOSIS — D571 Sickle-cell disease without crisis: Secondary | ICD-10-CM | POA: Diagnosis not present

## 2020-12-29 DIAGNOSIS — N186 End stage renal disease: Secondary | ICD-10-CM | POA: Diagnosis not present

## 2020-12-31 DIAGNOSIS — N186 End stage renal disease: Secondary | ICD-10-CM | POA: Diagnosis not present

## 2020-12-31 DIAGNOSIS — D571 Sickle-cell disease without crisis: Secondary | ICD-10-CM | POA: Diagnosis not present

## 2020-12-31 DIAGNOSIS — D5720 Sickle-cell/Hb-C disease without crisis: Secondary | ICD-10-CM | POA: Diagnosis not present

## 2020-12-31 DIAGNOSIS — N2581 Secondary hyperparathyroidism of renal origin: Secondary | ICD-10-CM | POA: Diagnosis not present

## 2020-12-31 DIAGNOSIS — Z992 Dependence on renal dialysis: Secondary | ICD-10-CM | POA: Diagnosis not present

## 2021-01-02 DIAGNOSIS — D5720 Sickle-cell/Hb-C disease without crisis: Secondary | ICD-10-CM | POA: Diagnosis not present

## 2021-01-02 DIAGNOSIS — N2581 Secondary hyperparathyroidism of renal origin: Secondary | ICD-10-CM | POA: Diagnosis not present

## 2021-01-02 DIAGNOSIS — N186 End stage renal disease: Secondary | ICD-10-CM | POA: Diagnosis not present

## 2021-01-02 DIAGNOSIS — D571 Sickle-cell disease without crisis: Secondary | ICD-10-CM | POA: Diagnosis not present

## 2021-01-02 DIAGNOSIS — Z992 Dependence on renal dialysis: Secondary | ICD-10-CM | POA: Diagnosis not present

## 2021-01-06 DIAGNOSIS — Z7682 Awaiting organ transplant status: Secondary | ICD-10-CM | POA: Diagnosis not present

## 2021-01-06 DIAGNOSIS — N186 End stage renal disease: Secondary | ICD-10-CM | POA: Diagnosis not present

## 2021-01-06 DIAGNOSIS — N2581 Secondary hyperparathyroidism of renal origin: Secondary | ICD-10-CM | POA: Diagnosis not present

## 2021-01-06 DIAGNOSIS — Z992 Dependence on renal dialysis: Secondary | ICD-10-CM | POA: Diagnosis not present

## 2021-01-06 DIAGNOSIS — D5720 Sickle-cell/Hb-C disease without crisis: Secondary | ICD-10-CM | POA: Diagnosis not present

## 2021-01-06 DIAGNOSIS — D571 Sickle-cell disease without crisis: Secondary | ICD-10-CM | POA: Diagnosis not present

## 2021-01-07 ENCOUNTER — Encounter (HOSPITAL_COMMUNITY): Payer: Medicare PPO

## 2021-01-07 DIAGNOSIS — D571 Sickle-cell disease without crisis: Secondary | ICD-10-CM | POA: Diagnosis not present

## 2021-01-07 DIAGNOSIS — N186 End stage renal disease: Secondary | ICD-10-CM | POA: Diagnosis not present

## 2021-01-07 DIAGNOSIS — D5720 Sickle-cell/Hb-C disease without crisis: Secondary | ICD-10-CM | POA: Diagnosis not present

## 2021-01-07 DIAGNOSIS — N2581 Secondary hyperparathyroidism of renal origin: Secondary | ICD-10-CM | POA: Diagnosis not present

## 2021-01-07 DIAGNOSIS — Z992 Dependence on renal dialysis: Secondary | ICD-10-CM | POA: Diagnosis not present

## 2021-01-09 DIAGNOSIS — Z992 Dependence on renal dialysis: Secondary | ICD-10-CM | POA: Diagnosis not present

## 2021-01-09 DIAGNOSIS — D571 Sickle-cell disease without crisis: Secondary | ICD-10-CM | POA: Diagnosis not present

## 2021-01-09 DIAGNOSIS — N2581 Secondary hyperparathyroidism of renal origin: Secondary | ICD-10-CM | POA: Diagnosis not present

## 2021-01-09 DIAGNOSIS — D5720 Sickle-cell/Hb-C disease without crisis: Secondary | ICD-10-CM | POA: Diagnosis not present

## 2021-01-09 DIAGNOSIS — N186 End stage renal disease: Secondary | ICD-10-CM | POA: Diagnosis not present

## 2021-01-12 DIAGNOSIS — D5720 Sickle-cell/Hb-C disease without crisis: Secondary | ICD-10-CM | POA: Diagnosis not present

## 2021-01-12 DIAGNOSIS — N2581 Secondary hyperparathyroidism of renal origin: Secondary | ICD-10-CM | POA: Diagnosis not present

## 2021-01-12 DIAGNOSIS — Z992 Dependence on renal dialysis: Secondary | ICD-10-CM | POA: Diagnosis not present

## 2021-01-12 DIAGNOSIS — N186 End stage renal disease: Secondary | ICD-10-CM | POA: Diagnosis not present

## 2021-01-12 DIAGNOSIS — D571 Sickle-cell disease without crisis: Secondary | ICD-10-CM | POA: Diagnosis not present

## 2021-01-14 DIAGNOSIS — N186 End stage renal disease: Secondary | ICD-10-CM | POA: Diagnosis not present

## 2021-01-14 DIAGNOSIS — Z992 Dependence on renal dialysis: Secondary | ICD-10-CM | POA: Diagnosis not present

## 2021-01-14 DIAGNOSIS — N2581 Secondary hyperparathyroidism of renal origin: Secondary | ICD-10-CM | POA: Diagnosis not present

## 2021-01-14 DIAGNOSIS — D5720 Sickle-cell/Hb-C disease without crisis: Secondary | ICD-10-CM | POA: Diagnosis not present

## 2021-01-14 DIAGNOSIS — D571 Sickle-cell disease without crisis: Secondary | ICD-10-CM | POA: Diagnosis not present

## 2021-01-17 DIAGNOSIS — D5720 Sickle-cell/Hb-C disease without crisis: Secondary | ICD-10-CM | POA: Diagnosis not present

## 2021-01-17 DIAGNOSIS — D571 Sickle-cell disease without crisis: Secondary | ICD-10-CM | POA: Diagnosis not present

## 2021-01-17 DIAGNOSIS — Z992 Dependence on renal dialysis: Secondary | ICD-10-CM | POA: Diagnosis not present

## 2021-01-17 DIAGNOSIS — N2581 Secondary hyperparathyroidism of renal origin: Secondary | ICD-10-CM | POA: Diagnosis not present

## 2021-01-17 DIAGNOSIS — N186 End stage renal disease: Secondary | ICD-10-CM | POA: Diagnosis not present

## 2021-01-19 DIAGNOSIS — Z992 Dependence on renal dialysis: Secondary | ICD-10-CM | POA: Diagnosis not present

## 2021-01-19 DIAGNOSIS — D5720 Sickle-cell/Hb-C disease without crisis: Secondary | ICD-10-CM | POA: Diagnosis not present

## 2021-01-19 DIAGNOSIS — N2581 Secondary hyperparathyroidism of renal origin: Secondary | ICD-10-CM | POA: Diagnosis not present

## 2021-01-19 DIAGNOSIS — D571 Sickle-cell disease without crisis: Secondary | ICD-10-CM | POA: Diagnosis not present

## 2021-01-19 DIAGNOSIS — N186 End stage renal disease: Secondary | ICD-10-CM | POA: Diagnosis not present

## 2021-01-21 DIAGNOSIS — Z992 Dependence on renal dialysis: Secondary | ICD-10-CM | POA: Diagnosis not present

## 2021-01-21 DIAGNOSIS — D571 Sickle-cell disease without crisis: Secondary | ICD-10-CM | POA: Diagnosis not present

## 2021-01-21 DIAGNOSIS — N2581 Secondary hyperparathyroidism of renal origin: Secondary | ICD-10-CM | POA: Diagnosis not present

## 2021-01-21 DIAGNOSIS — N186 End stage renal disease: Secondary | ICD-10-CM | POA: Diagnosis not present

## 2021-01-21 DIAGNOSIS — D5720 Sickle-cell/Hb-C disease without crisis: Secondary | ICD-10-CM | POA: Diagnosis not present

## 2021-01-23 DIAGNOSIS — N2581 Secondary hyperparathyroidism of renal origin: Secondary | ICD-10-CM | POA: Diagnosis not present

## 2021-01-23 DIAGNOSIS — N186 End stage renal disease: Secondary | ICD-10-CM | POA: Diagnosis not present

## 2021-01-23 DIAGNOSIS — D5720 Sickle-cell/Hb-C disease without crisis: Secondary | ICD-10-CM | POA: Diagnosis not present

## 2021-01-23 DIAGNOSIS — D571 Sickle-cell disease without crisis: Secondary | ICD-10-CM | POA: Diagnosis not present

## 2021-01-23 DIAGNOSIS — Z992 Dependence on renal dialysis: Secondary | ICD-10-CM | POA: Diagnosis not present

## 2021-01-27 ENCOUNTER — Ambulatory Visit (HOSPITAL_COMMUNITY): Payer: Medicare PPO

## 2021-01-27 DIAGNOSIS — D5720 Sickle-cell/Hb-C disease without crisis: Secondary | ICD-10-CM | POA: Diagnosis not present

## 2021-01-27 DIAGNOSIS — D571 Sickle-cell disease without crisis: Secondary | ICD-10-CM | POA: Diagnosis not present

## 2021-01-27 DIAGNOSIS — Z992 Dependence on renal dialysis: Secondary | ICD-10-CM | POA: Diagnosis not present

## 2021-01-27 DIAGNOSIS — N186 End stage renal disease: Secondary | ICD-10-CM | POA: Diagnosis not present

## 2021-01-27 DIAGNOSIS — N2581 Secondary hyperparathyroidism of renal origin: Secondary | ICD-10-CM | POA: Diagnosis not present

## 2021-01-28 DIAGNOSIS — N2581 Secondary hyperparathyroidism of renal origin: Secondary | ICD-10-CM | POA: Diagnosis not present

## 2021-01-28 DIAGNOSIS — Z992 Dependence on renal dialysis: Secondary | ICD-10-CM | POA: Diagnosis not present

## 2021-01-28 DIAGNOSIS — D5720 Sickle-cell/Hb-C disease without crisis: Secondary | ICD-10-CM | POA: Diagnosis not present

## 2021-01-28 DIAGNOSIS — N186 End stage renal disease: Secondary | ICD-10-CM | POA: Diagnosis not present

## 2021-01-28 DIAGNOSIS — D571 Sickle-cell disease without crisis: Secondary | ICD-10-CM | POA: Diagnosis not present

## 2021-01-29 DIAGNOSIS — I10 Essential (primary) hypertension: Secondary | ICD-10-CM | POA: Diagnosis not present

## 2021-01-29 DIAGNOSIS — N186 End stage renal disease: Secondary | ICD-10-CM | POA: Diagnosis not present

## 2021-01-29 DIAGNOSIS — Z992 Dependence on renal dialysis: Secondary | ICD-10-CM | POA: Diagnosis not present

## 2021-01-29 DIAGNOSIS — Z23 Encounter for immunization: Secondary | ICD-10-CM | POA: Diagnosis not present

## 2021-01-30 DIAGNOSIS — D571 Sickle-cell disease without crisis: Secondary | ICD-10-CM | POA: Diagnosis not present

## 2021-01-30 DIAGNOSIS — D5720 Sickle-cell/Hb-C disease without crisis: Secondary | ICD-10-CM | POA: Diagnosis not present

## 2021-01-30 DIAGNOSIS — N186 End stage renal disease: Secondary | ICD-10-CM | POA: Diagnosis not present

## 2021-01-30 DIAGNOSIS — N2581 Secondary hyperparathyroidism of renal origin: Secondary | ICD-10-CM | POA: Diagnosis not present

## 2021-01-30 DIAGNOSIS — Z992 Dependence on renal dialysis: Secondary | ICD-10-CM | POA: Diagnosis not present

## 2021-02-02 DIAGNOSIS — N186 End stage renal disease: Secondary | ICD-10-CM | POA: Diagnosis not present

## 2021-02-02 DIAGNOSIS — N2581 Secondary hyperparathyroidism of renal origin: Secondary | ICD-10-CM | POA: Diagnosis not present

## 2021-02-02 DIAGNOSIS — D5720 Sickle-cell/Hb-C disease without crisis: Secondary | ICD-10-CM | POA: Diagnosis not present

## 2021-02-02 DIAGNOSIS — Z992 Dependence on renal dialysis: Secondary | ICD-10-CM | POA: Diagnosis not present

## 2021-02-02 DIAGNOSIS — D571 Sickle-cell disease without crisis: Secondary | ICD-10-CM | POA: Diagnosis not present

## 2021-02-03 DIAGNOSIS — G43909 Migraine, unspecified, not intractable, without status migrainosus: Secondary | ICD-10-CM | POA: Insufficient documentation

## 2021-02-03 DIAGNOSIS — E785 Hyperlipidemia, unspecified: Secondary | ICD-10-CM | POA: Insufficient documentation

## 2021-02-03 DIAGNOSIS — G894 Chronic pain syndrome: Secondary | ICD-10-CM | POA: Insufficient documentation

## 2021-02-04 DIAGNOSIS — D571 Sickle-cell disease without crisis: Secondary | ICD-10-CM | POA: Diagnosis not present

## 2021-02-04 DIAGNOSIS — Z992 Dependence on renal dialysis: Secondary | ICD-10-CM | POA: Diagnosis not present

## 2021-02-04 DIAGNOSIS — N2581 Secondary hyperparathyroidism of renal origin: Secondary | ICD-10-CM | POA: Diagnosis not present

## 2021-02-04 DIAGNOSIS — D5720 Sickle-cell/Hb-C disease without crisis: Secondary | ICD-10-CM | POA: Diagnosis not present

## 2021-02-04 DIAGNOSIS — N186 End stage renal disease: Secondary | ICD-10-CM | POA: Diagnosis not present

## 2021-02-04 NOTE — Progress Notes (Signed)
POST OPERATIVE OFFICE NOTE    CC:  F/u for surgery  HPI:  This is a 50 y.o. female who is s/p LUE loop graft on 12/25/2020 by Dr. Stanford Breed.  She originally had a 1st stage left BVT that failed.    Pt states she does not have pain/numbness in the left hand.   She is currently on HD and her graft is being used and is working well.   The pt is on dialysis  at [facility] location.   Allergies  Allergen Reactions  . Ciprofloxacin Swelling    Other reaction(s): swelling  . Levaquin [Levofloxacin In D5w] Hives and Swelling  . Other Swelling    Unnamed med caused swelling of the lips (patient cannot recall the indication or name)    Current Outpatient Medications  Medication Sig Dispense Refill  . acetaminophen (TYLENOL) 500 MG tablet Take 500-1,000 mg by mouth every 6 (six) hours as needed for mild pain or headache.     . albuterol (VENTOLIN HFA) 108 (90 Base) MCG/ACT inhaler Inhale 2 puffs into the lungs every 6 (six) hours as needed for wheezing or shortness of breath.    Marland Kitchen atorvastatin (LIPITOR) 40 MG tablet Take 40 mg by mouth daily.    . B Complex-C-Folic Acid (DIALYVITE TABLET) TABS Take 1 tablet by mouth daily.    . carvedilol (COREG) 25 MG tablet Take 25 mg by mouth 2 (two) times daily with a meal.    . cinacalcet (SENSIPAR) 60 MG tablet Take 60 mg by mouth daily.    . hydroxyurea (HYDREA) 500 MG capsule Take 500 mg by mouth daily. May take with food to minimize GI side effects.    Marland Kitchen NIFEdipine (ADALAT CC) 30 MG 24 hr tablet Take 30 mg by mouth daily.    Marland Kitchen oxyCODONE-acetaminophen (PERCOCET) 5-325 MG tablet Take 1 tablet by mouth every 4 (four) hours as needed for severe pain. 20 tablet 0  . sevelamer carbonate (RENVELA) 800 MG tablet Take 1,600 mg by mouth 3 (three) times daily with meals.     No current facility-administered medications for this visit.     ROS:  See HPI  Physical Exam:  Today's Vitals   02/05/21 0835  BP: (!) 162/92  Pulse: 82  Resp: 20  Temp: 98.3 F  (36.8 C)  TempSrc: Temporal  SpO2: 100%  Weight: 152 lb 14.4 oz (69.4 kg)  Height: 5\' 2"  (1.575 m)   Body mass index is 27.97 kg/m.   Incision:  Healed nicely Extremities:   There is a palpable left radial pulse.   Motor and sensory are in tact.   There is a thrill/bruit present.  The graft is easily palpable   Dialysis Duplex on 02/04/2021: Patent LUE loop graft   Assessment/Plan:  This is a 51 y.o. female who is s/p: LUE loop graft on 12/25/2020 by Dr. Stanford Breed    -the pt does not have evidence of steal. -the graft is currently being used without issues -discussed with pt that graft will not last forever and will need maintenance or further access in the future   -she does have a TDC that was placed at [facility]-discussed with she and her husband that if they have f/u at [facility], they can schedule to remove this.  Is this is an issue, discussed that we can take this out at the hospital.  They will call if this is their decsion.   -the pt will follow up prn   Leontine Locket, [facility] [phone]  Clinic MD:  Scot Dock

## 2021-02-05 ENCOUNTER — Ambulatory Visit (INDEPENDENT_AMBULATORY_CARE_PROVIDER_SITE_OTHER): Payer: Medicare PPO | Admitting: Physician Assistant

## 2021-02-05 ENCOUNTER — Ambulatory Visit (HOSPITAL_COMMUNITY)
Admission: RE | Admit: 2021-02-05 | Discharge: 2021-02-05 | Disposition: A | Discharge status: Home / Self Care | Payer: Medicare PPO | Source: Ambulatory Visit | Attending: Vascular Surgery | Admitting: Vascular Surgery

## 2021-02-05 ENCOUNTER — Other Ambulatory Visit: Payer: Self-pay

## 2021-02-05 VITALS — BP 162/92 | HR 82 | Temp 98.3°F | Resp 20 | Ht 62.0 in | Wt 152.9 lb

## 2021-02-05 DIAGNOSIS — N186 End stage renal disease: Secondary | ICD-10-CM | POA: Insufficient documentation

## 2021-02-05 DIAGNOSIS — Z992 Dependence on renal dialysis: Secondary | ICD-10-CM | POA: Diagnosis not present

## 2021-02-06 DIAGNOSIS — Z992 Dependence on renal dialysis: Secondary | ICD-10-CM | POA: Diagnosis not present

## 2021-02-06 DIAGNOSIS — D5720 Sickle-cell/Hb-C disease without crisis: Secondary | ICD-10-CM | POA: Diagnosis not present

## 2021-02-06 DIAGNOSIS — N2581 Secondary hyperparathyroidism of renal origin: Secondary | ICD-10-CM | POA: Diagnosis not present

## 2021-02-06 DIAGNOSIS — N186 End stage renal disease: Secondary | ICD-10-CM | POA: Diagnosis not present

## 2021-02-06 DIAGNOSIS — D571 Sickle-cell disease without crisis: Secondary | ICD-10-CM | POA: Diagnosis not present

## 2021-02-09 DIAGNOSIS — N186 End stage renal disease: Secondary | ICD-10-CM | POA: Diagnosis not present

## 2021-02-09 DIAGNOSIS — Z992 Dependence on renal dialysis: Secondary | ICD-10-CM | POA: Diagnosis not present

## 2021-02-09 DIAGNOSIS — D571 Sickle-cell disease without crisis: Secondary | ICD-10-CM | POA: Diagnosis not present

## 2021-02-09 DIAGNOSIS — D5720 Sickle-cell/Hb-C disease without crisis: Secondary | ICD-10-CM | POA: Diagnosis not present

## 2021-02-09 DIAGNOSIS — N2581 Secondary hyperparathyroidism of renal origin: Secondary | ICD-10-CM | POA: Diagnosis not present

## 2021-02-10 DIAGNOSIS — Z992 Dependence on renal dialysis: Secondary | ICD-10-CM | POA: Diagnosis not present

## 2021-02-10 DIAGNOSIS — D571 Sickle-cell disease without crisis: Secondary | ICD-10-CM | POA: Diagnosis not present

## 2021-02-10 DIAGNOSIS — N186 End stage renal disease: Secondary | ICD-10-CM | POA: Diagnosis not present

## 2021-02-10 DIAGNOSIS — D5720 Sickle-cell/Hb-C disease without crisis: Secondary | ICD-10-CM | POA: Diagnosis not present

## 2021-02-10 DIAGNOSIS — N2581 Secondary hyperparathyroidism of renal origin: Secondary | ICD-10-CM | POA: Diagnosis not present

## 2021-02-11 DIAGNOSIS — Z452 Encounter for adjustment and management of vascular access device: Secondary | ICD-10-CM | POA: Diagnosis not present

## 2021-02-11 DIAGNOSIS — N186 End stage renal disease: Secondary | ICD-10-CM | POA: Diagnosis not present

## 2021-02-11 DIAGNOSIS — Z992 Dependence on renal dialysis: Secondary | ICD-10-CM | POA: Diagnosis not present

## 2021-02-13 DIAGNOSIS — N2581 Secondary hyperparathyroidism of renal origin: Secondary | ICD-10-CM | POA: Diagnosis not present

## 2021-02-13 DIAGNOSIS — D571 Sickle-cell disease without crisis: Secondary | ICD-10-CM | POA: Diagnosis not present

## 2021-02-13 DIAGNOSIS — N186 End stage renal disease: Secondary | ICD-10-CM | POA: Diagnosis not present

## 2021-02-13 DIAGNOSIS — Z992 Dependence on renal dialysis: Secondary | ICD-10-CM | POA: Diagnosis not present

## 2021-02-13 DIAGNOSIS — D5720 Sickle-cell/Hb-C disease without crisis: Secondary | ICD-10-CM | POA: Diagnosis not present

## 2021-02-16 DIAGNOSIS — D571 Sickle-cell disease without crisis: Secondary | ICD-10-CM | POA: Diagnosis not present

## 2021-02-16 DIAGNOSIS — N186 End stage renal disease: Secondary | ICD-10-CM | POA: Diagnosis not present

## 2021-02-16 DIAGNOSIS — N2581 Secondary hyperparathyroidism of renal origin: Secondary | ICD-10-CM | POA: Diagnosis not present

## 2021-02-16 DIAGNOSIS — Z992 Dependence on renal dialysis: Secondary | ICD-10-CM | POA: Diagnosis not present

## 2021-02-16 DIAGNOSIS — D5720 Sickle-cell/Hb-C disease without crisis: Secondary | ICD-10-CM | POA: Diagnosis not present

## 2021-02-17 DIAGNOSIS — D571 Sickle-cell disease without crisis: Secondary | ICD-10-CM | POA: Diagnosis not present

## 2021-02-17 DIAGNOSIS — N186 End stage renal disease: Secondary | ICD-10-CM | POA: Diagnosis not present

## 2021-02-17 DIAGNOSIS — Z992 Dependence on renal dialysis: Secondary | ICD-10-CM | POA: Diagnosis not present

## 2021-02-19 DIAGNOSIS — N186 End stage renal disease: Secondary | ICD-10-CM | POA: Diagnosis not present

## 2021-02-19 DIAGNOSIS — Z992 Dependence on renal dialysis: Secondary | ICD-10-CM | POA: Diagnosis not present

## 2021-02-19 DIAGNOSIS — N2581 Secondary hyperparathyroidism of renal origin: Secondary | ICD-10-CM | POA: Diagnosis not present

## 2021-02-19 DIAGNOSIS — D5720 Sickle-cell/Hb-C disease without crisis: Secondary | ICD-10-CM | POA: Diagnosis not present

## 2021-02-19 DIAGNOSIS — D571 Sickle-cell disease without crisis: Secondary | ICD-10-CM | POA: Diagnosis not present

## 2021-02-20 DIAGNOSIS — Z992 Dependence on renal dialysis: Secondary | ICD-10-CM | POA: Diagnosis not present

## 2021-02-20 DIAGNOSIS — N2581 Secondary hyperparathyroidism of renal origin: Secondary | ICD-10-CM | POA: Diagnosis not present

## 2021-02-20 DIAGNOSIS — D5720 Sickle-cell/Hb-C disease without crisis: Secondary | ICD-10-CM | POA: Diagnosis not present

## 2021-02-20 DIAGNOSIS — D571 Sickle-cell disease without crisis: Secondary | ICD-10-CM | POA: Diagnosis not present

## 2021-02-20 DIAGNOSIS — N186 End stage renal disease: Secondary | ICD-10-CM | POA: Diagnosis not present

## 2021-02-23 DIAGNOSIS — N186 End stage renal disease: Secondary | ICD-10-CM | POA: Diagnosis not present

## 2021-02-23 DIAGNOSIS — N2581 Secondary hyperparathyroidism of renal origin: Secondary | ICD-10-CM | POA: Diagnosis not present

## 2021-02-23 DIAGNOSIS — Z992 Dependence on renal dialysis: Secondary | ICD-10-CM | POA: Diagnosis not present

## 2021-02-23 DIAGNOSIS — D571 Sickle-cell disease without crisis: Secondary | ICD-10-CM | POA: Diagnosis not present

## 2021-02-23 DIAGNOSIS — D5720 Sickle-cell/Hb-C disease without crisis: Secondary | ICD-10-CM | POA: Diagnosis not present

## 2021-02-25 DIAGNOSIS — N186 End stage renal disease: Secondary | ICD-10-CM | POA: Diagnosis not present

## 2021-02-25 DIAGNOSIS — D571 Sickle-cell disease without crisis: Secondary | ICD-10-CM | POA: Diagnosis not present

## 2021-02-25 DIAGNOSIS — N2581 Secondary hyperparathyroidism of renal origin: Secondary | ICD-10-CM | POA: Diagnosis not present

## 2021-02-25 DIAGNOSIS — Z992 Dependence on renal dialysis: Secondary | ICD-10-CM | POA: Diagnosis not present

## 2021-02-25 DIAGNOSIS — D5720 Sickle-cell/Hb-C disease without crisis: Secondary | ICD-10-CM | POA: Diagnosis not present

## 2021-03-02 DIAGNOSIS — D5720 Sickle-cell/Hb-C disease without crisis: Secondary | ICD-10-CM | POA: Diagnosis not present

## 2021-03-02 DIAGNOSIS — D571 Sickle-cell disease without crisis: Secondary | ICD-10-CM | POA: Diagnosis not present

## 2021-03-02 DIAGNOSIS — Z992 Dependence on renal dialysis: Secondary | ICD-10-CM | POA: Diagnosis not present

## 2021-03-02 DIAGNOSIS — N186 End stage renal disease: Secondary | ICD-10-CM | POA: Diagnosis not present

## 2021-03-02 DIAGNOSIS — N2581 Secondary hyperparathyroidism of renal origin: Secondary | ICD-10-CM | POA: Diagnosis not present

## 2021-03-04 DIAGNOSIS — N2581 Secondary hyperparathyroidism of renal origin: Secondary | ICD-10-CM | POA: Diagnosis not present

## 2021-03-04 DIAGNOSIS — Z992 Dependence on renal dialysis: Secondary | ICD-10-CM | POA: Diagnosis not present

## 2021-03-04 DIAGNOSIS — D571 Sickle-cell disease without crisis: Secondary | ICD-10-CM | POA: Diagnosis not present

## 2021-03-04 DIAGNOSIS — N186 End stage renal disease: Secondary | ICD-10-CM | POA: Diagnosis not present

## 2021-03-04 DIAGNOSIS — D5720 Sickle-cell/Hb-C disease without crisis: Secondary | ICD-10-CM | POA: Diagnosis not present

## 2021-03-06 DIAGNOSIS — N2581 Secondary hyperparathyroidism of renal origin: Secondary | ICD-10-CM | POA: Diagnosis not present

## 2021-03-06 DIAGNOSIS — D571 Sickle-cell disease without crisis: Secondary | ICD-10-CM | POA: Diagnosis not present

## 2021-03-06 DIAGNOSIS — Z992 Dependence on renal dialysis: Secondary | ICD-10-CM | POA: Diagnosis not present

## 2021-03-06 DIAGNOSIS — D5720 Sickle-cell/Hb-C disease without crisis: Secondary | ICD-10-CM | POA: Diagnosis not present

## 2021-03-06 DIAGNOSIS — N186 End stage renal disease: Secondary | ICD-10-CM | POA: Diagnosis not present

## 2021-03-09 DIAGNOSIS — N2581 Secondary hyperparathyroidism of renal origin: Secondary | ICD-10-CM | POA: Diagnosis not present

## 2021-03-09 DIAGNOSIS — N186 End stage renal disease: Secondary | ICD-10-CM | POA: Diagnosis not present

## 2021-03-09 DIAGNOSIS — Z992 Dependence on renal dialysis: Secondary | ICD-10-CM | POA: Diagnosis not present

## 2021-03-09 DIAGNOSIS — D571 Sickle-cell disease without crisis: Secondary | ICD-10-CM | POA: Diagnosis not present

## 2021-03-09 DIAGNOSIS — D5720 Sickle-cell/Hb-C disease without crisis: Secondary | ICD-10-CM | POA: Diagnosis not present

## 2021-03-11 DIAGNOSIS — D5720 Sickle-cell/Hb-C disease without crisis: Secondary | ICD-10-CM | POA: Diagnosis not present

## 2021-03-11 DIAGNOSIS — N186 End stage renal disease: Secondary | ICD-10-CM | POA: Diagnosis not present

## 2021-03-11 DIAGNOSIS — N2581 Secondary hyperparathyroidism of renal origin: Secondary | ICD-10-CM | POA: Diagnosis not present

## 2021-03-11 DIAGNOSIS — D571 Sickle-cell disease without crisis: Secondary | ICD-10-CM | POA: Diagnosis not present

## 2021-03-11 DIAGNOSIS — Z992 Dependence on renal dialysis: Secondary | ICD-10-CM | POA: Diagnosis not present

## 2021-03-13 DIAGNOSIS — N186 End stage renal disease: Secondary | ICD-10-CM | POA: Diagnosis not present

## 2021-03-13 DIAGNOSIS — D571 Sickle-cell disease without crisis: Secondary | ICD-10-CM | POA: Diagnosis not present

## 2021-03-13 DIAGNOSIS — D5720 Sickle-cell/Hb-C disease without crisis: Secondary | ICD-10-CM | POA: Diagnosis not present

## 2021-03-13 DIAGNOSIS — N2581 Secondary hyperparathyroidism of renal origin: Secondary | ICD-10-CM | POA: Diagnosis not present

## 2021-03-13 DIAGNOSIS — Z992 Dependence on renal dialysis: Secondary | ICD-10-CM | POA: Diagnosis not present

## 2021-03-17 DIAGNOSIS — N186 End stage renal disease: Secondary | ICD-10-CM | POA: Diagnosis not present

## 2021-03-17 DIAGNOSIS — D571 Sickle-cell disease without crisis: Secondary | ICD-10-CM | POA: Diagnosis not present

## 2021-03-17 DIAGNOSIS — N2581 Secondary hyperparathyroidism of renal origin: Secondary | ICD-10-CM | POA: Diagnosis not present

## 2021-03-17 DIAGNOSIS — Z992 Dependence on renal dialysis: Secondary | ICD-10-CM | POA: Diagnosis not present

## 2021-03-17 DIAGNOSIS — D5720 Sickle-cell/Hb-C disease without crisis: Secondary | ICD-10-CM | POA: Diagnosis not present

## 2021-03-18 DIAGNOSIS — D5720 Sickle-cell/Hb-C disease without crisis: Secondary | ICD-10-CM | POA: Diagnosis not present

## 2021-03-18 DIAGNOSIS — N186 End stage renal disease: Secondary | ICD-10-CM | POA: Diagnosis not present

## 2021-03-18 DIAGNOSIS — N2581 Secondary hyperparathyroidism of renal origin: Secondary | ICD-10-CM | POA: Diagnosis not present

## 2021-03-18 DIAGNOSIS — D571 Sickle-cell disease without crisis: Secondary | ICD-10-CM | POA: Diagnosis not present

## 2021-03-18 DIAGNOSIS — Z992 Dependence on renal dialysis: Secondary | ICD-10-CM | POA: Diagnosis not present

## 2021-03-19 DIAGNOSIS — Z992 Dependence on renal dialysis: Secondary | ICD-10-CM | POA: Diagnosis not present

## 2021-03-19 DIAGNOSIS — D571 Sickle-cell disease without crisis: Secondary | ICD-10-CM | POA: Diagnosis not present

## 2021-03-19 DIAGNOSIS — N186 End stage renal disease: Secondary | ICD-10-CM | POA: Diagnosis not present

## 2021-03-20 DIAGNOSIS — D5720 Sickle-cell/Hb-C disease without crisis: Secondary | ICD-10-CM | POA: Diagnosis not present

## 2021-03-20 DIAGNOSIS — N186 End stage renal disease: Secondary | ICD-10-CM | POA: Diagnosis not present

## 2021-03-20 DIAGNOSIS — N2581 Secondary hyperparathyroidism of renal origin: Secondary | ICD-10-CM | POA: Diagnosis not present

## 2021-03-20 DIAGNOSIS — D571 Sickle-cell disease without crisis: Secondary | ICD-10-CM | POA: Diagnosis not present

## 2021-03-20 DIAGNOSIS — Z992 Dependence on renal dialysis: Secondary | ICD-10-CM | POA: Diagnosis not present

## 2021-03-23 DIAGNOSIS — N2581 Secondary hyperparathyroidism of renal origin: Secondary | ICD-10-CM | POA: Diagnosis not present

## 2021-03-23 DIAGNOSIS — Z992 Dependence on renal dialysis: Secondary | ICD-10-CM | POA: Diagnosis not present

## 2021-03-23 DIAGNOSIS — N186 End stage renal disease: Secondary | ICD-10-CM | POA: Diagnosis not present

## 2021-03-23 DIAGNOSIS — D571 Sickle-cell disease without crisis: Secondary | ICD-10-CM | POA: Diagnosis not present

## 2021-03-23 DIAGNOSIS — D5720 Sickle-cell/Hb-C disease without crisis: Secondary | ICD-10-CM | POA: Diagnosis not present

## 2021-03-25 DIAGNOSIS — N2581 Secondary hyperparathyroidism of renal origin: Secondary | ICD-10-CM | POA: Diagnosis not present

## 2021-03-25 DIAGNOSIS — Z992 Dependence on renal dialysis: Secondary | ICD-10-CM | POA: Diagnosis not present

## 2021-03-25 DIAGNOSIS — D5720 Sickle-cell/Hb-C disease without crisis: Secondary | ICD-10-CM | POA: Diagnosis not present

## 2021-03-25 DIAGNOSIS — D571 Sickle-cell disease without crisis: Secondary | ICD-10-CM | POA: Diagnosis not present

## 2021-03-25 DIAGNOSIS — N186 End stage renal disease: Secondary | ICD-10-CM | POA: Diagnosis not present

## 2021-03-30 DIAGNOSIS — N2581 Secondary hyperparathyroidism of renal origin: Secondary | ICD-10-CM | POA: Diagnosis not present

## 2021-03-30 DIAGNOSIS — Z992 Dependence on renal dialysis: Secondary | ICD-10-CM | POA: Diagnosis not present

## 2021-03-30 DIAGNOSIS — D571 Sickle-cell disease without crisis: Secondary | ICD-10-CM | POA: Diagnosis not present

## 2021-03-30 DIAGNOSIS — N186 End stage renal disease: Secondary | ICD-10-CM | POA: Diagnosis not present

## 2021-03-30 DIAGNOSIS — D5720 Sickle-cell/Hb-C disease without crisis: Secondary | ICD-10-CM | POA: Diagnosis not present

## 2021-04-01 DIAGNOSIS — N186 End stage renal disease: Secondary | ICD-10-CM | POA: Diagnosis not present

## 2021-04-01 DIAGNOSIS — N2581 Secondary hyperparathyroidism of renal origin: Secondary | ICD-10-CM | POA: Diagnosis not present

## 2021-04-01 DIAGNOSIS — Z992 Dependence on renal dialysis: Secondary | ICD-10-CM | POA: Diagnosis not present

## 2021-04-01 DIAGNOSIS — D571 Sickle-cell disease without crisis: Secondary | ICD-10-CM | POA: Diagnosis not present

## 2021-04-01 DIAGNOSIS — D5720 Sickle-cell/Hb-C disease without crisis: Secondary | ICD-10-CM | POA: Diagnosis not present

## 2021-04-03 DIAGNOSIS — N2581 Secondary hyperparathyroidism of renal origin: Secondary | ICD-10-CM | POA: Diagnosis not present

## 2021-04-03 DIAGNOSIS — N186 End stage renal disease: Secondary | ICD-10-CM | POA: Diagnosis not present

## 2021-04-03 DIAGNOSIS — D5720 Sickle-cell/Hb-C disease without crisis: Secondary | ICD-10-CM | POA: Diagnosis not present

## 2021-04-03 DIAGNOSIS — Z992 Dependence on renal dialysis: Secondary | ICD-10-CM | POA: Diagnosis not present

## 2021-04-03 DIAGNOSIS — D571 Sickle-cell disease without crisis: Secondary | ICD-10-CM | POA: Diagnosis not present

## 2021-04-06 DIAGNOSIS — D5720 Sickle-cell/Hb-C disease without crisis: Secondary | ICD-10-CM | POA: Diagnosis not present

## 2021-04-06 DIAGNOSIS — N2581 Secondary hyperparathyroidism of renal origin: Secondary | ICD-10-CM | POA: Diagnosis not present

## 2021-04-06 DIAGNOSIS — Z992 Dependence on renal dialysis: Secondary | ICD-10-CM | POA: Diagnosis not present

## 2021-04-06 DIAGNOSIS — D571 Sickle-cell disease without crisis: Secondary | ICD-10-CM | POA: Diagnosis not present

## 2021-04-06 DIAGNOSIS — N186 End stage renal disease: Secondary | ICD-10-CM | POA: Diagnosis not present

## 2021-04-08 DIAGNOSIS — D571 Sickle-cell disease without crisis: Secondary | ICD-10-CM | POA: Diagnosis not present

## 2021-04-08 DIAGNOSIS — N186 End stage renal disease: Secondary | ICD-10-CM | POA: Diagnosis not present

## 2021-04-08 DIAGNOSIS — N2581 Secondary hyperparathyroidism of renal origin: Secondary | ICD-10-CM | POA: Diagnosis not present

## 2021-04-08 DIAGNOSIS — D5720 Sickle-cell/Hb-C disease without crisis: Secondary | ICD-10-CM | POA: Diagnosis not present

## 2021-04-08 DIAGNOSIS — Z992 Dependence on renal dialysis: Secondary | ICD-10-CM | POA: Diagnosis not present

## 2021-04-09 DIAGNOSIS — D571 Sickle-cell disease without crisis: Secondary | ICD-10-CM | POA: Diagnosis not present

## 2021-04-09 DIAGNOSIS — H3582 Retinal ischemia: Secondary | ICD-10-CM | POA: Diagnosis not present

## 2021-04-09 DIAGNOSIS — H36 Retinal disorders in diseases classified elsewhere: Secondary | ICD-10-CM | POA: Diagnosis not present

## 2021-04-10 DIAGNOSIS — N2581 Secondary hyperparathyroidism of renal origin: Secondary | ICD-10-CM | POA: Diagnosis not present

## 2021-04-10 DIAGNOSIS — N186 End stage renal disease: Secondary | ICD-10-CM | POA: Diagnosis not present

## 2021-04-10 DIAGNOSIS — Z992 Dependence on renal dialysis: Secondary | ICD-10-CM | POA: Diagnosis not present

## 2021-04-10 DIAGNOSIS — D571 Sickle-cell disease without crisis: Secondary | ICD-10-CM | POA: Diagnosis not present

## 2021-04-10 DIAGNOSIS — D5720 Sickle-cell/Hb-C disease without crisis: Secondary | ICD-10-CM | POA: Diagnosis not present

## 2021-04-13 DIAGNOSIS — D571 Sickle-cell disease without crisis: Secondary | ICD-10-CM | POA: Diagnosis not present

## 2021-04-13 DIAGNOSIS — N186 End stage renal disease: Secondary | ICD-10-CM | POA: Diagnosis not present

## 2021-04-13 DIAGNOSIS — N2581 Secondary hyperparathyroidism of renal origin: Secondary | ICD-10-CM | POA: Diagnosis not present

## 2021-04-13 DIAGNOSIS — D5720 Sickle-cell/Hb-C disease without crisis: Secondary | ICD-10-CM | POA: Diagnosis not present

## 2021-04-13 DIAGNOSIS — Z992 Dependence on renal dialysis: Secondary | ICD-10-CM | POA: Diagnosis not present

## 2021-04-15 DIAGNOSIS — Z992 Dependence on renal dialysis: Secondary | ICD-10-CM | POA: Diagnosis not present

## 2021-04-15 DIAGNOSIS — D571 Sickle-cell disease without crisis: Secondary | ICD-10-CM | POA: Diagnosis not present

## 2021-04-15 DIAGNOSIS — D5720 Sickle-cell/Hb-C disease without crisis: Secondary | ICD-10-CM | POA: Diagnosis not present

## 2021-04-15 DIAGNOSIS — N2581 Secondary hyperparathyroidism of renal origin: Secondary | ICD-10-CM | POA: Diagnosis not present

## 2021-04-15 DIAGNOSIS — N186 End stage renal disease: Secondary | ICD-10-CM | POA: Diagnosis not present

## 2021-04-15 DIAGNOSIS — Z7682 Awaiting organ transplant status: Secondary | ICD-10-CM | POA: Diagnosis not present

## 2021-04-17 DIAGNOSIS — D5720 Sickle-cell/Hb-C disease without crisis: Secondary | ICD-10-CM | POA: Diagnosis not present

## 2021-04-17 DIAGNOSIS — Z992 Dependence on renal dialysis: Secondary | ICD-10-CM | POA: Diagnosis not present

## 2021-04-17 DIAGNOSIS — N186 End stage renal disease: Secondary | ICD-10-CM | POA: Diagnosis not present

## 2021-04-17 DIAGNOSIS — N2581 Secondary hyperparathyroidism of renal origin: Secondary | ICD-10-CM | POA: Diagnosis not present

## 2021-04-17 DIAGNOSIS — D571 Sickle-cell disease without crisis: Secondary | ICD-10-CM | POA: Diagnosis not present

## 2021-04-19 DIAGNOSIS — N186 End stage renal disease: Secondary | ICD-10-CM | POA: Diagnosis not present

## 2021-04-19 DIAGNOSIS — D571 Sickle-cell disease without crisis: Secondary | ICD-10-CM | POA: Diagnosis not present

## 2021-04-19 DIAGNOSIS — Z992 Dependence on renal dialysis: Secondary | ICD-10-CM | POA: Diagnosis not present

## 2022-09-29 ENCOUNTER — Other Ambulatory Visit: Payer: Self-pay | Admitting: *Deleted

## 2022-09-29 DIAGNOSIS — Z992 Dependence on renal dialysis: Secondary | ICD-10-CM

## 2022-10-03 ENCOUNTER — Emergency Department (HOSPITAL_BASED_OUTPATIENT_CLINIC_OR_DEPARTMENT_OTHER)
Admission: EM | Admit: 2022-10-03 | Discharge: 2022-10-03 | Disposition: A | Discharge status: Home / Self Care | Payer: Medicare PPO | Attending: Emergency Medicine | Admitting: Emergency Medicine

## 2022-10-03 ENCOUNTER — Emergency Department (HOSPITAL_BASED_OUTPATIENT_CLINIC_OR_DEPARTMENT_OTHER): Payer: Medicare PPO

## 2022-10-03 ENCOUNTER — Encounter (HOSPITAL_BASED_OUTPATIENT_CLINIC_OR_DEPARTMENT_OTHER): Payer: Self-pay | Admitting: Emergency Medicine

## 2022-10-03 ENCOUNTER — Other Ambulatory Visit: Payer: Self-pay

## 2022-10-03 DIAGNOSIS — D571 Sickle-cell disease without crisis: Secondary | ICD-10-CM | POA: Diagnosis not present

## 2022-10-03 DIAGNOSIS — R079 Chest pain, unspecified: Secondary | ICD-10-CM | POA: Diagnosis present

## 2022-10-03 DIAGNOSIS — Z992 Dependence on renal dialysis: Secondary | ICD-10-CM | POA: Insufficient documentation

## 2022-10-03 DIAGNOSIS — R0602 Shortness of breath: Secondary | ICD-10-CM | POA: Insufficient documentation

## 2022-10-03 DIAGNOSIS — I120 Hypertensive chronic kidney disease with stage 5 chronic kidney disease or end stage renal disease: Secondary | ICD-10-CM | POA: Insufficient documentation

## 2022-10-03 DIAGNOSIS — Z1152 Encounter for screening for COVID-19: Secondary | ICD-10-CM | POA: Diagnosis not present

## 2022-10-03 DIAGNOSIS — N83201 Unspecified ovarian cyst, right side: Secondary | ICD-10-CM | POA: Insufficient documentation

## 2022-10-03 DIAGNOSIS — R109 Unspecified abdominal pain: Secondary | ICD-10-CM

## 2022-10-03 DIAGNOSIS — E1122 Type 2 diabetes mellitus with diabetic chronic kidney disease: Secondary | ICD-10-CM | POA: Insufficient documentation

## 2022-10-03 DIAGNOSIS — I700 Atherosclerosis of aorta: Secondary | ICD-10-CM | POA: Insufficient documentation

## 2022-10-03 DIAGNOSIS — N186 End stage renal disease: Secondary | ICD-10-CM | POA: Insufficient documentation

## 2022-10-03 LAB — RESP PANEL BY RT-PCR (RSV, FLU A&B, COVID)  RVPGX2
Influenza A by PCR: NEGATIVE
Influenza B by PCR: NEGATIVE
Resp Syncytial Virus by PCR: NEGATIVE
SARS Coronavirus 2 by RT PCR: NEGATIVE

## 2022-10-03 LAB — RETICULOCYTES
Immature Retic Fract: 44.9 % — ABNORMAL HIGH (ref 2.3–15.9)
RBC.: 2.4 MIL/uL — ABNORMAL LOW (ref 3.87–5.11)
Retic Count, Absolute: 82.6 10*3/uL (ref 19.0–186.0)
Retic Ct Pct: 3.4 % — ABNORMAL HIGH (ref 0.4–3.1)

## 2022-10-03 LAB — COMPREHENSIVE METABOLIC PANEL
ALT: 12 U/L (ref 0–44)
AST: 15 U/L (ref 15–41)
Albumin: 3.6 g/dL (ref 3.5–5.0)
Alkaline Phosphatase: 95 U/L (ref 38–126)
Anion gap: 14 (ref 5–15)
BUN: 38 mg/dL — ABNORMAL HIGH (ref 6–20)
CO2: 27 mmol/L (ref 22–32)
Calcium: 7.7 mg/dL — ABNORMAL LOW (ref 8.9–10.3)
Chloride: 95 mmol/L — ABNORMAL LOW (ref 98–111)
Creatinine, Ser: 10.66 mg/dL — ABNORMAL HIGH (ref 0.44–1.00)
GFR, Estimated: 4 mL/min — ABNORMAL LOW (ref >60)
Glucose, Bld: 90 mg/dL (ref 70–99)
Potassium: 4.7 mmol/L (ref 3.5–5.1)
Sodium: 136 mmol/L (ref 135–145)
Total Bilirubin: 0.5 mg/dL (ref 0.3–1.2)
Total Protein: 7.7 g/dL (ref 6.5–8.1)

## 2022-10-03 LAB — URINALYSIS, MICROSCOPIC (REFLEX)
Squamous Epithelial / HPF: >50 /HPF (ref 0–5)
WBC, UA: >50 WBC/hpf (ref 0–5)

## 2022-10-03 LAB — URINALYSIS, ROUTINE W REFLEX MICROSCOPIC
Bilirubin Urine: NEGATIVE
Glucose, UA: NEGATIVE mg/dL
Ketones, ur: NEGATIVE mg/dL
Nitrite: NEGATIVE
Protein, ur: 100 mg/dL — AB
Specific Gravity, Urine: 1.02 (ref 1.005–1.030)
pH: 7.5 (ref 5.0–8.0)

## 2022-10-03 LAB — CBC WITH DIFFERENTIAL/PLATELET
Abs Immature Granulocytes: 0.11 10*3/uL — ABNORMAL HIGH (ref 0.00–0.07)
Basophils Absolute: 0.1 10*3/uL (ref 0.0–0.1)
Basophils Relative: 0 %
Eosinophils Absolute: 0.2 10*3/uL (ref 0.0–0.5)
Eosinophils Relative: 1 %
HCT: 25.2 % — ABNORMAL LOW (ref 36.0–46.0)
Hemoglobin: 8.8 g/dL — ABNORMAL LOW (ref 12.0–15.0)
Immature Granulocytes: 1 %
Lymphocytes Relative: 23 %
Lymphs Abs: 4 10*3/uL (ref 0.7–4.0)
MCH: 37 pg — ABNORMAL HIGH (ref 26.0–34.0)
MCHC: 34.9 g/dL (ref 30.0–36.0)
MCV: 105.9 fL — ABNORMAL HIGH (ref 80.0–100.0)
Monocytes Absolute: 1.6 10*3/uL — ABNORMAL HIGH (ref 0.1–1.0)
Monocytes Relative: 9 %
Neutro Abs: 12 10*3/uL — ABNORMAL HIGH (ref 1.7–7.7)
Neutrophils Relative %: 66 %
Platelets: 388 10*3/uL (ref 150–400)
RBC: 2.38 MIL/uL — ABNORMAL LOW (ref 3.87–5.11)
RDW: 19.5 % — ABNORMAL HIGH (ref 11.5–15.5)
Smear Review: NORMAL
WBC: 18 10*3/uL — ABNORMAL HIGH (ref 4.0–10.5)
nRBC: 5.4 % — ABNORMAL HIGH (ref 0.0–0.2)

## 2022-10-03 LAB — TROPONIN I (HIGH SENSITIVITY): Troponin I (High Sensitivity): 6 ng/L (ref <18)

## 2022-10-03 LAB — HCG, SERUM, QUALITATIVE: Preg, Serum: NEGATIVE

## 2022-10-03 MED ORDER — IOHEXOL 300 MG/ML  SOLN
100.0000 mL | Freq: Once | INTRAMUSCULAR | Status: AC | PRN
  Administered 2022-10-03: 100 mL via INTRAVENOUS

## 2022-10-03 MED ORDER — ACETAMINOPHEN 325 MG PO TABS
650.0000 mg | ORAL_TABLET | Freq: Once | ORAL | Status: AC | PRN
  Administered 2022-10-03: 650 mg via ORAL
  Filled 2022-10-03: qty 2

## 2022-10-03 MED ORDER — MORPHINE SULFATE (PF) 4 MG/ML IV SOLN
4.0000 mg | Freq: Once | INTRAVENOUS | Status: AC
  Administered 2022-10-03: 4 mg via INTRAVENOUS
  Filled 2022-10-03: qty 1

## 2022-10-03 MED ORDER — HYDROMORPHONE HCL 1 MG/ML IJ SOLN
1.0000 mg | Freq: Once | INTRAMUSCULAR | Status: AC
  Administered 2022-10-03: 1 mg via INTRAVENOUS
  Filled 2022-10-03: qty 1

## 2022-10-03 NOTE — ED Notes (Signed)
Pt still is not able to provide a urine sample. RN Shawn informed.

## 2022-10-03 NOTE — Discharge Instructions (Signed)
You are seen in the emergency department for your flank pain.  Your workup showed no signs of pneumonia, no signs of kidney or other infection and is unclear what is causing your pain at this time.  You can continue to take your home pain medications as prescribed and you should follow-up with your primary doctor in the next few days to have your symptoms rechecked.  Your urine showed no obvious signs of infection today but we did send a culture so if this comes back positive you will receive a call and have antibiotics sent to your pharmacy.  Your CAT scan also incidentally showed that you have an ovarian cyst that you should have a follow-up ultrasound done as an outpatient.  You should return to the emergency department if you have significantly worsening pain, worsening shortness of breath, you pass out or if you have any other new or concerning symptoms.

## 2022-10-03 NOTE — ED Triage Notes (Signed)
Pt arrives pov, c/o LT flank pain since yesterday. Last dialysis Friday, new port placed 12/15. Denies dysuria

## 2022-10-03 NOTE — ED Provider Notes (Signed)
[place] EMERGENCY DEPARTMENT Provider Note   CSN: [id number] Arrival date & time: 10/03/22  1509     History  Chief Complaint  Patient presents with   Flank Pain    Julie Wells is a 52 y.o. female.  Patient is a 52 year old female with a past medical history of ESRD, sickle cell anemia, hypertension and diabetes presenting to the emergency department with chest pain and shortness of breath.  The patient states that she started to develop left lower chest pain yesterday.  She states that her pain is worse with taking deep breaths.  She states that she has a lot of mucus but has not been able to cough much up.  She states that she did not feel feverish at home.  She denies any nausea, vomiting or diarrhea.  She states that she does not make urine regularly.  She states that she has had pneumonia in the past and this feels similar.  She states that she has never had acute chest syndrome.  The history is provided by the patient and the spouse.  Flank Pain       Home Medications Prior to Admission medications   Medication Sig Start Date End Date Taking? Authorizing Provider  acetaminophen (TYLENOL) 500 MG tablet Take 500-1,000 mg by mouth every 6 (six) hours as needed for mild pain or headache.     [provider]  albuterol (VENTOLIN HFA) 108 (90 Base) MCG/ACT inhaler Inhale 2 puffs into the lungs every 6 (six) hours as needed for wheezing or shortness of breath.    [provider]  atorvastatin (LIPITOR) 40 MG tablet Take 40 mg by mouth daily. 07/03/20   [provider]  B Complex-C-Folic Acid (DIALYVITE TABLET) TABS Take 1 tablet by mouth daily. 09/22/20   [provider]  carvedilol (COREG) 25 MG tablet Take 25 mg by mouth 2 (two) times daily with a meal. 07/12/20   [provider]  cinacalcet (SENSIPAR) 60 MG tablet Take 60 mg by mouth daily.    [provider]  hydroxyurea (HYDREA) 500 MG capsule Take 500 mg by  mouth daily. May take with food to minimize GI side effects.    [provider]  lidocaine-prilocaine (EMLA) cream SMARTSIG:Sparingly Topical 3 Times a Week PRN 01/21/21   [provider]  NIFEdipine (ADALAT CC) 30 MG 24 hr tablet Take 30 mg by mouth daily. 05/09/20   [provider]  sevelamer carbonate (RENVELA) 800 MG tablet Take 1,600 mg by mouth 3 (three) times daily with meals. 08/27/20   [provider]      Allergies    Ciprofloxacin, Levaquin [levofloxacin in d5w], and Other    Review of Systems   Review of Systems  Genitourinary:  Positive for flank pain.    Physical Exam Updated Vital Signs BP 122/72 (BP Location: Right Arm)   Pulse 87   Temp 98.3 F (36.8 C) (Oral)   Resp 17   Ht '5\' 2"'$  (1.575 m)   Wt 69.9 kg   LMP 09/14/2022   SpO2 95%   BMI 28.17 kg/m  Physical Exam Vitals and nursing note reviewed.  Constitutional:      General: She is not in acute distress.    Appearance: Normal appearance.  HENT:     Head: Normocephalic and atraumatic.     Nose: Congestion present.     Mouth/Throat:     Mouth: Mucous membranes are moist.     Pharynx: Oropharynx is clear.  Eyes:     Extraocular Movements: Extraocular movements intact.     Conjunctiva/sclera: Conjunctivae normal.  Cardiovascular:     Rate and Rhythm: Normal rate and regular rhythm.     Heart sounds: Normal heart sounds.  Pulmonary:     Effort: Pulmonary effort is normal.     Breath sounds: Normal breath sounds.  Abdominal:     General: Abdomen is flat.     Palpations: Abdomen is soft.     Tenderness: There is no abdominal tenderness. There is no right CVA tenderness or left CVA tenderness.  Musculoskeletal:        General: Normal range of motion.     Cervical back: Normal range of motion and neck supple.     Right lower leg: No edema.     Left lower leg: No edema.  Skin:    General: Skin is warm and dry.  Neurological:     General: No focal deficit present.      Mental Status: She is alert and oriented to person, place, and time.  Psychiatric:        Mood and Affect: Mood normal.        Behavior: Behavior normal.     ED Results / Procedures / Treatments   Labs (all labs ordered are listed, but only abnormal results are displayed) Labs Reviewed  URINALYSIS, ROUTINE W REFLEX MICROSCOPIC - Abnormal; Notable for the following components:      Result Value   APPearance TURBID (*)    Hgb urine dipstick TRACE (*)    Protein, ur 100 (*)    Leukocytes,Ua MODERATE (*)    All other components within normal limits  COMPREHENSIVE METABOLIC PANEL - Abnormal; Notable for the following components:   Chloride 95 (*)    BUN 38 (*)    Creatinine, Ser 10.66 (*)    Calcium 7.7 (*)    GFR, Estimated 4 (*)    All other components within normal limits  CBC WITH DIFFERENTIAL/PLATELET - Abnormal; Notable for the following components:   WBC 18.0 (*)    RBC 2.38 (*)    Hemoglobin 8.8 (*)    HCT 25.2 (*)    MCV 105.9 (*)    MCH 37.0 (*)    RDW 19.5 (*)    nRBC 5.4 (*)    Neutro Abs 12.0 (*)    Monocytes Absolute 1.6 (*)    Abs Immature Granulocytes 0.11 (*)    All other components within normal limits  RETICULOCYTES - Abnormal; Notable for the following components:   Retic Ct Pct 3.4 (*)    RBC. 2.40 (*)    Immature Retic Fract 44.9 (*)    All other components within normal limits  URINALYSIS, MICROSCOPIC (REFLEX) - Abnormal; Notable for the following components:   Bacteria, UA MANY (*)    Non Squamous Epithelial PRESENT (*)    All other components within normal limits  RESP PANEL BY RT-PCR (RSV, FLU A&B, COVID)  [id number]  URINE CULTURE  HCG, SERUM, QUALITATIVE  TROPONIN I (HIGH SENSITIVITY)    EKG EKG Interpretation  Date/Time:  Sunday October 03 2022 18:16:09 EST Ventricular Rate:  80 PR Interval:  148 QRS Duration: 106 QT Interval:  440 QTC Calculation: 508 R Axis:   47 Text Interpretation: Sinus rhythm Nonspecific T abnormalities, lateral  leads Borderline prolonged QT interval Duplicate EKG Confirmed by Leanord Asal ([id number]) on 10/03/2022 6:43:38 PM  Radiology CT Abdomen Pelvis W Contrast  Result Date: 10/03/2022 CLINICAL DATA:  Abdominal/flank pain, stone suspected EXAM: CT ABDOMEN AND PELVIS WITH CONTRAST TECHNIQUE: Multidetector CT imaging of the abdomen and pelvis was performed using the standard protocol following bolus administration of intravenous contrast. RADIATION DOSE REDUCTION: This exam was performed according to the departmental dose-optimization program which includes automated exposure control, adjustment of the mA and/or kV according to patient size and/or use of iterative reconstruction technique. CONTRAST:  167m OMNIPAQUE IOHEXOL 300 MG/ML  SOLN COMPARISON:  MRI abdomen 08/06/2016, CT abdomen pelvis 04/20/2009 report without imaging FINDINGS: Lower chest: No acute abnormality. Hepatobiliary: No focal liver abnormality. No gallstones, gallbladder wall thickening, or pericholecystic fluid. No biliary dilatation. Pancreas: No focal lesion. Normal pancreatic contour. No surrounding inflammatory changes. No main pancreatic ductal dilatation. Spleen: Normal in size without focal abnormality. Adrenals/Urinary Tract: No adrenal nodule bilaterally. Bilateral atrophic kidneys. Bilateral kidneys enhance symmetrically. Multiple fluid density lesions within the kidneys likely represent simple renal cysts. Simple renal cysts, in the absence of clinically indicated signs/symptoms, require no independent follow-up. Subcentimeter hypodensities are too small to characterize-no further follow-up indicated. No hydronephrosis. No hydroureter. The urinary bladder is unremarkable. No excretion of intravenous contrast on delayed view. Stomach/Bowel: Stomach is within normal limits. No evidence of bowel wall thickening or dilatation. Appendix appears normal. Vascular/Lymphatic: No abdominal aorta or iliac aneurysm. Mild to moderate atherosclerotic  plaque of the aorta and its branches. No abdominal, pelvic, or inguinal lymphadenopathy. Reproductive: Multiseptated 3.8 x 3.2 cm left ovarian cystic lesion. Multi septated 3.7 x 3 cm right ovarian cystic lesion that is better evaluated on coronal images. Uterus and bilateral adnexa are unremarkable. Other: No intraperitoneal free fluid. No intraperitoneal free gas. No organized fluid collection. Musculoskeletal: No abdominal wall hernia or abnormality. No suspicious lytic or blastic osseous lesions. No acute displaced fracture. Multilevel degenerative changes of the spine. IMPRESSION: 1. Multiseptated 3.8 cm left and 3.7 cm right ovarian cystic lesions. Recommend pelvic ultrasound for further evaluation. 2. Bilateral atrophic kidneys with no excretion of intravenous contrast on delayed view. Correlate with renal function. 3.  Aortic Atherosclerosis (ICD10-I70.0). Electronically Signed   By: MIven FinnM.D.   On: 10/03/2022 20:19   DG Chest 2 View  Result Date: 10/03/2022 CLINICAL DATA:  Chest pain and shortness of breath.  Dialysis. EXAM: CHEST - 2 VIEW COMPARISON:  06/20/2016 FINDINGS: The heart size and mediastinal contours are within normal limits. Right jugular dual-lumen central venous dialysis catheter is seen in appropriate position. Evidence of pneumothorax. Low lung volumes are seen with mild bibasilar atelectasis. No evidence of pulmonary infiltrate or pleural effusion. IMPRESSION: Low lung volumes with mild bibasilar atelectasis. Electronically Signed   By: JMarlaine HindM.D.   On: 10/03/2022 17:57    Procedures Procedures    Medications Ordered in ED Medications  acetaminophen (TYLENOL) tablet 650 mg (650 mg Oral Given 10/03/22 1542)  iohexol (OMNIPAQUE) 300 MG/ML solution 100 mL (100 mLs Intravenous Contrast Given 10/03/22 1954)  morphine (PF) 4 MG/ML injection 4 mg (4 mg Intravenous Given 10/03/22 2012)  HYDROmorphone (DILAUDID) injection 1 mg (1 mg Intravenous Given 10/03/22 2139)     ED Course/ Medical Decision Making/ A&P Clinical Course as of 10/03/22 2238  Sun Oct 03, 2022  1937 Patient has significant leukocytosis, though baseline WBC 14-16, with unknown source of infection at this time. No signs of pneumonia or acute chest syndrome. CT abdomen pelvis will be performed to evaluate for intra-abdominal infection as cause of her symptoms. [VK]  2049 R ovarian cystic lesion on Abd CT but no  explanation of patient's pain.  Patient is requesting repeat pain medication.  She states that this does not feel similar to prior sickle cell pain crises in the past as normally she has pain in her legs and never gets flank pain. [VK]  2204 Reticulocyte count appropriately elevated.  [VK]  2227 On reassessment, patient's pain is well-controlled and feels she feels comfortable with discharge home.  She was recommended close primary care follow-up and was given strict return precautions. [VK]  2234 Urine does have bacteria and moderate leuks but significant squams making UTI less likely. Urine culture will be sent but antibiotics will not be started at this time. [VK]    Clinical Course User Index [VK] Kemper Durie, DO                             Medical Decision Making This patient presents to the ED with chief complaint(s) of chest pain with pertinent past medical history of ESRD, sickle cell anemia, HTN, DM which further complicates the presenting complaint. The complaint involves an extensive differential diagnosis and also carries with it a high risk of complications and morbidity.    The differential diagnosis includes ACS, arrhythmia, anemia, electrolyte abnormality, pneumonia, pneumothorax, acute chest syndrome, pain crisis, pyelonephritis or nephrolithiasis less likely as patient is ESRD and does not make urine  Additional history obtained: Additional history obtained from spouse Records reviewed Care Everywhere/External Records  ED Course and  Reassessment: Patient was initially evaluated in triage and had viral swab performed that was tested negative.  She will have labs including reticulocyte count, chest x-ray, EKG performed.  She was febrile on arrival here concerning for possible infection and did receive Tylenol.  She will be closely reassessed.  Independent labs interpretation:  The following labs were independently interpreted: Mild leukocytosis compared to baseline, remainder of labs at baseline  Independent visualization of imaging: - I independently visualized the following imaging with scope of interpretation limited to determining acute life threatening conditions related to emergency care: Chest x-ray, CT AP, which revealed ovarian cyst otherwise no acute disease to explain symptoms  Consultation: - Consulted or discussed management/test interpretation w/ external professional: N/A  Consideration for admission or further workup: Patient has no emergent conditions requiring admission or further work-up at this time and is stable for discharge home with primary care follow-up  Social Determinants of health: N/A    Amount and/or Complexity of Data Reviewed Labs: ordered. Radiology: ordered.  Risk OTC drugs. Prescription drug management.          Final Clinical Impression(s) / ED Diagnoses Final diagnoses:  Left flank pain  Right ovarian cyst    Rx / DC Orders ED Discharge Orders     None         Kemper Durie, DO 10/03/22 2238

## 2022-10-03 NOTE — ED Notes (Signed)
Pt aware urine specimen ordered. Pt reports inability to provide specimen at this time. Specimen collection device provided to patient. 

## 2022-10-05 ENCOUNTER — Ambulatory Visit: Payer: Medicare PPO | Admitting: Vascular Surgery

## 2022-10-05 ENCOUNTER — Other Ambulatory Visit (HOSPITAL_COMMUNITY): Payer: Medicare PPO

## 2022-10-05 ENCOUNTER — Encounter (HOSPITAL_COMMUNITY): Payer: Medicare PPO

## 2022-10-06 ENCOUNTER — Encounter (HOSPITAL_BASED_OUTPATIENT_CLINIC_OR_DEPARTMENT_OTHER): Payer: Self-pay

## 2022-10-06 ENCOUNTER — Other Ambulatory Visit: Payer: Self-pay

## 2022-10-06 ENCOUNTER — Observation Stay (HOSPITAL_COMMUNITY): Payer: Medicare PPO

## 2022-10-06 ENCOUNTER — Emergency Department (HOSPITAL_BASED_OUTPATIENT_CLINIC_OR_DEPARTMENT_OTHER): Payer: Medicare PPO

## 2022-10-06 ENCOUNTER — Encounter (HOSPITAL_COMMUNITY): Payer: Self-pay

## 2022-10-06 ENCOUNTER — Inpatient Hospital Stay (HOSPITAL_BASED_OUTPATIENT_CLINIC_OR_DEPARTMENT_OTHER)
Admission: EM | Admit: 2022-10-06 | Discharge: 2022-10-13 | DRG: 689 | Disposition: A | Discharge status: Home / Self Care | Payer: Medicare PPO | Source: Ambulatory Visit | Attending: Internal Medicine | Admitting: Internal Medicine

## 2022-10-06 DIAGNOSIS — I120 Hypertensive chronic kidney disease with stage 5 chronic kidney disease or end stage renal disease: Secondary | ICD-10-CM | POA: Diagnosis not present

## 2022-10-06 DIAGNOSIS — E7800 Pure hypercholesterolemia, unspecified: Secondary | ICD-10-CM | POA: Diagnosis present

## 2022-10-06 DIAGNOSIS — J189 Pneumonia, unspecified organism: Secondary | ICD-10-CM | POA: Diagnosis not present

## 2022-10-06 DIAGNOSIS — N83202 Unspecified ovarian cyst, left side: Secondary | ICD-10-CM | POA: Diagnosis present

## 2022-10-06 DIAGNOSIS — D571 Sickle-cell disease without crisis: Secondary | ICD-10-CM

## 2022-10-06 DIAGNOSIS — E875 Hyperkalemia: Secondary | ICD-10-CM | POA: Diagnosis not present

## 2022-10-06 DIAGNOSIS — J9811 Atelectasis: Secondary | ICD-10-CM | POA: Diagnosis not present

## 2022-10-06 DIAGNOSIS — R52 Pain, unspecified: Secondary | ICD-10-CM | POA: Diagnosis not present

## 2022-10-06 DIAGNOSIS — Z6832 Body mass index (BMI) 32.0-32.9, adult: Secondary | ICD-10-CM

## 2022-10-06 DIAGNOSIS — Z881 Allergy status to other antibiotic agents status: Secondary | ICD-10-CM | POA: Diagnosis not present

## 2022-10-06 DIAGNOSIS — N289 Disorder of kidney and ureter, unspecified: Secondary | ICD-10-CM | POA: Diagnosis present

## 2022-10-06 DIAGNOSIS — R109 Unspecified abdominal pain: Secondary | ICD-10-CM | POA: Diagnosis present

## 2022-10-06 DIAGNOSIS — Z1152 Encounter for screening for COVID-19: Secondary | ICD-10-CM

## 2022-10-06 DIAGNOSIS — N12 Tubulo-interstitial nephritis, not specified as acute or chronic: Secondary | ICD-10-CM | POA: Diagnosis not present

## 2022-10-06 DIAGNOSIS — E1129 Type 2 diabetes mellitus with other diabetic kidney complication: Secondary | ICD-10-CM | POA: Diagnosis present

## 2022-10-06 DIAGNOSIS — B9620 Unspecified Escherichia coli [E. coli] as the cause of diseases classified elsewhere: Secondary | ICD-10-CM | POA: Diagnosis present

## 2022-10-06 DIAGNOSIS — Z992 Dependence on renal dialysis: Secondary | ICD-10-CM | POA: Diagnosis not present

## 2022-10-06 DIAGNOSIS — N83201 Unspecified ovarian cyst, right side: Secondary | ICD-10-CM | POA: Diagnosis present

## 2022-10-06 DIAGNOSIS — D638 Anemia in other chronic diseases classified elsewhere: Secondary | ICD-10-CM | POA: Diagnosis not present

## 2022-10-06 DIAGNOSIS — E669 Obesity, unspecified: Secondary | ICD-10-CM | POA: Diagnosis not present

## 2022-10-06 DIAGNOSIS — I69311 Memory deficit following cerebral infarction: Secondary | ICD-10-CM

## 2022-10-06 DIAGNOSIS — E785 Hyperlipidemia, unspecified: Secondary | ICD-10-CM | POA: Diagnosis present

## 2022-10-06 DIAGNOSIS — N189 Chronic kidney disease, unspecified: Secondary | ICD-10-CM | POA: Diagnosis present

## 2022-10-06 DIAGNOSIS — E1122 Type 2 diabetes mellitus with diabetic chronic kidney disease: Secondary | ICD-10-CM | POA: Diagnosis present

## 2022-10-06 DIAGNOSIS — Z79899 Other long term (current) drug therapy: Secondary | ICD-10-CM | POA: Diagnosis not present

## 2022-10-06 DIAGNOSIS — N250 Renal osteodystrophy: Secondary | ICD-10-CM | POA: Diagnosis not present

## 2022-10-06 DIAGNOSIS — Y712 Prosthetic and other implants, materials and accessory cardiovascular devices associated with adverse incidents: Secondary | ICD-10-CM | POA: Diagnosis not present

## 2022-10-06 DIAGNOSIS — D631 Anemia in chronic kidney disease: Secondary | ICD-10-CM | POA: Diagnosis not present

## 2022-10-06 DIAGNOSIS — N186 End stage renal disease: Secondary | ICD-10-CM | POA: Diagnosis not present

## 2022-10-06 DIAGNOSIS — T82868A Thrombosis of vascular prosthetic devices, implants and grafts, initial encounter: Secondary | ICD-10-CM | POA: Diagnosis not present

## 2022-10-06 DIAGNOSIS — D5700 Hb-SS disease with crisis, unspecified: Secondary | ICD-10-CM | POA: Diagnosis not present

## 2022-10-06 LAB — CBC WITH DIFFERENTIAL/PLATELET
Abs Immature Granulocytes: 0.14 10*3/uL — ABNORMAL HIGH (ref 0.00–0.07)
Basophils Absolute: 0.1 10*3/uL (ref 0.0–0.1)
Basophils Relative: 0 %
Eosinophils Absolute: 0.3 10*3/uL (ref 0.0–0.5)
Eosinophils Relative: 1 %
HCT: 23.9 % — ABNORMAL LOW (ref 36.0–46.0)
Hemoglobin: 8.3 g/dL — ABNORMAL LOW (ref 12.0–15.0)
Immature Granulocytes: 1 %
Lymphocytes Relative: 16 %
Lymphs Abs: 3.2 10*3/uL (ref 0.7–4.0)
MCH: 36.9 pg — ABNORMAL HIGH (ref 26.0–34.0)
MCHC: 34.7 g/dL (ref 30.0–36.0)
MCV: 106.2 fL — ABNORMAL HIGH (ref 80.0–100.0)
Monocytes Absolute: 1.9 10*3/uL — ABNORMAL HIGH (ref 0.1–1.0)
Monocytes Relative: 9 %
Neutro Abs: 14.8 10*3/uL — ABNORMAL HIGH (ref 1.7–7.7)
Neutrophils Relative %: 73 %
Platelets: 375 10*3/uL (ref 150–400)
RBC: 2.25 MIL/uL — ABNORMAL LOW (ref 3.87–5.11)
RDW: 19.2 % — ABNORMAL HIGH (ref 11.5–15.5)
Smear Review: NORMAL
WBC: 20.4 10*3/uL — ABNORMAL HIGH (ref 4.0–10.5)
nRBC: 7.6 % — ABNORMAL HIGH (ref 0.0–0.2)

## 2022-10-06 LAB — COMPREHENSIVE METABOLIC PANEL
ALT: 18 U/L (ref 0–44)
AST: 26 U/L (ref 15–41)
Albumin: 3.5 g/dL (ref 3.5–5.0)
Alkaline Phosphatase: 100 U/L (ref 38–126)
Anion gap: 14 (ref 5–15)
BUN: 40 mg/dL — ABNORMAL HIGH (ref 6–20)
CO2: 27 mmol/L (ref 22–32)
Calcium: 7.7 mg/dL — ABNORMAL LOW (ref 8.9–10.3)
Chloride: 95 mmol/L — ABNORMAL LOW (ref 98–111)
Creatinine, Ser: 9.76 mg/dL — ABNORMAL HIGH (ref 0.44–1.00)
GFR, Estimated: 4 mL/min — ABNORMAL LOW (ref >60)
Glucose, Bld: 108 mg/dL — ABNORMAL HIGH (ref 70–99)
Potassium: 4.8 mmol/L (ref 3.5–5.1)
Sodium: 136 mmol/L (ref 135–145)
Total Bilirubin: 0.5 mg/dL (ref 0.3–1.2)
Total Protein: 8.1 g/dL (ref 6.5–8.1)

## 2022-10-06 LAB — URINALYSIS, ROUTINE W REFLEX MICROSCOPIC
Bilirubin Urine: NEGATIVE
Glucose, UA: NEGATIVE mg/dL
Ketones, ur: NEGATIVE mg/dL
Nitrite: NEGATIVE
Protein, ur: 100 mg/dL — AB
Specific Gravity, Urine: 1.015 (ref 1.005–1.030)
pH: 8.5 — ABNORMAL HIGH (ref 5.0–8.0)

## 2022-10-06 LAB — URINE CULTURE: Culture: >=100000 — AB

## 2022-10-06 LAB — HIV ANTIBODY (ROUTINE TESTING W REFLEX): HIV Screen 4th Generation wRfx: NONREACTIVE

## 2022-10-06 LAB — CBC
HCT: 23 % — ABNORMAL LOW (ref 36.0–46.0)
Hemoglobin: 8.2 g/dL — ABNORMAL LOW (ref 12.0–15.0)
MCH: 37.4 pg — ABNORMAL HIGH (ref 26.0–34.0)
MCHC: 35.7 g/dL (ref 30.0–36.0)
MCV: 105 fL — ABNORMAL HIGH (ref 80.0–100.0)
Platelets: 344 10*3/uL (ref 150–400)
RBC: 2.19 MIL/uL — ABNORMAL LOW (ref 3.87–5.11)
RDW: 19.2 % — ABNORMAL HIGH (ref 11.5–15.5)
WBC: 16 10*3/uL — ABNORMAL HIGH (ref 4.0–10.5)
nRBC: 9.3 % — ABNORMAL HIGH (ref 0.0–0.2)

## 2022-10-06 LAB — URINALYSIS, MICROSCOPIC (REFLEX)

## 2022-10-06 LAB — CREATININE, SERUM
Creatinine, Ser: 11.04 mg/dL — ABNORMAL HIGH (ref 0.44–1.00)
GFR, Estimated: 4 mL/min — ABNORMAL LOW (ref >60)

## 2022-10-06 LAB — HEPATITIS B SURFACE ANTIGEN: Hepatitis B Surface Ag: NONREACTIVE

## 2022-10-06 LAB — GLUCOSE, CAPILLARY: Glucose-Capillary: 116 mg/dL — ABNORMAL HIGH (ref 70–99)

## 2022-10-06 MED ORDER — ATORVASTATIN CALCIUM 40 MG PO TABS
40.0000 mg | ORAL_TABLET | Freq: Every day | ORAL | Status: DC
  Administered 2022-10-06 – 2022-10-13 (×7): 40 mg via ORAL
  Filled 2022-10-06 (×7): qty 1

## 2022-10-06 MED ORDER — CHLORHEXIDINE GLUCONATE CLOTH 2 % EX PADS
6.0000 | MEDICATED_PAD | Freq: Every day | CUTANEOUS | Status: DC
  Administered 2022-10-07 – 2022-10-10 (×4): 6 via TOPICAL

## 2022-10-06 MED ORDER — INSULIN ASPART 100 UNIT/ML IJ SOLN
0.0000 [IU] | Freq: Three times a day (TID) | INTRAMUSCULAR | Status: DC
  Administered 2022-10-09: 1 [IU] via SUBCUTANEOUS
  Administered 2022-10-11: 2 [IU] via SUBCUTANEOUS

## 2022-10-06 MED ORDER — SODIUM CHLORIDE 0.9% FLUSH
3.0000 mL | Freq: Two times a day (BID) | INTRAVENOUS | Status: DC
  Administered 2022-10-07 – 2022-10-13 (×12): 3 mL via INTRAVENOUS

## 2022-10-06 MED ORDER — OXYCODONE HCL 5 MG PO TABS
10.0000 mg | ORAL_TABLET | Freq: Four times a day (QID) | ORAL | Status: DC | PRN
  Administered 2022-10-06 – 2022-10-13 (×20): 10 mg via ORAL
  Filled 2022-10-06 (×20): qty 2

## 2022-10-06 MED ORDER — ACETAMINOPHEN 325 MG PO TABS: 650.0000 mg | ORAL_TABLET | Freq: Four times a day (QID) | ORAL | Status: DC | PRN

## 2022-10-06 MED ORDER — IOHEXOL 350 MG/ML SOLN
75.0000 mL | Freq: Once | INTRAVENOUS | Status: AC | PRN
  Administered 2022-10-06: 75 mL via INTRAVENOUS

## 2022-10-06 MED ORDER — VITAMIN D 25 MCG (1000 UNIT) PO TABS
2000.0000 [IU] | ORAL_TABLET | Freq: Every day | ORAL | Status: DC
  Administered 2022-10-06 – 2022-10-13 (×7): 2000 [IU] via ORAL
  Filled 2022-10-06 (×7): qty 2

## 2022-10-06 MED ORDER — PATIROMER SORBITEX CALCIUM 8.4 G PO PACK
8.4000 g | PACK | ORAL | Status: DC
  Administered 2022-10-10 – 2022-10-12 (×2): 8.4 g via ORAL
  Filled 2022-10-06 (×4): qty 1

## 2022-10-06 MED ORDER — RENA-VITE PO TABS
1.0000 | ORAL_TABLET | Freq: Every day | ORAL | Status: DC
  Administered 2022-10-06 – 2022-10-13 (×7): 1 via ORAL
  Filled 2022-10-06 (×7): qty 1

## 2022-10-06 MED ORDER — CYCLOBENZAPRINE HCL 5 MG PO TABS
5.0000 mg | ORAL_TABLET | Freq: Three times a day (TID) | ORAL | Status: DC | PRN
  Administered 2022-10-06 – 2022-10-12 (×7): 5 mg via ORAL
  Filled 2022-10-06 (×7): qty 1

## 2022-10-06 MED ORDER — HYDROMORPHONE HCL 1 MG/ML IJ SOLN
1.0000 mg | Freq: Once | INTRAMUSCULAR | Status: AC
  Administered 2022-10-06: 1 mg via INTRAVENOUS
  Filled 2022-10-06: qty 1

## 2022-10-06 MED ORDER — HYDRALAZINE HCL 20 MG/ML IJ SOLN: 10.0000 mg | Freq: Four times a day (QID) | INTRAMUSCULAR | Status: DC | PRN

## 2022-10-06 MED ORDER — HEPARIN SODIUM (PORCINE) 5000 UNIT/ML IJ SOLN
5000.0000 [IU] | Freq: Three times a day (TID) | INTRAMUSCULAR | Status: DC
  Administered 2022-10-07 – 2022-10-13 (×17): 5000 [IU] via SUBCUTANEOUS
  Filled 2022-10-06 (×17): qty 1

## 2022-10-06 MED ORDER — SODIUM CHLORIDE 0.9 % IV SOLN
1.0000 g | Freq: Once | INTRAVENOUS | Status: AC
  Administered 2022-10-06: 1 g via INTRAVENOUS
  Filled 2022-10-06: qty 10

## 2022-10-06 MED ORDER — ONDANSETRON HCL 4 MG/2ML IJ SOLN: 4.0000 mg | Freq: Four times a day (QID) | INTRAMUSCULAR | Status: DC | PRN

## 2022-10-06 MED ORDER — OXYCODONE-ACETAMINOPHEN 5-325 MG PO TABS
2.0000 | ORAL_TABLET | Freq: Once | ORAL | Status: AC
  Administered 2022-10-06: 2 via ORAL
  Filled 2022-10-06: qty 2

## 2022-10-06 MED ORDER — CARVEDILOL 25 MG PO TABS: 25.0000 mg | ORAL_TABLET | Freq: Two times a day (BID) | ORAL | Status: DC

## 2022-10-06 MED ORDER — CINACALCET HCL 30 MG PO TABS
30.0000 mg | ORAL_TABLET | Freq: Every day | ORAL | Status: DC
  Administered 2022-10-06 – 2022-10-13 (×7): 30 mg via ORAL
  Filled 2022-10-06 (×7): qty 1

## 2022-10-06 MED ORDER — ONDANSETRON HCL 4 MG PO TABS: 4.0000 mg | ORAL_TABLET | Freq: Four times a day (QID) | ORAL | Status: DC | PRN

## 2022-10-06 MED ORDER — HYDROXYUREA 500 MG PO CAPS
500.0000 mg | ORAL_CAPSULE | Freq: Every day | ORAL | Status: DC
  Administered 2022-10-06 – 2022-10-13 (×6): 500 mg via ORAL
  Filled 2022-10-06 (×9): qty 1

## 2022-10-06 MED ORDER — ACETAMINOPHEN 650 MG RE SUPP: 650.0000 mg | Freq: Four times a day (QID) | RECTAL | Status: DC | PRN

## 2022-10-06 MED ORDER — SODIUM CHLORIDE 0.9 % IV SOLN: 250.0000 mL | INTRAVENOUS | Status: DC | PRN

## 2022-10-06 MED ORDER — HYDROMORPHONE HCL 1 MG/ML IJ SOLN
0.5000 mg | INTRAMUSCULAR | Status: DC | PRN
  Administered 2022-10-06: 0.5 mg via INTRAVENOUS
  Filled 2022-10-06: qty 1

## 2022-10-06 MED ORDER — OXYCODONE HCL 5 MG PO TABS
5.0000 mg | ORAL_TABLET | ORAL | Status: AC
  Administered 2022-10-06: 5 mg via ORAL
  Filled 2022-10-06: qty 1

## 2022-10-06 MED ORDER — POLYETHYLENE GLYCOL 3350 17 G PO PACK: 17.0000 g | PACK | Freq: Every day | ORAL | Status: DC | PRN

## 2022-10-06 MED ORDER — HYDROMORPHONE HCL 1 MG/ML IJ SOLN
0.5000 mg | INTRAMUSCULAR | Status: DC | PRN
  Administered 2022-10-08 – 2022-10-11 (×3): 1 mg via INTRAVENOUS
  Filled 2022-10-06 (×3): qty 1

## 2022-10-06 MED ORDER — SODIUM CHLORIDE 0.9 % IV SOLN
2.0000 g | INTRAVENOUS | Status: AC
  Administered 2022-10-07 – 2022-10-12 (×7): 2 g via INTRAVENOUS
  Filled 2022-10-06 (×7): qty 20

## 2022-10-06 MED ORDER — SODIUM CHLORIDE 0.9% FLUSH: 3.0000 mL | INTRAVENOUS | Status: DC | PRN

## 2022-10-06 MED ORDER — NIFEDIPINE ER OSMOTIC RELEASE 30 MG PO TB24
30.0000 mg | ORAL_TABLET | Freq: Every day | ORAL | Status: DC
  Administered 2022-10-06: 30 mg via ORAL
  Filled 2022-10-06 (×2): qty 1

## 2022-10-06 MED ORDER — SEVELAMER CARBONATE 800 MG PO TABS
1600.0000 mg | ORAL_TABLET | Freq: Three times a day (TID) | ORAL | Status: DC
  Administered 2022-10-07 – 2022-10-13 (×18): 1600 mg via ORAL
  Filled 2022-10-06 (×18): qty 2

## 2022-10-06 NOTE — ED Notes (Signed)
ED TO INPATIENT HANDOFF REPORT  ED Nurse Name and Phone #: Edna Rede Martinique, RN  S Name/Age/Gender Julie Wells 52 y.o. female Room/Bed: MH06/MH06  Code Status   Code Status: Prior  Home/SNF/Other Home Patient oriented to: self, place, time, and situation Is this baseline? Yes   Triage Complete: Triage complete  Chief Complaint Intractable pain [R52]  Triage Note Patient arrives today for shortness of breath that started on Sunday. Breathing is worse on exertion and movement. Patient does not wear oxygen at home. Patient receives dialysis on MWF. Denies chest pain, N/V. Also complains on of chronic left side flank pain rated 8/10. Patient was seen here on Sunday.    Allergies Allergies  Allergen Reactions   Levofloxacin Anaphylaxis   Ciprofloxacin Swelling   Levaquin [Levofloxacin In D5w] Hives and Swelling   Other Swelling    Unnamed med caused swelling of the lips (patient cannot recall the indication or name)    Level of Care/Admitting Diagnosis ED Disposition     ED Disposition  Admit   Condition  --   [place]: [place] [[id number]]  Level of Care: Med-Surg [16]  Interfacility transfer: Yes  May place patient in observation at [facility] or [place] if equivalent level of care is available:: No  Covid Evaluation: Asymptomatic - no recent exposure (last 10 days) testing not required  Diagnosis: Intractable pain [[id number]]  Admitting Physician: Vianne Bulls [[id number]]  Attending Physician: Vianne Bulls [[id number]]          B Medical/Surgery History Past Medical History:  Diagnosis Date   Chronic kidney disease    Frequent headaches    Hematuria 06/20/2016   High cholesterol    History of blood transfusion    multiple, no reactions   Hypertension    Migraine    " I constantly have my migraines" (06/23/2016)   Pneumonia    Short-term memory loss    Archie Endo 06/23/2016   Sickle cell disease without crisis ([place])  03/12/2020   Sickle cell trait ([place])    Stroke ([place]) 2010   short term memory loss/notes 06/23/2016   Type 2 diabetes, diet controlled ([place])    no meds, diet controlled   Past Surgical History:  Procedure Laterality Date   AV FISTULA PLACEMENT Left 12/04/2020   Procedure: LEFT BRACHOBASCILIC  ARTERIOVENOUS (AV) FISTULA CREATION;  Surgeon: [name], MD;  Location: [place];  Service: Vascular;  Laterality: Left;  PERIPHERAL NERVE BLOCK   AV FISTULA PLACEMENT Left 12/25/2020   Procedure: LEFT UPPER ARM ARTERIOVENOUS LOOP GRAFT PLACEMENT;  Surgeon: [name], MD;  Location: [place];  Service: Vascular;  Laterality: Left;   CESAREAN SECTION  X 2   COLONOSCOPY  2021   x 3   TUBAL LIGATION       A IV Location/Drains/Wounds Patient Lines/Drains/Airways Status     Active Line/Drains/Airways     Name Placement date Placement time Site Days   Peripheral IV 10/06/22 20 G 1" Right Antecubital 10/06/22  0131  Antecubital  less than 1   Fistula / Graft Left Forearm Arteriovenous fistula 12/04/20  0840  Forearm  671   Fistula / Graft Left Upper arm Arteriovenous vein graft 12/25/20  1039  Upper arm  650   Hemodialysis Catheter Right Subclavian Double lumen Permanent (Tunneled) --  --  Subclavian  --            Intake/Output Last 24 hours  Intake/Output Summary (Last  24 hours) at 10/06/2022 1157 Last data filed at 10/06/2022 0326 Gross per 24 hour  Intake 100 ml  Output --  Net 100 ml    Labs/Imaging Results for orders placed or performed during the hospital encounter of 10/06/22 (from the past 48 hour(s))  CBC with Differential     Status: Abnormal   Collection Time: 10/06/22  1:31 AM  Result Value Ref Range   WBC 20.4 (H) 4.0 - 10.5 K/uL    Comment: REPEATED TO VERIFY WHITE COUNT CONFIRMED ON SMEAR    RBC 2.25 (L) 3.87 - 5.11 MIL/uL   Hemoglobin 8.3 (L) 12.0 - 15.0 g/dL   HCT 23.9 (L) 36.0 - 46.0 %   MCV 106.2 (H) 80.0 - 100.0 fL   MCH 36.9 (H) 26.0 - 34.0 pg   MCHC  34.7 30.0 - 36.0 g/dL   RDW 19.2 (H) 11.5 - 15.5 %   Platelets 375 150 - 400 K/uL   nRBC 7.6 (H) 0.0 - 0.2 %   Neutrophils Relative % 73 %   Neutro Abs 14.8 (H) 1.7 - 7.7 K/uL   Lymphocytes Relative 16 %   Lymphs Abs 3.2 0.7 - 4.0 K/uL   Monocytes Relative 9 %   Monocytes Absolute 1.9 (H) 0.1 - 1.0 K/uL   Eosinophils Relative 1 %   Eosinophils Absolute 0.3 0.0 - 0.5 K/uL   Basophils Relative 0 %   Basophils Absolute 0.1 0.0 - 0.1 K/uL   WBC Morphology VACUOLATED NEUTROPHILS    Smear Review Normal platelet morphology     Comment: PLATELETS APPEAR ADEQUATE   Immature Granulocytes 1 %   Abs Immature Granulocytes 0.14 (H) 0.00 - 0.07 K/uL   Polychromasia PRESENT    Target Cells PRESENT     Comment: Performed at [facility], [address] [place], [place] [address]  Comprehensive metabolic panel     Status: Abnormal   Collection Time: 10/06/22  1:31 AM  Result Value Ref Range   Sodium 136 135 - 145 mmol/L   Potassium 4.8 3.5 - 5.1 mmol/L   Chloride 95 (L) 98 - 111 mmol/L   CO2 27 22 - 32 mmol/L   Glucose, Bld 108 (H) 70 - 99 mg/dL    Comment: Glucose reference range applies only to samples taken after fasting for at least 8 hours.   BUN 40 (H) 6 - 20 mg/dL   Creatinine, Ser 9.76 (H) 0.44 - 1.00 mg/dL   Calcium 7.7 (L) 8.9 - 10.3 mg/dL   Total Protein 8.1 6.5 - 8.1 g/dL   Albumin 3.5 3.5 - 5.0 g/dL   AST 26 15 - 41 U/L   ALT 18 0 - 44 U/L   Alkaline Phosphatase 100 38 - 126 U/L   Total Bilirubin 0.5 0.3 - 1.2 mg/dL   GFR, Estimated 4 (L) >60 mL/min    Comment: (NOTE) Calculated using the CKD-EPI Creatinine Equation (2021)    Anion gap 14 5 - 15    Comment: Performed at [facility], [address] [place], [place] [address]   CT Angio Chest PE W/Cm &/Or Wo Cm  Result Date: 10/06/2022 CLINICAL DATA:  Shortness of breath for 3 days, history of sickle cell disease and end-stage renal failure EXAM: CT ANGIOGRAPHY CHEST WITH CONTRAST TECHNIQUE:  Multidetector CT imaging of the chest was performed using the standard protocol during bolus administration of intravenous contrast. Multiplanar CT image reconstructions and MIPs were obtained to evaluate the vascular anatomy.  RADIATION DOSE REDUCTION: This exam was performed according to the departmental dose-optimization program which includes automated exposure control, adjustment of the mA and/or kV according to patient size and/or use of iterative reconstruction technique. CONTRAST:  25m OMNIPAQUE IOHEXOL 350 MG/ML SOLN COMPARISON:  Chest x-ray from 2 hours previous FINDINGS: Cardiovascular: Thoracic aorta and its branches demonstrate atherosclerotic calcifications. No aneurysmal dilatation or dissection is noted. No cardiac enlargement is seen. Mild coronary calcifications are noted. The pulmonary artery shows a normal branching pattern bilaterally. No filling defect to suggest pulmonary embolism is noted. Right jugular dialysis catheter is noted in satisfactory position. Multiple left upper extremity venous stents are seen. Mediastinum/Nodes: Thoracic inlet is within normal limits. No hilar or mediastinal adenopathy is noted. The esophagus as visualized is within normal limits. Lungs/Pleura: Lungs are well aerated bilaterally. Mild basilar atelectatic changes are seen. These are stable from the prior plain film examination. No focal confluent infiltrate is noted. No effusion is seen. Upper Abdomen: Visualized upper abdomen shows no acute abnormality. Musculoskeletal: No chest wall abnormality. No acute or significant osseous findings. Review of the MIP images confirms the above findings. IMPRESSION: No evidence of pulmonary emboli. Bibasilar atelectatic changes. Aortic Atherosclerosis (ICD10-I70.0). Electronically Signed   By: MInez CatalinaM.D.   On: 10/06/2022 02:14    Pending Labs Unresulted Labs (From admission, onward)     Start     Ordered   10/06/22 0103  Urinalysis, Routine w reflex microscopic   Once,   URGENT        10/06/22 0102            Vitals/Pain Today's Vitals   10/06/22 0737 10/06/22 1100 10/06/22 1105 10/06/22 1127  BP: 95/60 (!) 93/54    Pulse: 73 77    Resp: (!) 23 11    Temp: 97.7 F (36.5 C)   97.9 F (36.6 C)  TempSrc:    Oral  SpO2: 97% 99%    Weight:      Height:      PainSc: 5   3      Isolation Precautions No active isolations  Medications Medications  HYDROmorphone (DILAUDID) injection 0.5 mg (0.5 mg Intravenous Given 10/06/22 1155)  HYDROmorphone (DILAUDID) injection 1 mg (1 mg Intravenous Given 10/06/22 0133)  iohexol (OMNIPAQUE) 350 MG/ML injection 75 mL (75 mLs Intravenous Contrast Given 10/06/22 0140)  HYDROmorphone (DILAUDID) injection 1 mg (1 mg Intravenous Given 10/06/22 0201)  cefTRIAXone (ROCEPHIN) 1 g in sodium chloride 0.9 % 100 mL IVPB (0 g Intravenous Stopped 10/06/22 0326)  oxyCODONE-acetaminophen (PERCOCET/ROXICET) 5-325 MG per tablet 2 tablet (2 tablets Oral Given 10/06/22 0426)    Mobility walks     Focused Assessments Renal Assessment Handoff:  Hemodialysis Schedule: Hemodialysis Schedule: Monday/Wednesday/Friday Last Hemodialysis date and time: Monday   Restricted appendage: left arm   R Recommendations: See Admitting Provider Note  Report given to:   Additional Notes:  Pt is Alert and oriented x 4. Pt is able to voice needs. Pt is on 2L via nasal cannula.

## 2022-10-06 NOTE — ED Provider Notes (Signed)
[facility] EMERGENCY DEPARTMENT  Provider Note  CSN: [id number] Arrival date & time: 10/06/22 0039  History Chief Complaint  Patient presents with   Shortness of Breath    Julie Wells is a 52 y.o. female with history of sickle cell disease, ESRD on HD MWF, HTN, DM was in the ED on 1/14 for L flank pain. Had a leukocytosis but CXR and CT Abd/pel were neg for source of infection. She had an ovarian cyst, but did not seem to be correlated with her pain. She has continued to have the same L flank pain since then worse with movement and deep breath. This is not similar to previous sickle cell pains. She may have had a fever a few days ago but none since then. She denies any N/V/D. She still make some urine but denies dysuria. She had a UA concerning for infection at recent visit, but given lack of symptoms was sent for culture without treatment. Prelim culture reports is positive for >100k colonies of E.coli.    Home Medications Prior to Admission medications   Medication Sig Start Date End Date Taking? Authorizing Provider  acetaminophen (TYLENOL) 500 MG tablet Take 500-1,000 mg by mouth every 6 (six) hours as needed for mild pain or headache.     [provider]  albuterol (VENTOLIN HFA) 108 (90 Base) MCG/ACT inhaler Inhale 2 puffs into the lungs every 6 (six) hours as needed for wheezing or shortness of breath.    [provider]  atorvastatin (LIPITOR) 40 MG tablet Take 40 mg by mouth daily. 07/03/20   [provider]  B Complex-C-Folic Acid (DIALYVITE TABLET) TABS Take 1 tablet by mouth daily. 09/22/20   [provider]  carvedilol (COREG) 25 MG tablet Take 25 mg by mouth 2 (two) times daily with a meal. 07/12/20   [provider]  cinacalcet (SENSIPAR) 60 MG tablet Take 60 mg by mouth daily.    [provider]  hydroxyurea (HYDREA) 500 MG capsule Take 500 mg by mouth daily. May take with food to minimize GI side effects.     [provider]  lidocaine-prilocaine (EMLA) cream SMARTSIG:Sparingly Topical 3 Times a Week PRN 01/21/21   [provider]  NIFEdipine (ADALAT CC) 30 MG 24 hr tablet Take 30 mg by mouth daily. 05/09/20   [provider]  sevelamer carbonate (RENVELA) 800 MG tablet Take 1,600 mg by mouth 3 (three) times daily with meals. 08/27/20   [provider]     Allergies    Ciprofloxacin, Levaquin [levofloxacin in d5w], and Other   Review of Systems   Review of Systems Please see HPI for pertinent positives and negatives  Physical Exam BP 99/66   Pulse 93   Temp 98.1 F (36.7 C) (Oral)   Resp 14   Ht '5\' 2"'$  (1.575 m)   Wt 69.9 kg   LMP 09/14/2022   SpO2 93%   BMI 28.19 kg/m   Physical Exam Vitals and nursing note reviewed.  Constitutional:      Appearance: Normal appearance.  HENT:     Head: Normocephalic and atraumatic.     Nose: Nose normal.     Mouth/Throat:     Mouth: Mucous membranes are moist.  Eyes:     Extraocular Movements: Extraocular movements intact.     Conjunctiva/sclera: Conjunctivae normal.  Cardiovascular:     Rate and Rhythm: Normal rate.  Pulmonary:     Effort: Pulmonary effort is normal.  Breath sounds: Normal breath sounds.  Chest:     Chest wall: Tenderness (L flank) present.  Abdominal:     General: Abdomen is flat.     Palpations: Abdomen is soft.     Tenderness: There is no abdominal tenderness.  Musculoskeletal:        General: No swelling. Normal range of motion.     Cervical back: Neck supple.  Skin:    General: Skin is warm and dry.     Findings: No rash.  Neurological:     General: No focal deficit present.     Mental Status: She is alert.  Psychiatric:        Mood and Affect: Mood normal.     ED Results / Procedures / Treatments   EKG EKG Interpretation  Date/Time:  Wednesday October 06 2022 00:48:06 EST Ventricular Rate:  90 PR Interval:  148 QRS Duration: 93 QT Interval:  382 QTC  Calculation: 468 R Axis:   43 Text Interpretation: Duplicate Confirmed by Calvert Cantor [phone]) on 10/06/2022 12:51:05 AM  Procedures Procedures  Medications Ordered in the ED Medications  cefTRIAXone (ROCEPHIN) 1 g in sodium chloride 0.9 % 100 mL IVPB (1 g Intravenous New Bag/Given 10/06/22 0251)  HYDROmorphone (DILAUDID) injection 1 mg (1 mg Intravenous Given 10/06/22 0133)  iohexol (OMNIPAQUE) 350 MG/ML injection 75 mL (75 mLs Intravenous Contrast Given 10/06/22 0140)  HYDROmorphone (DILAUDID) injection 1 mg (1 mg Intravenous Given 10/06/22 0201)    Initial Impression and Plan  Patient here with persistent L flank pain, had a positive urine culture from recent visit which could be the cause. Also consider PE or occult pneumonia. She was seen briefly at [facility] ED (taken there by son inadvertently, was meant to be brought back here). CXR there was neg for acute process. Will recheck labs, send for CTA PE. Pain meds for comfort.   ED Course   Clinical Course as of 10/06/22 0258  Wed Oct 06, 2022  0155 CBC with worsening leukocytosis. Anemia is at baseline. CMP with CKD, no hyperkalemia or other concerning findings.  [CS]  0220 I personally viewed the images from radiology studies and agree with radiologist interpretation: CT is neg for PE or other acute findings. Will begin Rocephin for presumed UTI/pyelo as the cause of her pain.  [CS]  2505 Patient reports minimal improvement in pain. Will admit for further management.  [CS]  [phone] Spoke with Dr. Myna Hidalgo, Hospitalist, who will accept for admission.  [CS]    Clinical Course User Index [CS] Truddie Hidden, MD     MDM Rules/Calculators/A&P Medical Decision Making Problems Addressed: ESRD on hemodialysis [facility]): chronic illness or injury Pyelonephritis: acute illness or injury Sickle cell disease with crisis [facility]): chronic illness or injury  Amount and/or Complexity of Data Reviewed Labs: ordered. Decision-making details documented  in ED Course. Radiology: ordered and independent interpretation performed. Decision-making details documented in ED Course. ECG/medicine tests: ordered and independent interpretation performed. Decision-making details documented in ED Course.  Risk Prescription drug management. Parenteral controlled substances. Decision regarding hospitalization.    Final Clinical Impression(s) / ED Diagnoses Final diagnoses:  Pyelonephritis  ESRD on hemodialysis ([place])  Sickle cell disease with crisis [facility])    Rx / DC Orders ED Discharge Orders     None        Truddie Hidden, MD 10/06/22 [phone]

## 2022-10-06 NOTE — H&P (Signed)
History and Physical    Patient: Julie Wells ZOX:[id number] DOB: Jun 16, 1971 DOA: 10/06/2022 DOS: the patient was seen and examined on 10/06/2022 PCP: Merlinda [place], MD  Patient coming from: Home  Chief Complaint: Left Flank pain since this past Sunday  HPI: Julie Wells is a 52 y.o. female with medical history significant of ESRD on HD MWF, HTN, DM-2 on exercise/dietary/lifestyle modification, sickle cell disease, HLD-who presented to the ED for evaluation of the above-noted complaints.  Per patient around late Saturday/early Sunday she started developing significant left flank pain, she also reports having a fever of around 101 F during the same time.  She was seen at [facility] on 1/14, was managed with supportive care and sent home.  She had a urine culture done during that time.  Since going home she has had continued left flank pain-this is gradually worsened over the past several days.  There is no history of nausea vomiting.  She does have a dry cough.  Pain in the left flank is worsened by movement and also by taking deep breaths.  Pain is 10/10 at its worst.  Although she is ESRD she still makes some urine, however denies any dysuria.  Urine culture done on 1/14 is positive for E. coli.  Because of persistent/worsening left flank pain-she represented to [place] today-where a CT angiogram of the chest did not show PE.  She was found to have worsening leukocytosis.  She was thought to have pyelonephritis affecting her left kidney-she was given Rocephin and subsequently referred to [facility] service for further evaluation and treatment.  Fever 101 F several days back No nausea/vomiting/diarrhea Some dry cough Anxious-slightly tachypneic but claims this is due to splinting/left flank pain.  Denies shortness of breath. No dysuria, hematuria, pyuria  Review of Systems: As mentioned in the history of present illness. All other systems reviewed and are negative. Past  Medical History:  Diagnosis Date   Chronic kidney disease    Frequent headaches    Hematuria 06/20/2016   High cholesterol    History of blood transfusion    multiple, no reactions   Hypertension    Migraine    " I constantly have my migraines" (06/23/2016)   Pneumonia    Short-term memory loss    /notes 06/23/2016   Sickle cell disease without crisis ([place]) 03/12/2020   Sickle cell trait ([place])    Stroke ([place]) 2010   short term memory loss/notes 06/23/2016   Type 2 diabetes, diet controlled ([place])    no meds, diet controlled   Past Surgical History:  Procedure Laterality Date   AV FISTULA PLACEMENT Left 12/04/2020   Procedure: LEFT BRACHOBASCILIC  ARTERIOVENOUS (AV) FISTULA CREATION;  Surgeon: [name], MD;  Location: [facility];  Service: Vascular;  Laterality: Left;  PERIPHERAL NERVE BLOCK   AV FISTULA PLACEMENT Left 12/25/2020   Procedure: LEFT UPPER ARM ARTERIOVENOUS LOOP GRAFT PLACEMENT;  Surgeon: [name], MD;  Location: [facility];  Service: Vascular;  Laterality: Left;   CESAREAN SECTION  X 2   COLONOSCOPY  2021   x 3   TUBAL LIGATION     Social History:  reports that she has never smoked. She has never used smokeless tobacco. She reports that she does not drink alcohol and does not use drugs.  Allergies  Allergen Reactions   Levofloxacin Anaphylaxis   Ciprofloxacin Swelling   Levaquin [Levofloxacin In D5w] Hives and Swelling   Other Swelling  Unnamed med caused swelling of the lips (patient cannot recall the indication or name)    History reviewed. No pertinent family history.  Prior to Admission medications   Medication Sig Start Date End Date Taking? Authorizing Provider  acetaminophen (TYLENOL) 500 MG tablet Take 500-1,000 mg by mouth every 6 (six) hours as needed for mild pain or headache.    Yes [provider]  albuterol (VENTOLIN HFA) 108 (90 Base) MCG/ACT inhaler Inhale 2 puffs into the lungs every 6 (six) hours as needed for wheezing or  shortness of breath.   Yes [provider]  atorvastatin (LIPITOR) 40 MG tablet Take 40 mg by mouth daily. 07/03/20  Yes [provider]  B Complex-C-Folic Acid (DIALYVITE TABLET) TABS Take 1 tablet by mouth daily. 09/22/20  Yes [provider]  carvedilol (COREG) 25 MG tablet Take 25 mg by mouth 2 (two) times daily with a meal. 07/12/20  Yes [provider]  Cholecalciferol 50 MCG (2000 UT) CAPS Take 2,000 Units by mouth daily. 04/08/22 04/08/23 Yes [provider]  cinacalcet (SENSIPAR) 30 MG tablet Take 30 mg by mouth daily.   Yes [provider]  hydroxyurea (HYDREA) 500 MG capsule Take 500 mg by mouth daily. May take with food to minimize GI side effects.   Yes [provider]  lidocaine-prilocaine (EMLA) cream Apply 1 Application topically 2 (two) times a week. 01/21/21  Yes [provider]  NIFEdipine (ADALAT CC) 30 MG 24 hr tablet Take 30 mg by mouth daily. 05/09/20  Yes [provider]  Oxycodone HCl 10 MG TABS Take 10 mg by mouth every 6 (six) hours as needed (pain). 09/17/22  Yes [provider]  patiromer (VELTASSA) 8.4 g packet Take 8.4 g by mouth See admin instructions. Take on Sunday and Tuesday. 10/19/21  Yes [provider]  sevelamer carbonate (RENVELA) 800 MG tablet Take 1,600 mg by mouth 3 (three) times daily with meals. 08/27/20  Yes [provider]    Physical Exam: Vitals:   10/06/22 1415 10/06/22 1430 10/06/22 1500 10/06/22 1557  BP:  99/68 112/71 116/64  Pulse: 84   85  Resp:    19  Temp:    98 F (36.7 C)  TempSrc:    Oral  SpO2: 100%   99%  Weight:      Height:       Gen Exam:Alert awake-not in any distress HEENT:atraumatic, normocephalic Chest: B/L clear to auscultation anteriorly CVS:S1S2 regular Abdomen:soft non tender, non distended.  Some tenderness in the left upper flank area on deep palpation.  Left CVA angle is very tender. Extremities:no  edema Neurology: Non focal Skin: no rash  Data Reviewed:    Latest Ref Rng & Units 10/06/2022    1:31 AM 10/03/2022    6:05 PM 12/25/2020    8:09 AM  CBC  WBC 4.0 - 10.5 K/uL 20.4  18.0    Hemoglobin 12.0 - 15.0 g/dL 8.3  8.8  12.2   Hematocrit 36.0 - 46.0 % 23.9  25.2  36.0   Platelets 150 - 400 K/uL 375  388          Latest Ref Rng & Units 10/06/2022    1:31 AM 10/03/2022    6:05 PM 12/25/2020    8:09 AM  BMP  Glucose 70 - 99 mg/dL 108  90  81   BUN 6 - 20 mg/dL 40  38  30   Creatinine 0.44 - 1.00 mg/dL 9.76  10.66  8.00   Sodium 135 - 145 mmol/L 136  136  137   Potassium 3.5 - 5.1 mmol/L 4.8  4.7  4.8   Chloride 98 - 111 mmol/L 95  95  98   CO2 22 - 32 mmol/L 27  27    Calcium 8.9 - 10.3 mg/dL 7.7  7.7       Assessment and Plan: Severe left flank pain-likely due to pyelonephritis involving left kidney-recent urine culture on 1/14 positive for E. coli Afebrile today-but has significant leukocytosis (more than usual baseline) Although she is ESRD still makes some minimal urine CTA chest done 1/17 negative for any acute abnormalities, CT abdomen done on 1/14 did not show any major abnormalities as well. Plan is to treat with IV antibiotics, as needed narcotics/muscle relaxants and see how she does If she fails to respond/improve-we can contemplate further workup/change in plans Note-per patient-her usual sickle cell crisis involves leg cramps/leg pain-and this current left flank pain is not consistent with her usual sickle cell crisis.  ESRD on HD MWF Will consult nephrology for hemodialysis Currently no acute indications-electrolytes stable  Normocytic anemia Due to combination of ESRD and sickle cell disease Follow closely-no indication to transfuse  HTN Resume Coreg/nifedipine Follow BP trend  HLD Continue statin  Sickle cell disease Does not appear to be in crisis-as this current clinical presentation is not very typical of her usual sickle cell crisis pain Will  monitor for now  Bilateral ovarian cyst Seen incidentally on CT abdomen on 1/14 Likely not the cause for her left flank pain Outpatient follow-up with PCP/GYN   Advance Care Planning:   Code Status: Full Code   Consults: Nephrology  Family Communication: Spouse/Son at bedside  Severity of Illness: The appropriate patient status for this patient is OBSERVATION. Observation status is judged to be reasonable and necessary in order to provide the required intensity of service to ensure the patient's safety. The patient's presenting symptoms, physical exam findings, and initial radiographic and laboratory data in the context of their medical condition is felt to place them at decreased risk for further clinical deterioration. Furthermore, it is anticipated that the patient will be medically stable for discharge from the hospital within 2 midnights of admission.   Author: Oren Binet, MD 10/06/2022 5:38 PM  For on call review [email or web address].

## 2022-10-06 NOTE — ED Notes (Signed)
To Ct 

## 2022-10-06 NOTE — ED Triage Notes (Addendum)
Patient arrives today for shortness of breath that started on Sunday. Breathing is worse on exertion and movement. Patient does not wear oxygen at home. Patient receives dialysis on MWF. Denies chest pain, N/V. Also complains on of chronic left side flank pain rated 8/10. Patient was seen here on Sunday.

## 2022-10-06 NOTE — ED Notes (Signed)
Patient reports that her pain is better. Patient was started on 2L nasal cannula due to oxygen saturation being 90-91 on room air%.

## 2022-10-06 NOTE — ED Notes (Signed)
Patient is back from CT. Pain remains the same .

## 2022-10-06 NOTE — ED Notes (Signed)
Pt requested oatmeal, apple sauce and diet twist up. OK per Dr Karle Starch pt given the same.  Pt states she does not make much urine.

## 2022-10-06 NOTE — Progress Notes (Signed)
NEW ADMISSION NOTE New Admission Note: Patient to room on 2 liters of O2 with sats 100%. Today is patient normal dialysis day. Alert and oriented. Call light within reach.  Arrival Method: stretcher with EMS Mental Orientation: A & O x 4 Telemetry: Assessment: Completed Skin: Intact IV: Rt AC Pain: 0/10 Tubes: Safety Measures: Safety Fall Prevention Plan has been given, discussed and implemented. Admission: Completed [facility] Orientation: Patient has been orientated to the room, unit and staff.  Family:  Orders have been reviewed and implemented. Will continue to monitor the patient. Call light has been placed within reach and bed alarm has been activated.   Keenan Bachelor, RN

## 2022-10-06 NOTE — Progress Notes (Signed)
Plan of Care Note for accepted transfer   Patient: Julie Wells MRN: [id number]   DOA: 10/06/2022  Facility requesting transfer: [facility]   Requesting Provider: Dr. Karle Starch   Reason for transfer: intractable pain, suspected UTI   Facility course: 52 yr old lady with ESRD who presents with SOB and pleuritic pain in left side. She is afebrile with stable vitals on rm air. CTA is negative for PE or other acute finding. Urine culture from 1/14 is growing E coli (sensitivities pending) and ED physician suspects she may have left-sided pyelonephritis. She was given Rocephin and 2 doses of IV Dilaudid in the ED.   Plan of care: The patient is accepted for admission to [place]  unit, at [facility].   Author: Vianne Bulls, MD 10/06/2022  Check [email or web address] for on-call coverage.  Nursing staff, Please call [place] number on Amion as soon as patient's arrival, so appropriate admitting provider can evaluate the pt.

## 2022-10-06 NOTE — ED Notes (Signed)
Pt given ice chips and some crackers with provider okay.

## 2022-10-06 NOTE — Consult Note (Signed)
Reason for Consult: ESRD  Referring Physician: Dr. Sloan Leiter  Chief Complaint: Flank pain  Dialysis orders [facility] MWF 3hr 32mn RIJ TC, failed LUA loop 180NRe 400/500 EDW 74kg (left at 74.1 on 1/15) 1/2/2.5 UFR profile 2 Mircera 150 q2weeks (last given 1/10) Hectorol 21m  Assessment/Plan: Left sided pyelonephritis treated with Rocephin; positive for E.Coli on urine culture. Cough with rales on the left side - requested a repeat CXR (last one on 1/14) and pt has had a similar presentation a few years ago with ultimately a PNA being diagnosed and successfully treated. ESRD on MWF regimen, missed treatment today; plan on dialyzing tonight given full schedule tomorrow. Renal osteodystrophy - last P4.1 on 1/3 on Renvela 1-2 TIDM Anemia - has chronic anemia from ESRD being treated with Mircera; does not appear to be in a sickle crisis at this time although she does sometimes have crisis with severe cold weather. Sickle Cell Disease - again, does not appear to be in crisis.    HPI: Julie LOZANOs an 89.o. female HTN, DM, SSD, HLD, ESRD MWF presenting with left sided flank pain which started early Sunday with fever of 101. She was able to tolerate a dialysis treatment on Monday but unfortunately the pain worsened over the past few days and the pain in the left flank is worse with movement and deep inspirational efforts. She denies dysuria and urine culture done on 1/14 was positive for E. Coli. Of note [facility] but has had worsening leukocytosis. [facility] She denies nausea, vomiting, diarrhea but has had a dry cough productive of only whitish sputum; she denies rigors or chills, sore throat or sinus drainage.  In the ED u/a showed 6-10 WBC's, few bacteria, 0-5 RBC's, BP 107-116/60's HR 80's, T98. WBC 20.4. CXR mid bibasilar atelectasis but done on 1/14.   ROS Pertinent items are noted in  HPI.  Chemistry and CBC: Creatinine, Ser  Date/Time Value Ref Range Status  10/06/2022 01:31 AM 9.76 (H) 0.44 - 1.00 mg/dL Final  10/03/2022 06:05 PM 10.66 (H) 0.44 - 1.00 mg/dL Final  12/25/2020 08:09 AM 8.00 (H) 0.44 - 1.00 mg/dL Final  12/04/2020 06:33 AM 7.30 (H) 0.44 - 1.00 mg/dL Final  06/24/2016 05:35 AM 3.14 (H) 0.44 - 1.00 mg/dL Final  06/23/2016 05:35 AM 3.30 (H) 0.44 - 1.00 mg/dL Final  06/22/2016 04:49 AM 3.64 (H) 0.44 - 1.00 mg/dL Final  06/21/2016 07:34 AM 3.76 (H) 0.44 - 1.00 mg/dL Final  06/20/2016 11:25 AM 4.13 (H) 0.44 - 1.00 mg/dL Final  04/25/2016 06:25 PM 3.18 (H) 0.44 - 1.00 mg/dL Final  02/13/2015 05:26 PM 2.16 (H) 0.44 - 1.00 mg/dL Final  03/21/2014 05:15 PM 1.90 (H) 0.50 - 1.10 mg/dL Final   Recent Labs  Lab 10/03/22 1805 10/06/22 0131  NA 136 136  K 4.7 4.8  CL 95* 95*  CO2 27 27  GLUCOSE 90 108*  BUN 38* 40*  CREATININE 10.66* 9.76*  CALCIUM 7.7* 7.7*   Recent Labs  Lab 10/03/22 1805 10/06/22 0131  WBC 18.0* 20.4*  NEUTROABS 12.0* 14.8*  HGB 8.8* 8.3*  HCT 25.2* 23.9*  MCV 105.9* 106.2*  PLT 388 375   Liver Function Tests: Recent Labs  Lab 10/03/22 1805 10/06/22 0131  AST 15 26  ALT 12 18  ALKPHOS 95 100  BILITOT 0.5 0.5  PROT 7.7 8.1  ALBUMIN  3.6 3.5   No results for input(s): "LIPASE", "AMYLASE" in the last 168 hours. No results for input(s): "AMMONIA" in the last 168 hours. Cardiac Enzymes: No results for input(s): "CKTOTAL", "CKMB", "CKMBINDEX", "TROPONINI" in the last 168 hours. Iron Studies: No results for input(s): "IRON", "TIBC", "TRANSFERRIN", "FERRITIN" in the last 72 hours. PT/INR: '@LABRCNTIP'$ (inr:5)  Xrays/Other Studies: ) Results for orders placed or performed during the hospital encounter of 10/06/22 (from the past 48 hour(s))  CBC with Differential     Status: Abnormal   Collection Time: 10/06/22  1:31 AM  Result Value Ref Range   WBC 20.4 (H) 4.0 - 10.5 K/uL    Comment: REPEATED TO VERIFY WHITE COUNT  CONFIRMED ON SMEAR    RBC 2.25 (L) 3.87 - 5.11 MIL/uL   Hemoglobin 8.3 (L) 12.0 - 15.0 g/dL   HCT 23.9 (L) 36.0 - 46.0 %   MCV 106.2 (H) 80.0 - 100.0 fL   MCH 36.9 (H) 26.0 - 34.0 pg   MCHC 34.7 30.0 - 36.0 g/dL   RDW 19.2 (H) 11.5 - 15.5 %   Platelets 375 150 - 400 K/uL   nRBC 7.6 (H) 0.0 - 0.2 %   Neutrophils Relative % 73 %   Neutro Abs 14.8 (H) 1.7 - 7.7 K/uL   Lymphocytes Relative 16 %   Lymphs Abs 3.2 0.7 - 4.0 K/uL   Monocytes Relative 9 %   Monocytes Absolute 1.9 (H) 0.1 - 1.0 K/uL   Eosinophils Relative 1 %   Eosinophils Absolute 0.3 0.0 - 0.5 K/uL   Basophils Relative 0 %   Basophils Absolute 0.1 0.0 - 0.1 K/uL   WBC Morphology VACUOLATED NEUTROPHILS    Smear Review Normal platelet morphology     Comment: PLATELETS APPEAR ADEQUATE   Immature Granulocytes 1 %   Abs Immature Granulocytes 0.14 (H) 0.00 - 0.07 K/uL   Polychromasia PRESENT    Target Cells PRESENT     Comment: Performed at [facility], [address] [place], [place] [address]  Comprehensive metabolic panel     Status: Abnormal   Collection Time: 10/06/22  1:31 AM  Result Value Ref Range   Sodium 136 135 - 145 mmol/L   Potassium 4.8 3.5 - 5.1 mmol/L   Chloride 95 (L) 98 - 111 mmol/L   CO2 27 22 - 32 mmol/L   Glucose, Bld 108 (H) 70 - 99 mg/dL    Comment: Glucose reference range applies only to samples taken after fasting for at least 8 hours.   BUN 40 (H) 6 - 20 mg/dL   Creatinine, Ser 9.76 (H) 0.44 - 1.00 mg/dL   Calcium 7.7 (L) 8.9 - 10.3 mg/dL   Total Protein 8.1 6.5 - 8.1 g/dL   Albumin 3.5 3.5 - 5.0 g/dL   AST 26 15 - 41 U/L   ALT 18 0 - 44 U/L   Alkaline Phosphatase 100 38 - 126 U/L   Total Bilirubin 0.5 0.3 - 1.2 mg/dL   GFR, Estimated 4 (L) >60 mL/min    Comment: (NOTE) Calculated using the CKD-EPI Creatinine Equation (2021)    Anion gap 14 5 - 15    Comment: Performed at [facility], [address] [place], [place] [address]  Urinalysis, Routine w reflex  microscopic Urine, Clean Catch     Status: Abnormal   Collection Time: 10/06/22  1:17 PM  Result Value Ref Range   Color, Urine YELLOW YELLOW   APPearance HAZY (A)  CLEAR   Specific Gravity, Urine 1.015 1.005 - 1.030   pH 8.5 (H) 5.0 - 8.0   Glucose, UA NEGATIVE NEGATIVE mg/dL   Hgb urine dipstick SMALL (A) NEGATIVE   Bilirubin Urine NEGATIVE NEGATIVE   Ketones, ur NEGATIVE NEGATIVE mg/dL   Protein, ur 100 (A) NEGATIVE mg/dL   Nitrite NEGATIVE NEGATIVE   Leukocytes,Ua SMALL (A) NEGATIVE    Comment: Performed at [facility], [address] [place], [place] [address]  Urinalysis, Microscopic (reflex)     Status: Abnormal   Collection Time: 10/06/22  1:17 PM  Result Value Ref Range   RBC / HPF 0-5 0 - 5 RBC/hpf   WBC, UA 6-10 0 - 5 WBC/hpf   Bacteria, UA FEW (A) NONE SEEN   Squamous Epithelial / HPF 6-10 0 - 5 /HPF    Comment: Performed at [facility], [address] [place], [place] [address]   CT Angio Chest PE W/Cm &/Or Wo Cm  Result Date: 10/06/2022 CLINICAL DATA:  Shortness of breath for 3 days, history of sickle cell disease and end-stage renal failure EXAM: CT ANGIOGRAPHY CHEST WITH CONTRAST TECHNIQUE: Multidetector CT imaging of the chest was performed using the standard protocol during bolus administration of intravenous contrast. Multiplanar CT image reconstructions and MIPs were obtained to evaluate the vascular anatomy. RADIATION DOSE REDUCTION: This exam was performed according to the departmental dose-optimization program which includes automated exposure control, adjustment of the mA and/or kV according to patient size and/or use of iterative reconstruction technique. CONTRAST:  24m OMNIPAQUE IOHEXOL 350 MG/ML SOLN COMPARISON:  Chest x-ray from 2 hours previous FINDINGS: Cardiovascular: Thoracic aorta and its branches demonstrate atherosclerotic calcifications. No aneurysmal dilatation or dissection is noted. No cardiac enlargement is seen. Mild  coronary calcifications are noted. The pulmonary artery shows a normal branching pattern bilaterally. No filling defect to suggest pulmonary embolism is noted. Right jugular dialysis catheter is noted in satisfactory position. Multiple left upper extremity venous stents are seen. Mediastinum/Nodes: Thoracic inlet is within normal limits. No hilar or mediastinal adenopathy is noted. The esophagus as visualized is within normal limits. Lungs/Pleura: Lungs are well aerated bilaterally. Mild basilar atelectatic changes are seen. These are stable from the prior plain film examination. No focal confluent infiltrate is noted. No effusion is seen. Upper Abdomen: Visualized upper abdomen shows no acute abnormality. Musculoskeletal: No chest wall abnormality. No acute or significant osseous findings. Review of the MIP images confirms the above findings. IMPRESSION: No evidence of pulmonary emboli. Bibasilar atelectatic changes. Aortic Atherosclerosis (ICD10-I70.0). Electronically Signed   By: MInez CatalinaM.D.   On: 10/06/2022 02:14    PMH:   Past Medical History:  Diagnosis Date   Chronic kidney disease    Frequent headaches    Hematuria 06/20/2016   High cholesterol    History of blood transfusion    multiple, no reactions   Hypertension    Migraine    " I constantly have my migraines" (06/23/2016)   Pneumonia    Short-term memory loss    /notes 06/23/2016   [place] without crisis [place] 03/12/2020   S[place]le cell trait ([facility]    [place] ([facility]   short term memory loss/notes 06/23/2016   Type 2 diabetes, diet controlled ([facility]    no meds, diet controlled    PSH:   Past Surgical History:  Procedure Laterality Date   AV FISTULA PLACEMENT Left 12/04/2020   Procedure: LEFT BRACHOBASCILIC  ARTERIOVENOUS (AV) FISTULA  CREATION;  Surgeon: [name], MD;  Location[facility] OR;  Service: Vascular;  Laterality: Left;  PERIPHERAL NERVE BLOCK   AV FISTULA PLACEMENT Left 12/25/2020   Procedure: LEFT  UPPER ARM ARTERIOVENOUS LOOP GRAFT PLACEMENT;  Surgeon: [name], MD;  Location: [facility] OR;  Service: Vascular;  Laterality: Left;   CESAREAN SECTION  X 2   COLONOSCOPY  2021   x 3   TUBAL LIGATION      Allergies:  Allergies  Allergen Reactions   Levofloxacin Anaphylaxis   Ciprofloxacin Swelling   Levaquin [Levofloxacin In D5w] Hives and Swelling   Other Swelling    Unnamed med caused swelling of the lips (patient cannot recall the indication or name)    Medications:   Prior to Admission medications   Medication Sig Start Date End Date Taking? Authorizing Provider  acetaminophen (TYLENOL) 500 MG tablet Take 500-1,000 mg by mouth every 6 (six) hours as needed for mild pain or headache.    Yes [provider]  albuterol (VENTOLIN HFA) 108 (90 Base) MCG/ACT inhaler Inhale 2 puffs into the lungs every 6 (six) hours as needed for wheezing or shortness of breath.   Yes [provider]  atorvastatin (LIPITOR) 40 MG tablet Take 40 mg by mouth daily. 07/03/20  Yes [provider]  B Complex-C-Folic Acid (DIALYVITE TABLET) TABS Take 1 tablet by mouth daily. 09/22/20  Yes [provider]  carvedilol (COREG) 25 MG tablet Take 25 mg by mouth 2 (two) times daily with a meal. 07/12/20  Yes [provider]  Cholecalciferol 50 MCG (2000 UT) CAPS Take 2,000 Units by mouth daily. 04/08/22 04/08/23 Yes [provider]  cinacalcet (SENSIPAR) 30 MG tablet Take 30 mg by mouth daily.   Yes [provider]  hydroxyurea (HYDREA) 500 MG capsule Take 500 mg by mouth daily. May take with food to minimize GI side effects.   Yes [provider]  lidocaine-prilocaine (EMLA) cream Apply 1 Application topically 2 (two) times a week. 01/21/21  Yes [provider]  NIFEdipine (ADALAT CC) 30 MG 24 hr tablet Take 30 mg by mouth daily. 05/09/20  Yes [provider]  Oxycodone HCl 10 MG TABS Take 10 mg by mouth every 6 (six) hours as needed  (pain). 09/17/22  Yes [provider]  patiromer (VELTASSA) 8.4 g packet Take 8.4 g by mouth See admin instructions. Take on Sunday and Tuesday. 10/19/21  Yes [provider]  sevelamer carbonate (RENVELA) 800 MG tablet Take 1,600 mg by mouth 3 (three) times daily with meals. 08/27/20  Yes [provider]    Discontinued Meds:   Medications Discontinued During This Encounter  Medication Reason   HYDROmorphone (DILAUDID) injection 0.5 mg     Social History:  reports that she has never smoked. She has never used smokeless tobacco. She reports that she does not drink alcohol and does not use drugs.  Family History:  History reviewed. No pertinent family history.  Blood pressure 116/64, pulse 85, temperature 98 F (36.7 C), temperature source Oral, resp. rate 19, height '5\' 2"'$  (1.575 m), weight 69.9 kg, last menstrual period 09/14/2022, SpO2 99 %. General appearance: alert, cooperative, and appears stated age Head: Normocephalic, without obvious abnormality, atraumatic Eyes: negative Back: tenderness on left flank only Resp: rales LLL Cardio: regular rate and rhythm GI: soft, non-tender; bowel sounds normal; no masses,  no organomegaly Extremities: edema tr Pulses: 2+ and symmetric Skin: Skin color, texture, turgor normal. No rashes or lesions  Access: RIJ TC, LUA loop AVG thrombosed       Miquan Tandon, Hunt Oris, MD 10/06/2022, 6:19 PM

## 2022-10-07 DIAGNOSIS — R52 Pain, unspecified: Secondary | ICD-10-CM | POA: Diagnosis not present

## 2022-10-07 DIAGNOSIS — Z1152 Encounter for screening for COVID-19: Secondary | ICD-10-CM | POA: Diagnosis not present

## 2022-10-07 DIAGNOSIS — D631 Anemia in chronic kidney disease: Secondary | ICD-10-CM | POA: Diagnosis not present

## 2022-10-07 DIAGNOSIS — I120 Hypertensive chronic kidney disease with stage 5 chronic kidney disease or end stage renal disease: Secondary | ICD-10-CM | POA: Diagnosis present

## 2022-10-07 DIAGNOSIS — D571 Sickle-cell disease without crisis: Secondary | ICD-10-CM | POA: Diagnosis not present

## 2022-10-07 DIAGNOSIS — J9811 Atelectasis: Secondary | ICD-10-CM | POA: Diagnosis present

## 2022-10-07 DIAGNOSIS — J189 Pneumonia, unspecified organism: Secondary | ICD-10-CM | POA: Diagnosis not present

## 2022-10-07 DIAGNOSIS — E7800 Pure hypercholesterolemia, unspecified: Secondary | ICD-10-CM | POA: Diagnosis present

## 2022-10-07 DIAGNOSIS — Z992 Dependence on renal dialysis: Secondary | ICD-10-CM | POA: Diagnosis not present

## 2022-10-07 DIAGNOSIS — N12 Tubulo-interstitial nephritis, not specified as acute or chronic: Secondary | ICD-10-CM | POA: Diagnosis not present

## 2022-10-07 DIAGNOSIS — N186 End stage renal disease: Secondary | ICD-10-CM | POA: Diagnosis not present

## 2022-10-07 DIAGNOSIS — Y712 Prosthetic and other implants, materials and accessory cardiovascular devices associated with adverse incidents: Secondary | ICD-10-CM | POA: Diagnosis not present

## 2022-10-07 DIAGNOSIS — N83201 Unspecified ovarian cyst, right side: Secondary | ICD-10-CM | POA: Diagnosis present

## 2022-10-07 DIAGNOSIS — E669 Obesity, unspecified: Secondary | ICD-10-CM | POA: Diagnosis present

## 2022-10-07 DIAGNOSIS — D638 Anemia in other chronic diseases classified elsewhere: Secondary | ICD-10-CM | POA: Diagnosis present

## 2022-10-07 DIAGNOSIS — I69311 Memory deficit following cerebral infarction: Secondary | ICD-10-CM | POA: Diagnosis not present

## 2022-10-07 DIAGNOSIS — E1122 Type 2 diabetes mellitus with diabetic chronic kidney disease: Secondary | ICD-10-CM | POA: Diagnosis present

## 2022-10-07 DIAGNOSIS — Z881 Allergy status to other antibiotic agents status: Secondary | ICD-10-CM | POA: Diagnosis not present

## 2022-10-07 DIAGNOSIS — Z6832 Body mass index (BMI) 32.0-32.9, adult: Secondary | ICD-10-CM | POA: Diagnosis not present

## 2022-10-07 DIAGNOSIS — N83202 Unspecified ovarian cyst, left side: Secondary | ICD-10-CM | POA: Diagnosis present

## 2022-10-07 DIAGNOSIS — B9620 Unspecified Escherichia coli [E. coli] as the cause of diseases classified elsewhere: Secondary | ICD-10-CM | POA: Diagnosis present

## 2022-10-07 DIAGNOSIS — N250 Renal osteodystrophy: Secondary | ICD-10-CM | POA: Diagnosis present

## 2022-10-07 DIAGNOSIS — R109 Unspecified abdominal pain: Secondary | ICD-10-CM | POA: Diagnosis present

## 2022-10-07 DIAGNOSIS — T82868A Thrombosis of vascular prosthetic devices, implants and grafts, initial encounter: Secondary | ICD-10-CM | POA: Diagnosis not present

## 2022-10-07 DIAGNOSIS — E875 Hyperkalemia: Secondary | ICD-10-CM | POA: Diagnosis not present

## 2022-10-07 DIAGNOSIS — N289 Disorder of kidney and ureter, unspecified: Secondary | ICD-10-CM | POA: Diagnosis present

## 2022-10-07 DIAGNOSIS — Z79899 Other long term (current) drug therapy: Secondary | ICD-10-CM | POA: Diagnosis not present

## 2022-10-07 DIAGNOSIS — D5700 Hb-SS disease with crisis, unspecified: Secondary | ICD-10-CM | POA: Diagnosis present

## 2022-10-07 LAB — RENAL FUNCTION PANEL
Albumin: 3 g/dL — ABNORMAL LOW (ref 3.5–5.0)
Anion gap: 18 — ABNORMAL HIGH (ref 5–15)
BUN: 48 mg/dL — ABNORMAL HIGH (ref 6–20)
CO2: 25 mmol/L (ref 22–32)
Calcium: 7.8 mg/dL — ABNORMAL LOW (ref 8.9–10.3)
Chloride: 93 mmol/L — ABNORMAL LOW (ref 98–111)
Creatinine, Ser: 11.94 mg/dL — ABNORMAL HIGH (ref 0.44–1.00)
GFR, Estimated: 3 mL/min — ABNORMAL LOW (ref >60)
Glucose, Bld: 103 mg/dL — ABNORMAL HIGH (ref 70–99)
Phosphorus: 6.7 mg/dL — ABNORMAL HIGH (ref 2.5–4.6)
Potassium: 5 mmol/L (ref 3.5–5.1)
Sodium: 136 mmol/L (ref 135–145)

## 2022-10-07 LAB — CBC
HCT: 21.3 % — ABNORMAL LOW (ref 36.0–46.0)
Hemoglobin: 7.8 g/dL — ABNORMAL LOW (ref 12.0–15.0)
MCH: 37.7 pg — ABNORMAL HIGH (ref 26.0–34.0)
MCHC: 36.6 g/dL — ABNORMAL HIGH (ref 30.0–36.0)
MCV: 102.9 fL — ABNORMAL HIGH (ref 80.0–100.0)
Platelets: 335 10*3/uL (ref 150–400)
RBC: 2.07 MIL/uL — ABNORMAL LOW (ref 3.87–5.11)
RDW: 18.7 % — ABNORMAL HIGH (ref 11.5–15.5)
WBC: 16.7 10*3/uL — ABNORMAL HIGH (ref 4.0–10.5)
nRBC: 7.8 % — ABNORMAL HIGH (ref 0.0–0.2)

## 2022-10-07 LAB — GLUCOSE, CAPILLARY
Glucose-Capillary: 74 mg/dL (ref 70–99)
Glucose-Capillary: 74 mg/dL (ref 70–99)
Glucose-Capillary: 73 mg/dL (ref 70–99)
Glucose-Capillary: 101 mg/dL — ABNORMAL HIGH (ref 70–99)

## 2022-10-07 MED ORDER — ALTEPLASE 2 MG IJ SOLR
INTRAMUSCULAR | Status: AC
  Filled 2022-10-07: qty 4

## 2022-10-07 MED ORDER — HEPARIN SODIUM (PORCINE) 1000 UNIT/ML IJ SOLN
INTRAMUSCULAR | Status: AC
  Filled 2022-10-07: qty 4

## 2022-10-07 MED ORDER — ALTEPLASE 2 MG IJ SOLR: 2.0000 mg | Freq: Once | INTRAMUSCULAR | Status: DC | PRN

## 2022-10-07 MED ORDER — CARVEDILOL 12.5 MG PO TABS
12.5000 mg | ORAL_TABLET | Freq: Two times a day (BID) | ORAL | Status: DC
  Administered 2022-10-07: 12.5 mg via ORAL
  Filled 2022-10-07: qty 1

## 2022-10-07 NOTE — Progress Notes (Signed)
Spoke with provider d/t cath performance. HD tx ended early d/t unable to maintain a bfr of 200.Pt is scheduled for catheter replacement and is npo for procedure. Pt notified.

## 2022-10-07 NOTE — Progress Notes (Addendum)
PROGRESS NOTE        PATIENT DETAILS Name: Julie Wells Age: 52 y.o. Sex: female Date of Birth: 10/04/1970 Admit Date: 10/06/2022 Admitting Physician Vianne Bulls, MD PJA:SNKNL, Merlyn Albert, MD  Brief Summary: Patient is a 52 y.o.  female with history of ESRD on HD MWF, HTN, DM-2, sickle cell disease, HLD-presented with left flank pain (second ED visit)-thought to have pyelonephritis and subsequently admitted to the hospitalist service.  Significant events: 1/14>> presented to [facility] with left flank pain-manage medically-discharge home. 1/17>> presented to [facility]-worsening left flank pain-urine culture on 1/14 positive for E. coli-admit to [facility].    Significant studies: 1/14>> CT abdomen/pelvis: 3.8 cm left and 3.7 cm right ovarian cyst.  Bilateral atrophic kidneys. 1/17>> CT angio chest: No PE, bibasilar atelectatic changes.  Significant microbiology data: 1/14>> urine culture: E. coli  Procedures: None  Consults: Nephrology  Subjective: Thinks pain in the left flank area is slowly easing off-but still with some ongoing pain.  Objective: Vitals: Blood pressure 96/61, pulse 81, temperature 97.7 F (36.5 C), temperature source Oral, resp. rate 11, height '5\' 2"'$  (1.575 m), weight 78.3 kg, last menstrual period 09/14/2022, SpO2 100 %.   Exam: Gen Exam:Alert awake-not in any distress HEENT:atraumatic, normocephalic Chest: B/L clear to auscultation anteriorly CVS:S1S2 regular Abdomen:soft non tender, non distended.  Left CVA angle continues to be tender. Extremities:no edema Neurology: Non focal Skin: no rash  Pertinent Labs/Radiology:    Latest Ref Rng & Units 10/07/2022    7:55 AM 10/06/2022    6:56 PM 10/06/2022    1:31 AM  CBC  WBC 4.0 - 10.5 K/uL PENDING  16.0  20.4   Hemoglobin 12.0 - 15.0 g/dL 7.8  8.2  8.3   Hematocrit 36.0 - 46.0 % 21.3  23.0  23.9   Platelets 150 - 400 K/uL 335  344  375     Lab Results  Component Value Date   NA 136  10/07/2022   K 5.0 10/07/2022   CL 93 (L) 10/07/2022   CO2 25 10/07/2022      Assessment/Plan: Severe left flank pain-likely due to pyelonephritis involving left kidney-recent urine culture on 1/14 positive for E. coli Continues to have left flank pain although slightly better today Afebrile overnight WBC count pending in a.m. labs today CT imaging as above-no significant abnormality noted. Presumed to have left-sided pyelonephritis-somewhat better today-but still with intense pain requiring narcotics for pain control.  Plan is to monitor closely-if no clinical improvement over the next few days-probably will need further workup. Continue IV antibiotics/as needed narcotics/muscle relaxants in the interim.    ESRD on HD MWF Nephrology following for HD needs.   Normocytic anemia Due to combination of ESRD and sickle cell disease Follow closely-no indication to transfuse   HTN Stable-continue Coreg/nifedipine.  Addendum: BP soft-has been getting narcotics.  Hold Nifedipine, cut coreg to 12.5 mg BID Reassess tomorrow.   HLD Continue statin   Sickle cell disease Does not appear to be in crisis-as this current clinical presentation is not very typical of her usual sickle cell crisis pain Will monitor for now   Bilateral ovarian cyst Seen incidentally on CT abdomen on 1/14 Likely not the cause for her left flank pain Outpatient follow-up with PCP/GYN  Obesity: Estimated body mass index is 31.57 kg/m as calculated from the following:   Height  as of this encounter: '5\' 2"'$  (1.575 m).   Weight as of this encounter: 78.3 kg.   Code status:   Code Status: Full Code   DVT Prophylaxis: heparin injection 5,000 Units Start: 10/06/22 2200   Family Communication: Spouse at bedside   Disposition Plan: Status is: Observation The patient will require care spanning > 2 midnights and should be moved to inpatient because: Severity of illness   Planned Discharge  Destination:Home   Diet: Diet Order             Diet heart healthy/carb modified Room service appropriate? Yes; Fluid consistency: Thin  Diet effective now                     Antimicrobial agents: Anti-infectives (From admission, onward)    Start     Dose/Rate Route Frequency Ordered Stop   10/06/22 2200  cefTRIAXone (ROCEPHIN) 2 g in sodium chloride 0.9 % 100 mL IVPB        2 g 200 mL/hr over 30 Minutes Intravenous Every 24 hours 10/06/22 1732     10/06/22 0230  cefTRIAXone (ROCEPHIN) 1 g in sodium chloride 0.9 % 100 mL IVPB        1 g 200 mL/hr over 30 Minutes Intravenous  Once 10/06/22 0220 10/06/22 0326        MEDICATIONS: Scheduled Meds:  atorvastatin  40 mg Oral Daily   carvedilol  25 mg Oral BID WC   Chlorhexidine Gluconate Cloth  6 each Topical Q0600   cholecalciferol  2,000 Units Oral Daily   cinacalcet  30 mg Oral Daily   heparin  5,000 Units Subcutaneous Q8H   hydroxyurea  500 mg Oral Daily   insulin aspart  0-9 Units Subcutaneous TID WC   multivitamin  1 tablet Oral Daily   NIFEdipine  30 mg Oral Daily   [START ON 10/10/2022] patiromer  8.4 g Oral Once per day on Sun Tue   sevelamer carbonate  1,600 mg Oral TID WC   sodium chloride flush  3 mL Intravenous Q12H   Continuous Infusions:  sodium chloride     cefTRIAXone (ROCEPHIN)  IV 2 g (10/07/22 0254)   PRN Meds:.sodium chloride, acetaminophen **OR** acetaminophen, alteplase, cyclobenzaprine, hydrALAZINE, HYDROmorphone (DILAUDID) injection, ondansetron **OR** ondansetron (ZOFRAN) IV, oxyCODONE, polyethylene glycol, sodium chloride flush   I have personally reviewed following labs and imaging studies  LABORATORY DATA: CBC: Recent Labs  Lab 10/03/22 1805 10/06/22 0131 10/06/22 1856 10/07/22 0755  WBC 18.0* 20.4* 16.0* PENDING  NEUTROABS 12.0* 14.8*  --   --   HGB 8.8* 8.3* 8.2* 7.8*  HCT 25.2* 23.9* 23.0* 21.3*  MCV 105.9* 106.2* 105.0* 102.9*  PLT 388 375 344 696    Basic Metabolic  Panel: Recent Labs  Lab 10/03/22 1805 10/06/22 0131 10/06/22 1856 10/07/22 0755  NA 136 136  --  136  K 4.7 4.8  --  5.0  CL 95* 95*  --  93*  CO2 27 27  --  25  GLUCOSE 90 108*  --  103*  BUN 38* 40*  --  48*  CREATININE 10.66* 9.76* 11.04* 11.94*  CALCIUM 7.7* 7.7*  --  7.8*  PHOS  --   --   --  6.7*    GFR: Estimated Creatinine Clearance: 5.3 mL/min (A) (by C-G formula based on SCr of 11.94 mg/dL (H)).  Liver Function Tests: Recent Labs  Lab 10/03/22 1805 10/06/22 0131 10/07/22 0755  AST 15 26  --  ALT 12 18  --   ALKPHOS 95 100  --   BILITOT 0.5 0.5  --   PROT 7.7 8.1  --   ALBUMIN 3.6 3.5 3.0*   No results for input(s): "LIPASE", "AMYLASE" in the last 168 hours. No results for input(s): "AMMONIA" in the last 168 hours.  Coagulation Profile: No results for input(s): "INR", "PROTIME" in the last 168 hours.  Cardiac Enzymes: No results for input(s): "CKTOTAL", "CKMB", "CKMBINDEX", "TROPONINI" in the last 168 hours.  BNP (last 3 results) No results for input(s): "PROBNP" in the last 8760 hours.  Lipid Profile: No results for input(s): "CHOL", "HDL", "LDLCALC", "TRIG", "CHOLHDL", "LDLDIRECT" in the last 72 hours.  Thyroid Function Tests: No results for input(s): "TSH", "T4TOTAL", "FREET4", "T3FREE", "THYROIDAB" in the last 72 hours.  Anemia Panel: No results for input(s): "VITAMINB12", "FOLATE", "FERRITIN", "TIBC", "IRON", "RETICCTPCT" in the last 72 hours.  Urine analysis:    Component Value Date/Time   COLORURINE YELLOW 10/06/2022 1317   APPEARANCEUR HAZY (A) 10/06/2022 1317   LABSPEC 1.015 10/06/2022 1317   PHURINE 8.5 (H) 10/06/2022 1317   GLUCOSEU NEGATIVE 10/06/2022 1317   HGBUR SMALL (A) 10/06/2022 1317   BILIRUBINUR NEGATIVE 10/06/2022 1317   KETONESUR NEGATIVE 10/06/2022 1317   PROTEINUR 100 (A) 10/06/2022 1317   UROBILINOGEN 0.2 02/13/2015 1823   NITRITE NEGATIVE 10/06/2022 1317   LEUKOCYTESUR SMALL (A) 10/06/2022 1317    Sepsis  Labs: Lactic Acid, Venous No results found for: "LATICACIDVEN"  MICROBIOLOGY: Recent Results (from the past 240 hour(s))  Resp panel by RT-PCR (RSV, Flu A&B, Covid) Nasopharyngeal Swab     Status: None   Collection Time: 10/03/22  3:38 PM   Specimen: Nasopharyngeal Swab; Nasal Swab  Result Value Ref Range Status   SARS Coronavirus 2 by RT PCR NEGATIVE NEGATIVE Final    Comment: (NOTE) SARS-CoV-2 target nucleic acids are NOT DETECTED.  The SARS-CoV-2 RNA is generally detectable in upper respiratory specimens during the acute phase of infection. The lowest concentration of SARS-CoV-2 viral copies this assay can detect is 138 copies/mL. A negative result does not preclude SARS-Cov-2 infection and should not be used as the sole basis for treatment or other patient management decisions. A negative result may occur with  improper specimen collection/handling, submission of specimen other than nasopharyngeal swab, presence of viral mutation(s) within the areas targeted by this assay, and inadequate number of viral copies(<138 copies/mL). A negative result must be combined with clinical observations, patient history, and epidemiological information. The expected result is Negative.  Fact Sheet for Patients:  EntrepreneurPulse.com.au  Fact Sheet for Healthcare Providers:  IncredibleEmployment.be  This test is no t yet approved or cleared by the [place] FDA and  has been authorized for detection and/or diagnosis of SARS-CoV-2 by FDA under an Emergency Use Authorization (EUA). This EUA will remain  in effect (meaning this test can be used) for the duration of the COVID-19 declaration under Section 564(b)(1) of the Act, 21 U.S.C.section 360bbb-3(b)(1), unless the authorization is terminated  or revoked sooner.       Influenza A by PCR NEGATIVE NEGATIVE Final   Influenza B by PCR NEGATIVE NEGATIVE Final    Comment: (NOTE) The Xpert Xpress  SARS-CoV-2/FLU/RSV plus assay is intended as an aid in the diagnosis of influenza from Nasopharyngeal swab specimens and should not be used as a sole basis for treatment. Nasal washings and aspirates are unacceptable for Xpert Xpress SARS-CoV-2/FLU/RSV testing.  Fact Sheet for Patients: EntrepreneurPulse.com.au  Fact Sheet for  Healthcare Providers: IncredibleEmployment.be  This test is not yet approved or cleared by the [place] and has been authorized for detection and/or diagnosis of SARS-CoV-2 by FDA under an Emergency Use Authorization (EUA). This EUA will remain in effect (meaning this test can be used) for the duration of the COVID-19 declaration under Section 564(b)(1) of the Act, 21 U.S.C. section 360bbb-3(b)(1), unless the authorization is terminated or revoked.     Resp Syncytial Virus by PCR NEGATIVE NEGATIVE Final    Comment: (NOTE) Fact Sheet for Patients: EntrepreneurPulse.com.au  Fact Sheet for Healthcare Providers: IncredibleEmployment.be  This test is not yet approved or cleared by the [place] FDA and has been authorized for detection and/or diagnosis of SARS-CoV-2 by FDA under an Emergency Use Authorization (EUA). This EUA will remain in effect (meaning this test can be used) for the duration of the COVID-19 declaration under Section 564(b)(1) of the Act, 21 U.S.C. section 360bbb-3(b)(1), unless the authorization is terminated or revoked.  Performed at [facility], [address] [place], [place] [address]   Urine Culture     Status: Abnormal   Collection Time: 10/03/22 10:08 PM   Specimen: Urine, Clean Catch  Result Value Ref Range Status   Specimen Description   Final    URINE, CLEAN CATCH Performed at [facility], [address] [place], [place] [address]    Special Requests   Final    NONE Performed at [facility], [address] [place], [place] [address]    Culture >=100,000 COLONIES/mL ESCHERICHIA COLI (A)  Final   Report Status 10/06/2022 FINAL  Final   Organism ID, Bacteria ESCHERICHIA COLI (A)  Final      Susceptibility   Escherichia coli - MIC*    AMPICILLIN <=2 SENSITIVE Sensitive     CEFAZOLIN <=4 SENSITIVE Sensitive     CEFEPIME <=0.12 SENSITIVE Sensitive     CEFTRIAXONE <=0.25 SENSITIVE Sensitive     CIPROFLOXACIN <=0.25 SENSITIVE Sensitive     GENTAMICIN <=1 SENSITIVE Sensitive     IMIPENEM <=0.25 SENSITIVE Sensitive     NITROFURANTOIN <=16 SENSITIVE Sensitive     TRIMETH/SULFA <=20 SENSITIVE Sensitive     AMPICILLIN/SULBACTAM <=2 SENSITIVE Sensitive     PIP/TAZO <=4 SENSITIVE Sensitive     * >=100,000 COLONIES/mL ESCHERICHIA COLI    RADIOLOGY STUDIES/RESULTS: DG Chest 2 View  Result Date: 10/06/2022 CLINICAL DATA:  Cough EXAM: CHEST - 2 VIEW COMPARISON:  10/05/2022 FINDINGS: Cardiac shadow is stable. Right jugular dialysis catheter is noted and stable. Lungs are well aerated bilaterally. Minimal basilar atelectasis is seen. No bony abnormality is noted. IMPRESSION: Mild bibasilar atelectatic changes. Electronically Signed   By: Inez Catalina M.D.   On: 10/06/2022 21:01   CT Angio Chest PE W/Cm &/Or Wo Cm  Result Date: 10/06/2022 CLINICAL DATA:  Shortness of breath for 3 days, history of sickle cell disease and end-stage renal failure EXAM: CT ANGIOGRAPHY CHEST WITH CONTRAST TECHNIQUE: Multidetector CT imaging of the chest was performed using the standard protocol during bolus administration of intravenous contrast. Multiplanar CT image reconstructions and MIPs were obtained to evaluate the vascular anatomy. RADIATION DOSE REDUCTION: This exam was performed according to the departmental dose-optimization program which includes automated exposure control, adjustment of the mA and/or kV according to patient size and/or use of iterative reconstruction technique. CONTRAST:  44m OMNIPAQUE  IOHEXOL 350 MG/ML SOLN COMPARISON:  Chest x-ray from 2 hours previous FINDINGS: Cardiovascular: Thoracic aorta  and its branches demonstrate atherosclerotic calcifications. No aneurysmal dilatation or dissection is noted. No cardiac enlargement is seen. Mild coronary calcifications are noted. The pulmonary artery shows a normal branching pattern bilaterally. No filling defect to suggest pulmonary embolism is noted. Right jugular dialysis catheter is noted in satisfactory position. Multiple left upper extremity venous stents are seen. Mediastinum/Nodes: Thoracic inlet is within normal limits. No hilar or mediastinal adenopathy is noted. The esophagus as visualized is within normal limits. Lungs/Pleura: Lungs are well aerated bilaterally. Mild basilar atelectatic changes are seen. These are stable from the prior plain film examination. No focal confluent infiltrate is noted. No effusion is seen. Upper Abdomen: Visualized upper abdomen shows no acute abnormality. Musculoskeletal: No chest wall abnormality. No acute or significant osseous findings. Review of the MIP images confirms the above findings. IMPRESSION: No evidence of pulmonary emboli. Bibasilar atelectatic changes. Aortic Atherosclerosis (ICD10-I70.0). Electronically Signed   By: Inez Catalina M.D.   On: 10/06/2022 02:14     LOS: 0 days   Oren Binet, MD  [facility]    To contact the attending provider between 7A-7P or the covering provider during after hours 7P-7A, please log into the web site [email or web address] and access using universal [place] password for that web site. If you do not have the password, please call the hospital operator.  10/07/2022, 9:11 AM

## 2022-10-07 NOTE — Consult Note (Signed)
Chief Complaint: Patient was seen in consultation today for malfunctioning dialysis catheter at the request of Nephrology  Referring Physician(s): Madelon Lips  Supervising Physician: Corrie Mckusick  Patient Status: [facility] - In-pt  History of Present Illness: Julie Wells is a 52 y.o. female with history of ESRD on HD MWF, HTN, DM2, Sickle cell disease, and HLD who is admitted with pyelonephritis.  Her left arm graft has failed and Vascular placed a tunneled catheter on Thursday last week. Per chart, the catheter would not run for dialysis last night.  IR asked to exchange.    Patient reports being unaware of catheter malfunction and believes she had a full dialysis session.  She says the catheter is new and believes it should work fine for her upcoming dialysis sessions.  She would like to hear from nephrology regarding this  Past Medical History:  Diagnosis Date   Chronic kidney disease    Frequent headaches    Hematuria 06/20/2016   High cholesterol    History of blood transfusion    multiple, no reactions   Hypertension    Migraine    " I constantly have my migraines" (06/23/2016)   Pneumonia    Short-term memory loss    /notes 06/23/2016   Sickle cell disease without crisis ([place]) 03/12/2020   Sickle cell trait ([place])    Stroke ([place]) 2010   short term memory loss/notes 06/23/2016   Type 2 diabetes, diet controlled ([place])    no meds, diet controlled    Past Surgical History:  Procedure Laterality Date   AV FISTULA PLACEMENT Left 12/04/2020   Procedure: LEFT BRACHOBASCILIC  ARTERIOVENOUS (AV) FISTULA CREATION;  Surgeon: [name], MD;  Location: [place];  Service: Vascular;  Laterality: Left;  PERIPHERAL NERVE BLOCK   AV FISTULA PLACEMENT Left 12/25/2020   Procedure: LEFT UPPER ARM ARTERIOVENOUS LOOP GRAFT PLACEMENT;  Surgeon: [name], MD;  Location: [facility] OR;  Service: Vascular;  Laterality: Left;   CESAREAN SECTION  X 2   COLONOSCOPY  2021   x 3   TUBAL  LIGATION      Allergies: Levofloxacin, Ciprofloxacin, Levaquin [levofloxacin in d5w], and Other  Medications: Prior to Admission medications   Medication Sig Start Date End Date Taking? Authorizing Provider  acetaminophen (TYLENOL) 500 MG tablet Take 500-1,000 mg by mouth every 6 (six) hours as needed for mild pain or headache.    Yes [provider]  albuterol (VENTOLIN HFA) 108 (90 Base) MCG/ACT inhaler Inhale 2 puffs into the lungs every 6 (six) hours as needed for wheezing or shortness of breath.   Yes [provider]  atorvastatin (LIPITOR) 40 MG tablet Take 40 mg by mouth daily. 07/03/20  Yes [provider]  B Complex-C-Folic Acid (DIALYVITE TABLET) TABS Take 1 tablet by mouth daily. 09/22/20  Yes [provider]  carvedilol (COREG) 25 MG tablet Take 25 mg by mouth 2 (two) times daily with a meal. 07/12/20  Yes [provider]  Cholecalciferol 50 MCG (2000 UT) CAPS Take 2,000 Units by mouth daily. 04/08/22 04/08/23 Yes [provider]  cinacalcet (SENSIPAR) 30 MG tablet Take 30 mg by mouth daily.   Yes [provider]  hydroxyurea (HYDREA) 500 MG capsule Take 500 mg by mouth daily. May take with food to minimize GI side effects.   Yes [provider]  lidocaine-prilocaine (EMLA) cream Apply 1 Application topically 2 (two) times a week. 01/21/21  Yes [provider]  NIFEdipine (ADALAT CC)  30 MG 24 hr tablet Take 30 mg by mouth daily. 05/09/20  Yes [provider]  Oxycodone HCl 10 MG TABS Take 10 mg by mouth every 6 (six) hours as needed (pain). 09/17/22  Yes [provider]  patiromer (VELTASSA) 8.4 g packet Take 8.4 g by mouth See admin instructions. Take on Sunday and Tuesday. 10/19/21  Yes [provider]  sevelamer carbonate (RENVELA) 800 MG tablet Take 1,600 mg by mouth 3 (three) times daily with meals. 08/27/20  Yes [provider]     History reviewed. No pertinent family  history.  Social History   Socioeconomic History   Marital status: Married    Spouse name: Not on file   Number of children: Not on file   Years of education: Not on file   Highest education level: Not on file  Occupational History   Not on file  Tobacco Use   Smoking status: Never   Smokeless tobacco: Never  Vaping Use   Vaping Use: Never used  Substance and Sexual Activity   Alcohol use: No   Drug use: No   Sexual activity: Not Currently    Birth control/protection: Surgical    Comment: Tubal Ligation  Other Topics Concern   Not on file  Social History Narrative   ** Merged History Encounter **       Social Determinants of Health   Financial Resource Strain: Not on file  Food Insecurity: No Food Insecurity (10/06/2022)   Hunger Vital Sign    Worried About Running Out of Food in the Last Year: Never true    Ran Out of Food in the Last Year: Never true  Transportation Needs: No Transportation Needs (10/06/2022)   PRAPARE - Hydrologist (Medical): No    Lack of Transportation (Non-Medical): No  Physical Activity: Not on file  Stress: Not on file  Social Connections: Not on file   Review of Systems: not obtained  Vital Signs: BP 108/85 (BP Location: Right Arm)   Pulse 81   Temp 97.7 F (36.5 C) (Oral)   Resp 11   Ht '5\' 2"'$  (1.575 m)   Wt 172 lb 9.9 oz (78.3 kg)   LMP 09/14/2022   SpO2 100%   BMI 31.57 kg/m   Physical Exam Constitutional:      General: She is not in acute distress.    Appearance: She is not ill-appearing.     Comments: Right chest dialysis catheter with clean, intact bandage  HENT:     Head: Normocephalic and atraumatic.  Cardiovascular:     Rate and Rhythm: Normal rate.  Pulmonary:     Effort: Pulmonary effort is normal. No respiratory distress.  Skin:    General: Skin is dry.  Neurological:     General: No focal deficit present.     Mental Status: She is alert and oriented to person, place, and time.   Psychiatric:        Mood and Affect: Mood normal.        Behavior: Behavior normal.     Imaging: DG Chest 2 View  Result Date: 10/06/2022 CLINICAL DATA:  Cough EXAM: CHEST - 2 VIEW COMPARISON:  10/05/2022 FINDINGS: Cardiac shadow is stable. Right jugular dialysis catheter is noted and stable. Lungs are well aerated bilaterally. Minimal basilar atelectasis is seen. No bony abnormality is noted. IMPRESSION: Mild bibasilar atelectatic changes. Electronically Signed   By: Inez Catalina M.D.   On: 10/06/2022 21:01  CT Angio Chest PE W/Cm &/Or Wo Cm  Result Date: 10/06/2022 CLINICAL DATA:  Shortness of breath for 3 days, history of sickle cell disease and end-stage renal failure EXAM: CT ANGIOGRAPHY CHEST WITH CONTRAST TECHNIQUE: Multidetector CT imaging of the chest was performed using the standard protocol during bolus administration of intravenous contrast. Multiplanar CT image reconstructions and MIPs were obtained to evaluate the vascular anatomy. RADIATION DOSE REDUCTION: This exam was performed according to the departmental dose-optimization program which includes automated exposure control, adjustment of the mA and/or kV according to patient size and/or use of iterative reconstruction technique. CONTRAST:  58m OMNIPAQUE IOHEXOL 350 MG/ML SOLN COMPARISON:  Chest x-ray from 2 hours previous FINDINGS: Cardiovascular: Thoracic aorta and its branches demonstrate atherosclerotic calcifications. No aneurysmal dilatation or dissection is noted. No cardiac enlargement is seen. Mild coronary calcifications are noted. The pulmonary artery shows a normal branching pattern bilaterally. No filling defect to suggest pulmonary embolism is noted. Right jugular dialysis catheter is noted in satisfactory position. Multiple left upper extremity venous stents are seen. Mediastinum/Nodes: Thoracic inlet is within normal limits. No hilar or mediastinal adenopathy is noted. The esophagus as visualized is within normal  limits. Lungs/Pleura: Lungs are well aerated bilaterally. Mild basilar atelectatic changes are seen. These are stable from the prior plain film examination. No focal confluent infiltrate is noted. No effusion is seen. Upper Abdomen: Visualized upper abdomen shows no acute abnormality. Musculoskeletal: No chest wall abnormality. No acute or significant osseous findings. Review of the MIP images confirms the above findings. IMPRESSION: No evidence of pulmonary emboli. Bibasilar atelectatic changes. Aortic Atherosclerosis (ICD10-I70.0). Electronically Signed   By: MInez CatalinaM.D.   On: 10/06/2022 02:14   CT Abdomen Pelvis W Contrast  Result Date: 10/03/2022 CLINICAL DATA:  Abdominal/flank pain, stone suspected EXAM: CT ABDOMEN AND PELVIS WITH CONTRAST TECHNIQUE: Multidetector CT imaging of the abdomen and pelvis was performed using the standard protocol following bolus administration of intravenous contrast. RADIATION DOSE REDUCTION: This exam was performed according to the departmental dose-optimization program which includes automated exposure control, adjustment of the mA and/or kV according to patient size and/or use of iterative reconstruction technique. CONTRAST:  1017mOMNIPAQUE IOHEXOL 300 MG/ML  SOLN COMPARISON:  MRI abdomen 08/06/2016, CT abdomen pelvis 04/20/2009 report without imaging FINDINGS: Lower chest: No acute abnormality. Hepatobiliary: No focal liver abnormality. No gallstones, gallbladder wall thickening, or pericholecystic fluid. No biliary dilatation. Pancreas: No focal lesion. Normal pancreatic contour. No surrounding inflammatory changes. No main pancreatic ductal dilatation. Spleen: Normal in size without focal abnormality. Adrenals/Urinary Tract: No adrenal nodule bilaterally. Bilateral atrophic kidneys. Bilateral kidneys enhance symmetrically. Multiple fluid density lesions within the kidneys likely represent simple renal cysts. Simple renal cysts, in the absence of clinically  indicated signs/symptoms, require no independent follow-up. Subcentimeter hypodensities are too small to characterize-no further follow-up indicated. No hydronephrosis. No hydroureter. The urinary bladder is unremarkable. No excretion of intravenous contrast on delayed view. Stomach/Bowel: Stomach is within normal limits. No evidence of bowel wall thickening or dilatation. Appendix appears normal. Vascular/Lymphatic: No abdominal aorta or iliac aneurysm. Mild to moderate atherosclerotic plaque of the aorta and its branches. No abdominal, pelvic, or inguinal lymphadenopathy. Reproductive: Multiseptated 3.8 x 3.2 cm left ovarian cystic lesion. Multi septated 3.7 x 3 cm right ovarian cystic lesion that is better evaluated on coronal images. Uterus and bilateral adnexa are unremarkable. Other: No intraperitoneal free fluid. No intraperitoneal free gas. No organized fluid collection. Musculoskeletal: No abdominal wall hernia or abnormality. No suspicious lytic or  blastic osseous lesions. No acute displaced fracture. Multilevel degenerative changes of the spine. IMPRESSION: 1. Multiseptated 3.8 cm left and 3.7 cm right ovarian cystic lesions. Recommend pelvic ultrasound for further evaluation. 2. Bilateral atrophic kidneys with no excretion of intravenous contrast on delayed view. Correlate with renal function. 3.  Aortic Atherosclerosis (ICD10-I70.0). Electronically Signed   By: Iven Finn M.D.   On: 10/03/2022 20:19   DG Chest 2 View  Result Date: 10/03/2022 CLINICAL DATA:  Chest pain and shortness of breath.  Dialysis. EXAM: CHEST - 2 VIEW COMPARISON:  06/20/2016 FINDINGS: The heart size and mediastinal contours are within normal limits. Right jugular dual-lumen central venous dialysis catheter is seen in appropriate position. Evidence of pneumothorax. Low lung volumes are seen with mild bibasilar atelectasis. No evidence of pulmonary infiltrate or pleural effusion. IMPRESSION: Low lung volumes with mild  bibasilar atelectasis. Electronically Signed   By: Marlaine Hind M.D.   On: 10/03/2022 17:57    Labs:  CBC: Recent Labs    10/03/22 1805 10/06/22 0131 10/06/22 1856 10/07/22 0755  WBC 18.0* 20.4* 16.0* 16.7*  HGB 8.8* 8.3* 8.2* 7.8*  HCT 25.2* 23.9* 23.0* 21.3*  PLT 388 375 344 335    COAGS: No results for input(s): "INR", "APTT" in the last 8760 hours.  BMP: Recent Labs    10/03/22 1805 10/06/22 0131 10/06/22 1856 10/07/22 0755  NA 136 136  --  136  K 4.7 4.8  --  5.0  CL 95* 95*  --  93*  CO2 27 27  --  25  GLUCOSE 90 108*  --  103*  BUN 38* 40*  --  48*  CALCIUM 7.7* 7.7*  --  7.8*  CREATININE 10.66* 9.76* 11.04* 11.94*  GFRNONAA 4* 4* 4* 3*    LIVER FUNCTION TESTS: Recent Labs    10/03/22 1805 10/06/22 0131 10/07/22 0755  BILITOT 0.5 0.5  --   AST 15 26  --   ALT 12 18  --   ALKPHOS 95 100  --   PROT 7.7 8.1  --   ALBUMIN 3.6 3.5 3.0*    Assessment and Plan:  Malfunctioning dialysis catheter --clarification needed regarding functioning of the catheter.  Discussion with nephrology confirms the catheter is occluded and nephrology has explained this to her and the need for exchange.   --no procedure scheduled for today, will attempt for tomorrow. --Patient is A/Ox4 and can consent for self in IR for tunneled catheter exchange.  Thank you for this interesting consult.  I greatly enjoyed meeting NEAL OSHEA and look forward to participating in their care.  A copy of this report was sent to the requesting provider on this date.  Electronically Signed: Pasty Spillers, PA 10/07/2022, 2:20 PM   I spent a total of 20 Minutes in face to face in clinical consultation, greater than 50% of which was counseling/coordinating care for tunneled dialysis catheter malfunction.

## 2022-10-07 NOTE — Plan of Care (Signed)
Patient is alert, oriented, and able to voice needs. Assistance with ADLs provided by spouse. PRN Oxy x1 given for pain management. Patient and spouse were educated about changes made to blood pressure medications by hospitalist. Plans are made for patient to have exchange of HD cath on 10/08/2022. Call bell within reach. Will continue to monitor.   Problem: Health Behavior/Discharge Planning: Goal: Ability to manage health-related needs will improve Outcome: Progressing   Problem: Clinical Measurements: Goal: Ability to maintain clinical measurements within normal limits will improve Outcome: Progressing Goal: Will remain free from infection Outcome: Progressing Goal: Diagnostic test results will improve Outcome: Progressing Goal: Respiratory complications will improve Outcome: Progressing Goal: Cardiovascular complication will be avoided Outcome: Progressing   Problem: Activity: Goal: Risk for activity intolerance will decrease Outcome: Progressing   Problem: Nutrition: Goal: Adequate nutrition will be maintained Outcome: Progressing   Problem: Coping: Goal: Level of anxiety will decrease Outcome: Progressing   Problem: Elimination: Goal: Will not experience complications related to bowel motility Outcome: Progressing Goal: Will not experience complications related to urinary retention Outcome: Progressing   Problem: Pain Managment: Goal: General experience of comfort will improve Outcome: Progressing   Problem: Safety: Goal: Ability to remain free from injury will improve Outcome: Progressing   Problem: Skin Integrity: Goal: Risk for impaired skin integrity will decrease Outcome: Progressing

## 2022-10-07 NOTE — Progress Notes (Signed)
PT received cathflow in both port due to very stiff pull in both, will sit overnight and be ran on [place] first thing tomorrow, per Dr. Augustin Coupe on call.

## 2022-10-07 NOTE — Progress Notes (Signed)
Pt receives out-pt HD at [facility] on MWF. Pt arrives at 9:15 for 9:30 chair time. Will assist as needed.   Melven Sartorius Renal Navigator [phone]

## 2022-10-07 NOTE — Progress Notes (Signed)
[facility] Progress Note   Assessment/ Plan:   Dialysis orders [facility] MWF 3hr 3mn RIJ TC, failed LUA loop 180NRe 400/500 EDW 74kg (left at 74.1 on 1/15) 1/2/2.5 UFR profile 2 Mircera 150 q2weeks (last given 1/10) Hectorol 235m   Assessment/Plan: Left sided pyelonephritis treated with Rocephin; positive for E.Coli on urine culture. Cough with rales on the left side- CT angio noted no PE or consolidation ESRD on MWF regimen, missed treatment Wednesday- had cathflo overnight, will see how that works today.   Renal osteodystrophy - last P4.1 on 1/3 on Renvela 1-2 TIDM Anemia - has chronic anemia from ESRD being treated with Mircera; does not appear to be in a sickle crisis at this time although she does sometimes have crisis with severe cold weather. Sickle Cell Disease - again, does not appear to be in crisis.  Subjective:    Seen in HD- about to start rx.  Had catheter issues last night, couldn't run.  Cathflo instilled.     Objective:   BP 96/61 (BP Location: Right Arm)   Pulse 81   Temp 97.7 F (36.5 C) (Oral)   Resp 11   Ht '5\' 2"'$  (1.575 m)   Wt 78.3 kg   LMP 09/14/2022   SpO2 100%   BMI 31.57 kg/m   Physical Exam: Gen: appears uncomfortable CVS: RRR Resp: clear Abd: soft Ext: no LE edema GU: L sided flank tenderness ACCESS: L AVG clotted, R IJ TDC  Labs: BMET Recent Labs  Lab 10/03/22 1805 10/06/22 0131 10/06/22 1856 10/07/22 0755  NA 136 136  --  136  K 4.7 4.8  --  5.0  CL 95* 95*  --  93*  CO2 27 27  --  25  GLUCOSE 90 108*  --  103*  BUN 38* 40*  --  48*  CREATININE 10.66* 9.76* 11.04* 11.94*  CALCIUM 7.7* 7.7*  --  7.8*  PHOS  --   --   --  6.7*   CBC Recent Labs  Lab 10/03/22 1805 10/06/22 0131 10/06/22 1856 10/07/22 0755  WBC 18.0* 20.4* 16.0* PENDING  NEUTROABS 12.0* 14.8*  --   --   HGB 8.8* 8.3* 8.2* 7.8*  HCT 25.2* 23.9* 23.0* 21.3*  MCV 105.9* 106.2* 105.0* 102.9*  PLT 388 375 344 335      Medications:      atorvastatin  40 mg Oral Daily   carvedilol  25 mg Oral BID WC   Chlorhexidine Gluconate Cloth  6 each Topical Q0600   cholecalciferol  2,000 Units Oral Daily   cinacalcet  30 mg Oral Daily   heparin  5,000 Units Subcutaneous Q8H   hydroxyurea  500 mg Oral Daily   insulin aspart  0-9 Units Subcutaneous TID WC   multivitamin  1 tablet Oral Daily   NIFEdipine  30 mg Oral Daily   [START ON 10/10/2022] patiromer  8.4 g Oral Once per day on Sun Tue   sevelamer carbonate  1,600 mg Oral TID WC   sodium chloride flush  3 mL Intravenous Q12H     [place] LipsMD 10/07/2022, 9:35 AM

## 2022-10-07 NOTE — Progress Notes (Signed)
Cathflo aspirated from both lumens of hd line. Arterial lumen continues to be sluggish. Lines reversed and tx was abl e to be initiated.

## 2022-10-07 NOTE — Progress Notes (Signed)
Catheter is performing poorly. Occlusion in both lumens requiring frequent flushes and reversing lines. Informed provider for possible intervention.

## 2022-10-08 ENCOUNTER — Inpatient Hospital Stay (HOSPITAL_COMMUNITY): Payer: Medicare PPO

## 2022-10-08 DIAGNOSIS — D631 Anemia in chronic kidney disease: Secondary | ICD-10-CM

## 2022-10-08 DIAGNOSIS — N186 End stage renal disease: Secondary | ICD-10-CM | POA: Diagnosis not present

## 2022-10-08 DIAGNOSIS — D571 Sickle-cell disease without crisis: Secondary | ICD-10-CM

## 2022-10-08 DIAGNOSIS — R52 Pain, unspecified: Secondary | ICD-10-CM | POA: Diagnosis not present

## 2022-10-08 DIAGNOSIS — Z992 Dependence on renal dialysis: Secondary | ICD-10-CM

## 2022-10-08 HISTORY — PX: IR FLUORO GUIDE CV LINE RIGHT: IMG2283

## 2022-10-08 LAB — RENAL FUNCTION PANEL
Albumin: 2.8 g/dL — ABNORMAL LOW (ref 3.5–5.0)
Anion gap: 16 — ABNORMAL HIGH (ref 5–15)
BUN: 45 mg/dL — ABNORMAL HIGH (ref 6–20)
CO2: 23 mmol/L (ref 22–32)
Calcium: 7.3 mg/dL — ABNORMAL LOW (ref 8.9–10.3)
Chloride: 95 mmol/L — ABNORMAL LOW (ref 98–111)
Creatinine, Ser: 10.84 mg/dL — ABNORMAL HIGH (ref 0.44–1.00)
GFR, Estimated: 4 mL/min — ABNORMAL LOW (ref >60)
Glucose, Bld: 84 mg/dL (ref 70–99)
Phosphorus: 5.5 mg/dL — ABNORMAL HIGH (ref 2.5–4.6)
Potassium: 5.3 mmol/L — ABNORMAL HIGH (ref 3.5–5.1)
Sodium: 134 mmol/L — ABNORMAL LOW (ref 135–145)

## 2022-10-08 LAB — CBC
HCT: 19.7 % — ABNORMAL LOW (ref 36.0–46.0)
Hemoglobin: 7 g/dL — ABNORMAL LOW (ref 12.0–15.0)
MCH: 37.2 pg — ABNORMAL HIGH (ref 26.0–34.0)
MCHC: 35.5 g/dL (ref 30.0–36.0)
MCV: 104.8 fL — ABNORMAL HIGH (ref 80.0–100.0)
Platelets: 284 10*3/uL (ref 150–400)
RBC: 1.88 MIL/uL — ABNORMAL LOW (ref 3.87–5.11)
RDW: 19.3 % — ABNORMAL HIGH (ref 11.5–15.5)
WBC: 13 10*3/uL — ABNORMAL HIGH (ref 4.0–10.5)
nRBC: 8.3 % — ABNORMAL HIGH (ref 0.0–0.2)

## 2022-10-08 LAB — GLUCOSE, CAPILLARY
Glucose-Capillary: 76 mg/dL (ref 70–99)
Glucose-Capillary: 114 mg/dL — ABNORMAL HIGH (ref 70–99)
Glucose-Capillary: 98 mg/dL (ref 70–99)

## 2022-10-08 LAB — HEPATITIS B SURFACE ANTIBODY, QUANTITATIVE: Hep B S AB Quant (Post): >1000 m[IU]/mL (ref >9.9)

## 2022-10-08 MED ORDER — CEFAZOLIN SODIUM-DEXTROSE 2-4 GM/100ML-% IV SOLN
2.0000 g | Freq: Once | INTRAVENOUS | Status: AC
  Administered 2022-10-08: 2 g via INTRAVENOUS

## 2022-10-08 MED ORDER — HEPARIN SODIUM (PORCINE) 1000 UNIT/ML IJ SOLN
INTRAMUSCULAR | Status: AC
  Filled 2022-10-08: qty 10

## 2022-10-08 MED ORDER — CEFAZOLIN SODIUM-DEXTROSE 2-4 GM/100ML-% IV SOLN
INTRAVENOUS | Status: AC
  Filled 2022-10-08: qty 100

## 2022-10-08 MED ORDER — SODIUM ZIRCONIUM CYCLOSILICATE 10 G PO PACK
10.0000 g | PACK | Freq: Every day | ORAL | Status: AC
  Administered 2022-10-08: 10 g via ORAL
  Filled 2022-10-08: qty 1

## 2022-10-08 MED ORDER — LIDOCAINE HCL 1 % IJ SOLN
INTRAMUSCULAR | Status: AC
  Administered 2022-10-08: 10 mL
  Filled 2022-10-08: qty 20

## 2022-10-08 MED ORDER — CARVEDILOL 6.25 MG PO TABS
6.2500 mg | ORAL_TABLET | Freq: Two times a day (BID) | ORAL | Status: DC
  Administered 2022-10-09 – 2022-10-10 (×3): 6.25 mg via ORAL
  Filled 2022-10-08 (×3): qty 1

## 2022-10-08 NOTE — Progress Notes (Signed)
PROGRESS NOTE        PATIENT DETAILS Name: Julie Wells Age: 52 y.o. Sex: female Date of Birth: 1971/08/15 Admit Date: 10/06/2022 Admitting Physician Vianne Bulls, MD TGG:YIRSW, Merlyn Albert, MD  Brief Summary: Patient is a 52 y.o.  female with history of ESRD on HD MWF, HTN, DM-2, sickle cell disease, HLD-presented with left flank pain (second ED visit)-thought to have pyelonephritis and subsequently admitted to the hospitalist service.  Significant events: 1/14>> presented to [facility] with left flank pain-manage medically-discharge home. 1/17>> presented to [facility]-worsening left flank pain-urine culture on 1/14 positive for E. coli-admit to [facility].    Significant studies: 1/14>> CT abdomen/pelvis: 3.8 cm left and 3.7 cm right ovarian cyst.  Bilateral atrophic kidneys. 1/17>> CT angio chest: No PE, bibasilar atelectatic changes.  Significant microbiology data: 1/14>> urine culture: E. coli  Procedures: None  Consults: Nephrology  Subjective: Pain in the left flank area is much improved.  She appears much more comfortable-specially when moving around in bed (was not able to move without wincing in pain for the past few days)  Objective: Vitals: Blood pressure 105/60, pulse 87, temperature 98.8 F (37.1 C), temperature source Oral, resp. rate 18, height '5\' 2"'$  (1.575 m), weight 80.8 kg, last menstrual period 09/14/2022, SpO2 95 %.   Exam: Gen Exam:Alert awake-not in any distress HEENT:atraumatic, normocephalic Chest: B/L clear to auscultation anteriorly CVS:S1S2 regular Abdomen:soft non tender, non distended.  Significantly less CVA angle tenderness today. Extremities:no edema Neurology: Non focal Skin: no rash  Pertinent Labs/Radiology:    Latest Ref Rng & Units 10/08/2022    5:16 AM 10/07/2022    7:55 AM 10/06/2022    6:56 PM  CBC  WBC 4.0 - 10.5 K/uL 13.0  16.7  16.0   Hemoglobin 12.0 - 15.0 g/dL 7.0  7.8  8.2   Hematocrit 36.0 - 46.0 % 19.7   21.3  23.0   Platelets 150 - 400 K/uL 284  335  344     Lab Results  Component Value Date   NA 134 (L) 10/08/2022   K 5.3 (H) 10/08/2022   CL 95 (L) 10/08/2022   CO2 23 10/08/2022      Assessment/Plan: Severe left flank pain-likely due to pyelonephritis involving left kidney-recent urine culture on 1/14 positive for E. coli Overall much improved-significant less pain Leukocytosis downtrending Continue IV Rocephin Continue supportive care/as needed narcotics/muscle relaxants    ESRD on HD MWF Nephrology following for HD needs Due to malfunctioning HD catheter-unable to dialyze for the past several days-IR to replace catheter today.  Malfunctioning tunneled HD catheter Appears to be occluded-IR planning catheter exchange today  Hyperkalemia Mild Due to missed HD 1 dose of Lokelma today-hopefully following catheter exchange will get HD later today.   Normocytic anemia Due to combination of ESRD and sickle cell disease Worsening Hb likely due to acute illness Follow CV before meals and transfuse if significant drop.   HTN BP still soft Nifedipine on hold Cut Coreg down to 6.25 mg twice daily today Reassess tomorrow.   HLD Continue statin   Sickle cell disease Does not appear to be in crisis-as this current clinical presentation is not very typical of her usual sickle cell crisis pain Will monitor for now   Bilateral ovarian cyst Seen incidentally on CT abdomen on 1/14 Likely not the cause for her left  flank pain Outpatient follow-up with PCP/GYN  Obesity: Estimated body mass index is 32.58 kg/m as calculated from the following:   Height as of this encounter: '5\' 2"'$  (1.575 m).   Weight as of this encounter: 80.8 kg.   Code status:   Code Status: Full Code   DVT Prophylaxis: heparin injection 5,000 Units Start: 10/06/22 2200   Family Communication: Spouse at bedside   Disposition Plan: Status is: Observation The patient will require care spanning > 2  midnights and should be moved to inpatient because: Severity of illness   Planned Discharge Destination:Home hopefully over the weekend.   Diet: Diet Order             Diet renal/carb modified with fluid restriction Diet-HS Snack? Nothing; Fluid restriction: 1200 mL Fluid; Room service appropriate? Yes; Fluid consistency: Thin  Diet effective now                     Antimicrobial agents: Anti-infectives (From admission, onward)    Start     Dose/Rate Route Frequency Ordered Stop   10/06/22 2200  cefTRIAXone (ROCEPHIN) 2 g in sodium chloride 0.9 % 100 mL IVPB        2 g 200 mL/hr over 30 Minutes Intravenous Every 24 hours 10/06/22 1732     10/06/22 0230  cefTRIAXone (ROCEPHIN) 1 g in sodium chloride 0.9 % 100 mL IVPB        1 g 200 mL/hr over 30 Minutes Intravenous  Once 10/06/22 0220 10/06/22 0326        MEDICATIONS: Scheduled Meds:  atorvastatin  40 mg Oral Daily   carvedilol  6.25 mg Oral BID WC   Chlorhexidine Gluconate Cloth  6 each Topical Q0600   cholecalciferol  2,000 Units Oral Daily   cinacalcet  30 mg Oral Daily   heparin  5,000 Units Subcutaneous Q8H   hydroxyurea  500 mg Oral Daily   insulin aspart  0-9 Units Subcutaneous TID WC   multivitamin  1 tablet Oral Daily   [START ON 10/10/2022] patiromer  8.4 g Oral Once per day on Sun Tue   sevelamer carbonate  1,600 mg Oral TID WC   sodium chloride flush  3 mL Intravenous Q12H   Continuous Infusions:  sodium chloride     cefTRIAXone (ROCEPHIN)  IV 2 g (10/07/22 2156)   PRN Meds:.sodium chloride, acetaminophen **OR** acetaminophen, cyclobenzaprine, hydrALAZINE, HYDROmorphone (DILAUDID) injection, ondansetron **OR** ondansetron (ZOFRAN) IV, oxyCODONE, polyethylene glycol, sodium chloride flush   I have personally reviewed following labs and imaging studies  LABORATORY DATA: CBC: Recent Labs  Lab 10/03/22 1805 10/06/22 0131 10/06/22 1856 10/07/22 0755 10/08/22 0516  WBC 18.0* 20.4* 16.0* 16.7*  13.0*  NEUTROABS 12.0* 14.8*  --   --   --   HGB 8.8* 8.3* 8.2* 7.8* 7.0*  HCT 25.2* 23.9* 23.0* 21.3* 19.7*  MCV 105.9* 106.2* 105.0* 102.9* 104.8*  PLT 388 375 344 335 284     Basic Metabolic Panel: Recent Labs  Lab 10/03/22 1805 10/06/22 0131 10/06/22 1856 10/07/22 0755 10/08/22 0516  NA 136 136  --  136 134*  K 4.7 4.8  --  5.0 5.3*  CL 95* 95*  --  93* 95*  CO2 27 27  --  25 23  GLUCOSE 90 108*  --  103* 84  BUN 38* 40*  --  48* 45*  CREATININE 10.66* 9.76* 11.04* 11.94* 10.84*  CALCIUM 7.7* 7.7*  --  7.8* 7.3*  PHOS  --   --   --  6.7* 5.5*     GFR: Estimated Creatinine Clearance: 5.9 mL/min (A) (by C-G formula based on SCr of 10.84 mg/dL (H)).  Liver Function Tests: Recent Labs  Lab 10/03/22 1805 10/06/22 0131 10/07/22 0755 10/08/22 0516  AST 15 26  --   --   ALT 12 18  --   --   ALKPHOS 95 100  --   --   BILITOT 0.5 0.5  --   --   PROT 7.7 8.1  --   --   ALBUMIN 3.6 3.5 3.0* 2.8*    No results for input(s): "LIPASE", "AMYLASE" in the last 168 hours. No results for input(s): "AMMONIA" in the last 168 hours.  Coagulation Profile: No results for input(s): "INR", "PROTIME" in the last 168 hours.  Cardiac Enzymes: No results for input(s): "CKTOTAL", "CKMB", "CKMBINDEX", "TROPONINI" in the last 168 hours.  BNP (last 3 results) No results for input(s): "PROBNP" in the last 8760 hours.  Lipid Profile: No results for input(s): "CHOL", "HDL", "LDLCALC", "TRIG", "CHOLHDL", "LDLDIRECT" in the last 72 hours.  Thyroid Function Tests: No results for input(s): "TSH", "T4TOTAL", "FREET4", "T3FREE", "THYROIDAB" in the last 72 hours.  Anemia Panel: No results for input(s): "VITAMINB12", "FOLATE", "FERRITIN", "TIBC", "IRON", "RETICCTPCT" in the last 72 hours.  Urine analysis:    Component Value Date/Time   COLORURINE YELLOW 10/06/2022 1317   APPEARANCEUR HAZY (A) 10/06/2022 1317   LABSPEC 1.015 10/06/2022 1317   PHURINE 8.5 (H) 10/06/2022 1317   GLUCOSEU  NEGATIVE 10/06/2022 1317   HGBUR SMALL (A) 10/06/2022 1317   BILIRUBINUR NEGATIVE 10/06/2022 1317   KETONESUR NEGATIVE 10/06/2022 1317   PROTEINUR 100 (A) 10/06/2022 1317   UROBILINOGEN 0.2 02/13/2015 1823   NITRITE NEGATIVE 10/06/2022 1317   LEUKOCYTESUR SMALL (A) 10/06/2022 1317    Sepsis Labs: Lactic Acid, Venous No results found for: "LATICACIDVEN"  MICROBIOLOGY: Recent Results (from the past 240 hour(s))  Resp panel by RT-PCR (RSV, Flu A&B, Covid) Nasopharyngeal Swab     Status: None   Collection Time: 10/03/22  3:38 PM   Specimen: Nasopharyngeal Swab; Nasal Swab  Result Value Ref Range Status   SARS Coronavirus 2 by RT PCR NEGATIVE NEGATIVE Final    Comment: (NOTE) SARS-CoV-2 target nucleic acids are NOT DETECTED.  The SARS-CoV-2 RNA is generally detectable in upper respiratory specimens during the acute phase of infection. The lowest concentration of SARS-CoV-2 viral copies this assay can detect is 138 copies/mL. A negative result does not preclude SARS-Cov-2 infection and should not be used as the sole basis for treatment or other patient management decisions. A negative result may occur with  improper specimen collection/handling, submission of specimen other than nasopharyngeal swab, presence of viral mutation(s) within the areas targeted by this assay, and inadequate number of viral copies(<138 copies/mL). A negative result must be combined with clinical observations, patient history, and epidemiological information. The expected result is Negative.  Fact Sheet for Patients:  EntrepreneurPulse.com.au  Fact Sheet for Healthcare Providers:  IncredibleEmployment.be  This test is no t yet approved or cleared by the [place] FDA and  has been authorized for detection and/or diagnosis of SARS-CoV-2 by FDA under an Emergency Use Authorization (EUA). This EUA will remain  in effect (meaning this test can be used) for the  duration of the COVID-19 declaration under Section 564(b)(1) of the Act, 21 U.S.C.section 360bbb-3(b)(1), unless the authorization is terminated  or revoked sooner.       Influenza A by PCR NEGATIVE NEGATIVE Final  Influenza B by PCR NEGATIVE NEGATIVE Final    Comment: (NOTE) The Xpert Xpress SARS-CoV-2/FLU/RSV plus assay is intended as an aid in the diagnosis of influenza from Nasopharyngeal swab specimens and should not be used as a sole basis for treatment. Nasal washings and aspirates are unacceptable for Xpert Xpress SARS-CoV-2/FLU/RSV testing.  Fact Sheet for Patients: EntrepreneurPulse.com.au  Fact Sheet for Healthcare Providers: IncredibleEmployment.be  This test is not yet approved or cleared by the [place] FDA and has been authorized for detection and/or diagnosis of SARS-CoV-2 by FDA under an Emergency Use Authorization (EUA). This EUA will remain in effect (meaning this test can be used) for the duration of the COVID-19 declaration under Section 564(b)(1) of the Act, 21 U.S.C. section 360bbb-3(b)(1), unless the authorization is terminated or revoked.     Resp Syncytial Virus by PCR NEGATIVE NEGATIVE Final    Comment: (NOTE) Fact Sheet for Patients: EntrepreneurPulse.com.au  Fact Sheet for Healthcare Providers: IncredibleEmployment.be  This test is not yet approved or cleared by the [place] FDA and has been authorized for detection and/or diagnosis of SARS-CoV-2 by FDA under an Emergency Use Authorization (EUA). This EUA will remain in effect (meaning this test can be used) for the duration of the COVID-19 declaration under Section 564(b)(1) of the Act, 21 U.S.C. section 360bbb-3(b)(1), unless the authorization is terminated or revoked.  Performed at [facility], [address] [place], [place] [address]   Urine Culture     Status: Abnormal   Collection Time:  10/03/22 10:08 PM   Specimen: Urine, Clean Catch  Result Value Ref Range Status   Specimen Description   Final    URINE, CLEAN CATCH Performed at [facility], [address] [place], [place] [address]    Special Requests   Final    NONE Performed at [facility], [address] [place], [place] [address]    Culture >=100,000 COLONIES/mL ESCHERICHIA COLI (A)  Final   Report Status 10/06/2022 FINAL  Final   Organism ID, Bacteria ESCHERICHIA COLI (A)  Final      Susceptibility   Escherichia coli - MIC*    AMPICILLIN <=2 SENSITIVE Sensitive     CEFAZOLIN <=4 SENSITIVE Sensitive     CEFEPIME <=0.12 SENSITIVE Sensitive     CEFTRIAXONE <=0.25 SENSITIVE Sensitive     CIPROFLOXACIN <=0.25 SENSITIVE Sensitive     GENTAMICIN <=1 SENSITIVE Sensitive     IMIPENEM <=0.25 SENSITIVE Sensitive     NITROFURANTOIN <=16 SENSITIVE Sensitive     TRIMETH/SULFA <=20 SENSITIVE Sensitive     AMPICILLIN/SULBACTAM <=2 SENSITIVE Sensitive     PIP/TAZO <=4 SENSITIVE Sensitive     * >=100,000 COLONIES/mL ESCHERICHIA COLI    RADIOLOGY STUDIES/RESULTS: DG Chest 2 View  Result Date: 10/06/2022 CLINICAL DATA:  Cough EXAM: CHEST - 2 VIEW COMPARISON:  10/05/2022 FINDINGS: Cardiac shadow is stable. Right jugular dialysis catheter is noted and stable. Lungs are well aerated bilaterally. Minimal basilar atelectasis is seen. No bony abnormality is noted. IMPRESSION: Mild bibasilar atelectatic changes. Electronically Signed   By: Inez Catalina M.D.   On: 10/06/2022 21:01     LOS: 1 day   Oren Binet, MD  [facility]    To contact the attending provider between 7A-7P or the covering provider during after hours 7P-7A, please log into the web site [email or web address] and access using universal [place] password for that web site. If you do not have the password, please call the  hospital operator.  10/08/2022, 9:19 AM

## 2022-10-08 NOTE — Progress Notes (Signed)
Patient scheduled today for tunneled dialysis catheter exchange. Per patient the line was [facility] end [facility]. The catheter is now occluded and has been unresponsive to cathflo.   Patient agreeable to procedure today and the consent is on the chart.   Risks and benefits discussed with the patient including, but not limited to bleeding, infection, vascular injury, pneumothorax which may require chest tube placement, air embolism or even death  All of the patient's questions were answered, patient is agreeable to proceed. Consent signed and in chart.  Please see full consult note dated 10/07/22.  Soyla Dryer, [place] [phone] 10/08/2022, 9:12 AM

## 2022-10-08 NOTE — Progress Notes (Signed)
  Transition of Care [facility]) Screening Note   Patient Details  Name: Julie Wells Date of Birth: 1971-02-16   Transition of Care [facility]) CM/SW Contact:    Tom-Johnson, Renea Ee, RN Phone Number: 10/08/2022, 3:46 PM  Transition of Care Department [facility]) has reviewed patient and no TOC needs or recommendations have been identified at this time. TOC will continue to monitor patient advancement through interdisciplinary progression rounds. If new patient transition needs arise, please place a TOC consult.

## 2022-10-08 NOTE — Plan of Care (Signed)

## 2022-10-08 NOTE — Procedures (Signed)
Interventional Radiology Procedure:   Indications: ESRD and poorly functioning dialysis catheter  Procedure: Exchange of tunneled dialysis catheter  Findings: New 19 cm Palindrome placed over wire.  Tip at SVC/RA junction.   Complications: No immediate complications noted.     EBL: Minimal  Plan: Dialysis catheter is ready to use.    Younes Degeorge R. Anselm Pancoast, MD  Pager: [phone]

## 2022-10-08 NOTE — Progress Notes (Signed)
Julie Wells Progress Note   Subjective:   Patient seen and examined at bedside.  Reports ongoing pain in L flank.  Denies CP, SOB, abdominal pain and n/v/d.  Had [facility] exchange this AM. Waiting to go to dialysis.   Objective Vitals:   10/07/22 2026 10/08/22 0531 10/08/22 0700 10/08/22 0943  BP: (!) 89/55 105/60  106/64  Pulse: 83 87  93  Resp: '18 18  17  '$ Temp: 98.5 F (36.9 C) 98.8 F (37.1 C)  98.1 F (36.7 C)  TempSrc: Oral Oral  Oral  SpO2: 100% 95%  94%  Weight:   80.8 kg   Height:       Physical Exam General:well appearing female in NAD Heart:RRR, no mrg Lungs:faint crackles in LLL, otherwise clear Abdomen:soft, NTND Extremities:trace LE edema Dialysis Access: [facility]   Filed Weights   10/07/22 0747 10/07/22 0748 10/08/22 0700  Weight: 78.3 kg 78.3 kg 80.8 kg    Intake/Output Summary (Last 24 hours) at 10/08/2022 1502 Last data filed at 10/08/2022 9024 Gross per 24 hour  Intake 872 ml  Output 0 ml  Net 872 ml    Additional Objective Labs: Basic Metabolic Panel: Recent Labs  Lab 10/06/22 0131 10/06/22 1856 10/07/22 0755 10/08/22 0516  NA 136  --  136 134*  K 4.8  --  5.0 5.3*  CL 95*  --  93* 95*  CO2 27  --  25 23  GLUCOSE 108*  --  103* 84  BUN 40*  --  48* 45*  CREATININE 9.76* 11.04* 11.94* 10.84*  CALCIUM 7.7*  --  7.8* 7.3*  PHOS  --   --  6.7* 5.5*   Liver Function Tests: Recent Labs  Lab 10/03/22 1805 10/06/22 0131 10/07/22 0755 10/08/22 0516  AST 15 26  --   --   ALT 12 18  --   --   ALKPHOS 95 100  --   --   BILITOT 0.5 0.5  --   --   PROT 7.7 8.1  --   --   ALBUMIN 3.6 3.5 3.0* 2.8*    CBC: Recent Labs  Lab 10/03/22 1805 10/06/22 0131 10/06/22 1856 10/07/22 0755 10/08/22 0516  WBC 18.0* 20.4* 16.0* 16.7* 13.0*  NEUTROABS 12.0* 14.8*  --   --   --   HGB 8.8* 8.3* 8.2* 7.8* 7.0*  HCT 25.2* 23.9* 23.0* 21.3* 19.7*  MCV 105.9* 106.2* 105.0* 102.9* 104.8*  PLT 388 375 344 335 284   CBG: Recent Labs  Lab  10/07/22 1127 10/07/22 1651 10/07/22 2028 10/08/22 0721 10/08/22 1130  GLUCAP 74 73 101* 76 114*    Studies/Results: IR Fluoro Guide CV Line Right  Result Date: 10/08/2022 INDICATION: 52 year old with end-stage renal disease. Poorly functioning tunneled dialysis catheter. Patient presents for catheter exchange. EXAM: FLUOROSCOPIC AND ULTRASOUND GUIDED PLACEMENT OF A TUNNELED DIALYSIS CATHETER Physician: Stephan Minister. Henn, MD MEDICATIONS: Ancef 2 g ANESTHESIA/SEDATION: 1% lidocaine for local anesthetic FLUOROSCOPY: Radiation Exposure Index (as provided by the fluoroscopic device): 8 mGy Kerma COMPLICATIONS: None immediate. PROCEDURE: The procedure was explained to the patient. The risks and benefits of the procedure were discussed and the patient's questions were addressed. Informed consent was obtained from the patient. Patient was placed supine on the interventional table. The right chest and existing catheter were prepped and draped in sterile fashion. Maximal barrier sterile technique was utilized including caps, mask, sterile gowns, sterile gloves, sterile drape, hand hygiene and skin antiseptic. Fluoroscopy demonstrated that the existing catheter  was well within the right atrium. The catheter cuff was easily exposed with mild traction. Skin around the catheter exit site was anesthetized using 1% lidocaine. Catheter was pulled back to the superior cavoatrial junction and both lumens aspirated and flushed well. However, the catheter cuff was exposed at this location. The existing catheter was removed over a stiff Glidewire and a new 19 cm Palindrome dialysis catheter was easily advanced over the wire. The cuff was placed underneath the skin. Catheter tip at the superior cavoatrial junction. Both lumens aspirated and flushed well. 1.6 mL heparin (1000 units/ml) was placed in both lumens. Catheter was secured to skin with suture. Both lumens were capped and clamped at the end of the procedure. Dressing was  placed. Fluoroscopic images were taken and saved for this procedure. FINDINGS: New 19 cm tip to cuff Palindrome catheter was placed. Catheter tip at the superior cavoatrial junction. IMPRESSION: Successful exchange of the tunneled dialysis catheter with fluoroscopy. Electronically Signed   By: Markus Daft M.D.   On: 10/08/2022 11:26   DG Chest 2 View  Result Date: 10/06/2022 CLINICAL DATA:  Cough EXAM: CHEST - 2 VIEW COMPARISON:  10/05/2022 FINDINGS: Cardiac shadow is stable. Right jugular dialysis catheter is noted and stable. Lungs are well aerated bilaterally. Minimal basilar atelectasis is seen. No bony abnormality is noted. IMPRESSION: Mild bibasilar atelectatic changes. Electronically Signed   By: Inez Catalina M.D.   On: 10/06/2022 21:01    Medications:  sodium chloride     ceFAZolin     cefTRIAXone (ROCEPHIN)  IV 2 g (10/07/22 2156)    atorvastatin  40 mg Oral Daily   carvedilol  6.25 mg Oral BID WC   Chlorhexidine Gluconate Cloth  6 each Topical Q0600   cholecalciferol  2,000 Units Oral Daily   cinacalcet  30 mg Oral Daily   heparin  5,000 Units Subcutaneous Q8H   hydroxyurea  500 mg Oral Daily   insulin aspart  0-9 Units Subcutaneous TID WC   multivitamin  1 tablet Oral Daily   [START ON 10/10/2022] patiromer  8.4 g Oral Once per day on Sun Tue   sevelamer carbonate  1,600 mg Oral TID WC   sodium chloride flush  3 mL Intravenous Q12H    Dialysis Orders: [facility] MWF 3hr 65mn RIJ TC, failed LUA loop 180NRe 400/500 EDW 74kg (left at 74.1 on 1/15) 1/2/2.5 UFR profile 2 Mircera 150 q2weeks (last given 1/10) Hectorol 255m   Assessment/Plan: Left sided pyelonephritis treated with Rocephin; positive for E.Coli on urine culture. On ABX. Cough with rales on the left side- CT angio noted no PE or consolidation. Atelectasis notes on CXR.  If weights correct 6kg over dry.  UF as tolerated with HD today.  ESRD on MWF regimen, missed treatment Wednesday- had cathflo overnight, unable to  complete HD yesterday d/t non functioning catheter.  [facility].  HTN/Vol - BP soft, nifedipine on hold, coreg decreased to 6.'25mg'$  BID - may need further reduction if BP remains low. BMM- CCa ok, phos at goal.  Continue Renvela 1-2 TIDM Anemia of CKD - Hgb 7.0. ESA due 1/24. does not appear to be in a sickle crisis at this time although she does sometimes have crisis with severe cold weather. Sickle Cell Disease - Does not appear to be in crisis.  Nutrition - Renal diet w/fluid restrictions.   LiJen MowPA-C Ca[facility]entucky[facility] 10/08/2022,3:02 PM  LOS: 1 day

## 2022-10-09 ENCOUNTER — Inpatient Hospital Stay (HOSPITAL_COMMUNITY): Payer: Medicare PPO

## 2022-10-09 DIAGNOSIS — N12 Tubulo-interstitial nephritis, not specified as acute or chronic: Secondary | ICD-10-CM | POA: Diagnosis not present

## 2022-10-09 DIAGNOSIS — N186 End stage renal disease: Secondary | ICD-10-CM | POA: Diagnosis not present

## 2022-10-09 DIAGNOSIS — D631 Anemia in chronic kidney disease: Secondary | ICD-10-CM | POA: Diagnosis not present

## 2022-10-09 DIAGNOSIS — R52 Pain, unspecified: Secondary | ICD-10-CM | POA: Diagnosis not present

## 2022-10-09 LAB — GLUCOSE, CAPILLARY
Glucose-Capillary: 69 mg/dL — ABNORMAL LOW (ref 70–99)
Glucose-Capillary: 133 mg/dL — ABNORMAL HIGH (ref 70–99)
Glucose-Capillary: 84 mg/dL (ref 70–99)
Glucose-Capillary: 105 mg/dL — ABNORMAL HIGH (ref 70–99)

## 2022-10-09 MED ORDER — IOHEXOL 350 MG/ML SOLN
75.0000 mL | Freq: Once | INTRAVENOUS | Status: AC | PRN
  Administered 2022-10-09: 75 mL via INTRAVENOUS

## 2022-10-09 MED ORDER — GUAIFENESIN-DM 100-10 MG/5ML PO SYRP
5.0000 mL | ORAL_SOLUTION | ORAL | Status: DC | PRN
  Administered 2022-10-09 – 2022-10-13 (×8): 5 mL via ORAL
  Filled 2022-10-09 (×9): qty 5

## 2022-10-09 NOTE — Progress Notes (Signed)
[facility] Progress Note   Subjective:   seen on dialysis- just finishing up.  [place] running well.  Still has some L flank pain.    Objective Vitals:   10/09/22 0700 10/09/22 0730 10/09/22 0800 10/09/22 0830  BP: 124/77 116/77 114/75 103/79  Pulse:      Resp: '17 16  16  '$ Temp:    98.4 F (36.9 C)  TempSrc:    Oral  SpO2:      Weight:      Height:       Physical Exam General:well appearing female in NAD Heart:RRR, no mrg Lungs:faint crackles in LLL, otherwise clear Abdomen:soft, NTND Extremities:trace LE edema Dialysis Access: [facility]   Filed Weights   10/08/22 0700 10/08/22 2032 10/09/22 0432  Weight: 80.8 kg 80.8 kg 79.4 kg    Intake/Output Summary (Last 24 hours) at 10/09/2022 0906 Last data filed at 10/09/2022 0830 Gross per 24 hour  Intake 600 ml  Output 1 ml  Net 599 ml    Additional Objective Labs: Basic Metabolic Panel: Recent Labs  Lab 10/06/22 0131 10/06/22 1856 10/07/22 0755 10/08/22 0516  NA 136  --  136 134*  K 4.8  --  5.0 5.3*  CL 95*  --  93* 95*  CO2 27  --  25 23  GLUCOSE 108*  --  103* 84  BUN 40*  --  48* 45*  CREATININE 9.76* 11.04* 11.94* 10.84*  CALCIUM 7.7*  --  7.8* 7.3*  PHOS  --   --  6.7* 5.5*   Liver Function Tests: Recent Labs  Lab 10/03/22 1805 10/06/22 0131 10/07/22 0755 10/08/22 0516  AST 15 26  --   --   ALT 12 18  --   --   ALKPHOS 95 100  --   --   BILITOT 0.5 0.5  --   --   PROT 7.7 8.1  --   --   ALBUMIN 3.6 3.5 3.0* 2.8*    CBC: Recent Labs  Lab 10/03/22 1805 10/06/22 0131 10/06/22 1856 10/07/22 0755 10/08/22 0516  WBC 18.0* 20.4* 16.0* 16.7* 13.0*  NEUTROABS 12.0* 14.8*  --   --   --   HGB 8.8* 8.3* 8.2* 7.8* 7.0*  HCT 25.2* 23.9* 23.0* 21.3* 19.7*  MCV 105.9* 106.2* 105.0* 102.9* 104.8*  PLT 388 375 344 335 284   CBG: Recent Labs  Lab 10/07/22 1651 10/07/22 2028 10/08/22 0721 10/08/22 1130 10/08/22 1627  GLUCAP 73 101* 76 114* 98    Studies/Results: IR Fluoro Guide CV  Line Right  Result Date: 10/08/2022 INDICATION: 52 year old with end-stage renal disease. Poorly functioning tunneled dialysis catheter. Patient presents for catheter exchange. EXAM: FLUOROSCOPIC AND ULTRASOUND GUIDED PLACEMENT OF A TUNNELED DIALYSIS CATHETER Physician: Stephan Minister. Henn, MD MEDICATIONS: Ancef 2 g ANESTHESIA/SEDATION: 1% lidocaine for local anesthetic FLUOROSCOPY: Radiation Exposure Index (as provided by the fluoroscopic device): 8 mGy Kerma COMPLICATIONS: None immediate. PROCEDURE: The procedure was explained to the patient. The risks and benefits of the procedure were discussed and the patient's questions were addressed. Informed consent was obtained from the patient. Patient was placed supine on the interventional table. The right chest and existing catheter were prepped and draped in sterile fashion. Maximal barrier sterile technique was utilized including caps, mask, sterile gowns, sterile gloves, sterile drape, hand hygiene and skin antiseptic. Fluoroscopy demonstrated that the existing catheter was well within the right atrium. The catheter cuff was easily exposed with mild traction. Skin around the catheter exit site was  anesthetized using 1% lidocaine. Catheter was pulled back to the superior cavoatrial junction and both lumens aspirated and flushed well. However, the catheter cuff was exposed at this location. The existing catheter was removed over a stiff Glidewire and a new 19 cm Palindrome dialysis catheter was easily advanced over the wire. The cuff was placed underneath the skin. Catheter tip at the superior cavoatrial junction. Both lumens aspirated and flushed well. 1.6 mL heparin (1000 units/ml) was placed in both lumens. Catheter was secured to skin with suture. Both lumens were capped and clamped at the end of the procedure. Dressing was placed. Fluoroscopic images were taken and saved for this procedure. FINDINGS: New 19 cm tip to cuff Palindrome catheter was placed. Catheter tip  at the superior cavoatrial junction. IMPRESSION: Successful exchange of the tunneled dialysis catheter with fluoroscopy. Electronically Signed   By: Markus Daft M.D.   On: 10/08/2022 11:26    Medications:  sodium chloride     cefTRIAXone (ROCEPHIN)  IV 2 g (10/08/22 2201)    atorvastatin  40 mg Oral Daily   carvedilol  6.25 mg Oral BID WC   Chlorhexidine Gluconate Cloth  6 each Topical Q0600   cholecalciferol  2,000 Units Oral Daily   cinacalcet  30 mg Oral Daily   heparin  5,000 Units Subcutaneous Q8H   hydroxyurea  500 mg Oral Daily   insulin aspart  0-9 Units Subcutaneous TID WC   multivitamin  1 tablet Oral Daily   [START ON 10/10/2022] patiromer  8.4 g Oral Once per day on Sun Tue   sevelamer carbonate  1,600 mg Oral TID WC   sodium chloride flush  3 mL Intravenous Q12H    Dialysis Orders: [facility] MWF 3hr 46mn RIJ TC, failed LUA loop 180NRe 400/500 EDW 74kg (left at 74.1 on 1/15) 1/2/2.5 UFR profile 2 Mircera 150 q2weeks (last given 1/10) Hectorol 262m   Assessment/Plan: Left sided pyelonephritis treated with Rocephin; positive for E.Coli on urine culture. On ABX. Cough with rales on the left side- CT angio noted no PE or consolidation. Atelectasis notes on CXR.  If weights correct 6kg over dry.  UF as tolerated with HD. ESRD on MWF regimen, missed treatment Wednesday- had cathflo overnight, unable to complete HD yesterday d/t non functioning catheter.  [facility], finishing up this AM.  Next Rx Monday HTN/Vol - BP soft, nifedipine on hold, coreg decreased to 6.'25mg'$  BID - may need further reduction if BP remains low. BMM- CCa ok, phos at goal.  Continue Renvela 1-2 TIDM Anemia of CKD - Hgb 7.0. ESA due 1/24. does not appear to be in a sickle crisis at this time although she does sometimes have crisis with severe cold weather. Sickle Cell Disease - Does not appear to be in crisis.  Nutrition - Renal diet w/fluid restrictions.   ElMadelon LipsMD [facility] 10/09/2022,9:06 AM  LOS: 2 days

## 2022-10-09 NOTE — Progress Notes (Signed)
   10/09/22 0830  Vitals  Temp 98.4 F (36.9 C)  Resp 16  BP 103/79  O2 Device Room Air  Oxygen Therapy  Patient Activity (if Appropriate) In bed  Pulse Oximetry Type Continuous  Post Treatment  Duration of HD Treatment -hour(s) 3.45 hour(s)  Liters Processed 90  Fluid Removed (mL) 1 mL  Tolerated HD Treatment Yes   Received patient in bed to unit.  Alert and oriented.  Informed consent signed and in chart.     Patient tolerated well.  Transported back to the room  Alert, without acute distress.  Hand-off given to patient's nurse.   Access used: [facility] Access issues: none  Total UF removed: 1L Medication(s) given: none    Na'Shaminy T [facility]

## 2022-10-09 NOTE — Procedures (Signed)
Patient seen and examined on Hemodialysis. The procedure was supervised and I have made appropriate changes. BP 103/79 (BP Location: Right Arm)   Pulse 79   Temp 98.4 F (36.9 C) (Oral)   Resp 16   Ht '5\' 2"'$  (1.575 m)   Wt 79.4 kg Comment: bed scale  LMP 09/14/2022   SpO2 99%   BMI 32.02 kg/m   QB 400 mL/ min via TDC, UF goal 2-2.5L  Tolerating treatment without complaints at this time.   Madelon Lips MD [facility] [phone] 9:07 AM

## 2022-10-09 NOTE — Plan of Care (Signed)
  Problem: Clinical Measurements: Goal: Cardiovascular complication will be avoided Outcome: Progressing   Problem: Activity: Goal: Risk for activity intolerance will decrease Outcome: Progressing   Problem: Nutrition: Goal: Adequate nutrition will be maintained Outcome: Progressing   Problem: Elimination: Goal: Will not experience complications related to bowel motility Outcome: Progressing

## 2022-10-09 NOTE — Progress Notes (Signed)
PROGRESS NOTE        PATIENT DETAILS Name: Julie Wells Age: 52 y.o. Sex: female Date of Birth: May 06, 1971 Admit Date: 10/06/2022 Admitting Physician Vianne Bulls, MD TWS:[email or web address], Merlyn Albert, MD  Brief Summary: Patient is a 52 y.o.  female with history of ESRD on HD MWF, HTN, DM-2, sickle cell disease, HLD-presented with left flank pain (second ED visit)-thought to have pyelonephritis and subsequently admitted to the hospitalist service.  Significant events: 1/14>> presented to [facility] with left flank pain-manage medically-discharge home. 1/17>> presented to [facility]-worsening left flank pain-urine culture on 1/14 positive for E. coli-admit to [facility].    Significant studies: 1/14>> CT abdomen/pelvis: 3.8 cm left and 3.7 cm right ovarian cyst.  Bilateral atrophic kidneys. 1/17>> CT angio chest: No PE, bibasilar atelectatic changes.  Significant microbiology data: 1/14>> urine culture: E. coli  Procedures: None  Consults: Nephrology  Subjective: Although overall better-still with left flank pain.  Very anxious about going home due to ongoing pain.  Objective: Vitals: Blood pressure 94/60, pulse 98, temperature 98 F (36.7 C), temperature source Oral, resp. rate 16, height '5\' 2"'$  (1.575 m), weight 79.4 kg, last menstrual period 09/14/2022, SpO2 100 %.   Exam: Gen Exam:Alert awake-not in any distress HEENT:atraumatic, normocephalic Chest: B/L clear to auscultation anteriorly CVS:S1S2 regular Abdomen:soft non tender, non distended.  Continues to have left CVA angle tenderness. Extremities:no edema Neurology: Non focal Skin: no rash  Pertinent Labs/Radiology:    Latest Ref Rng & Units 10/08/2022    5:16 AM 10/07/2022    7:55 AM 10/06/2022    6:56 PM  CBC  WBC 4.0 - 10.5 K/uL 13.0  16.7  16.0   Hemoglobin 12.0 - 15.0 g/dL 7.0  7.8  8.2   Hematocrit 36.0 - 46.0 % 19.7  21.3  23.0   Platelets 150 - 400 K/uL 284  335  344     Lab Results  Component Value  Date   NA 134 (L) 10/08/2022   K 5.3 (H) 10/08/2022   CL 95 (L) 10/08/2022   CO2 23 10/08/2022      Assessment/Plan: Severe left flank pain-likely due to pyelonephritis involving left kidney-recent urine culture on 1/14 positive for E. coli Although overall improved-continues to have left flank pain Afebrile-leukocytosis slowly downtrending Continues to be on Rocephin Discussed with radiologist-Dr. Clovis Riley over the phone-he is reevaluated CT abdomen and CT angio chest studies done several days ago-no obvious kidney lesions-no vascular lesions suggestive of renal infarction.  Minimal left airspace disease in the left lung base on most recent CT angio chest-this was not there on initial CT abdomen.  Suggest to be repeat CT to see if any new pathology has developed in the interim.  Continue supportive care-as needed narcotics/muscle relaxants.  ESRD on HD MWF Nephrology following for HD needs  Malfunctioning tunneled HD catheter Catheter exchanged by IR on 1/19-underwent HD earlier this morning.  Hyperkalemia Mild Due to missed HD Should be better with dialysis earlier this morning Repeat electrolytes tomorrow   Normocytic anemia Due to combination of ESRD and sickle cell disease Worsening Hb likely due to acute illness Follow CBC-transfuse if significant drop   HTN BP still soft Nifedipine on hold Cautiously continue with reduced dose Coreg Continue to reassess and readjust medications.   HLD Continue statin   Sickle cell disease Does not appear to be in  crisis-as this current clinical presentation is not very typical of her usual sickle cell crisis pain Will monitor for now   Bilateral ovarian cyst Seen incidentally on CT abdomen on 1/14 Likely not the cause for her left flank pain Outpatient follow-up with PCP/GYN  Obesity: Estimated body mass index is 32.02 kg/m as calculated from the following:   Height as of this encounter: '5\' 2"'$  (1.575 m).   Weight as of this  encounter: 79.4 kg.   Code status:   Code Status: Full Code   DVT Prophylaxis: heparin injection 5,000 Units Start: 10/06/22 2200   Family Communication: None at bedside   Disposition Plan: Status is: Observation The patient will require care spanning > 2 midnights and should be moved to inpatient because: Severity of illness   Planned Discharge Destination:Home hopefully in the next 1-2 days.   Diet: Diet Order             Diet renal/carb modified with fluid restriction Diet-HS Snack? Nothing; Fluid restriction: 1200 mL Fluid; Room service appropriate? Yes; Fluid consistency: Thin  Diet effective now                     Antimicrobial agents: Anti-infectives (From admission, onward)    Start     Dose/Rate Route Frequency Ordered Stop   10/08/22 1145  ceFAZolin (ANCEF) IVPB 2g/100 mL premix        2 g 200 mL/hr over 30 Minutes Intravenous  Once 10/08/22 1059 10/08/22 1138   10/08/22 1101  ceFAZolin (ANCEF) 2-4 GM/100ML-% IVPB       Note to Pharmacy: Trixie Dredge C: cabinet override      10/08/22 1101 10/08/22 2314   10/06/22 2200  cefTRIAXone (ROCEPHIN) 2 g in sodium chloride 0.9 % 100 mL IVPB        2 g 200 mL/hr over 30 Minutes Intravenous Every 24 hours 10/06/22 1732     10/06/22 0230  cefTRIAXone (ROCEPHIN) 1 g in sodium chloride 0.9 % 100 mL IVPB        1 g 200 mL/hr over 30 Minutes Intravenous  Once 10/06/22 0220 10/06/22 0326        MEDICATIONS: Scheduled Meds:  atorvastatin  40 mg Oral Daily   carvedilol  6.25 mg Oral BID WC   Chlorhexidine Gluconate Cloth  6 each Topical Q0600   cholecalciferol  2,000 Units Oral Daily   cinacalcet  30 mg Oral Daily   heparin  5,000 Units Subcutaneous Q8H   hydroxyurea  500 mg Oral Daily   insulin aspart  0-9 Units Subcutaneous TID WC   multivitamin  1 tablet Oral Daily   [START ON 10/10/2022] patiromer  8.4 g Oral Once per day on Sun Tue   sevelamer carbonate  1,600 mg Oral TID WC   sodium chloride flush  3  mL Intravenous Q12H   Continuous Infusions:  sodium chloride     cefTRIAXone (ROCEPHIN)  IV 2 g (10/08/22 2201)   PRN Meds:.sodium chloride, acetaminophen **OR** acetaminophen, cyclobenzaprine, guaiFENesin-dextromethorphan, hydrALAZINE, HYDROmorphone (DILAUDID) injection, ondansetron **OR** ondansetron (ZOFRAN) IV, oxyCODONE, polyethylene glycol, sodium chloride flush   I have personally reviewed following labs and imaging studies  LABORATORY DATA: CBC: Recent Labs  Lab 10/03/22 1805 10/06/22 0131 10/06/22 1856 10/07/22 0755 10/08/22 0516  WBC 18.0* 20.4* 16.0* 16.7* 13.0*  NEUTROABS 12.0* 14.8*  --   --   --   HGB 8.8* 8.3* 8.2* 7.8* 7.0*  HCT 25.2* 23.9* 23.0* 21.3* 19.7*  MCV  105.9* 106.2* 105.0* 102.9* 104.8*  PLT 388 375 344 335 284     Basic Metabolic Panel: Recent Labs  Lab 10/03/22 1805 10/06/22 0131 10/06/22 1856 10/07/22 0755 10/08/22 0516  NA 136 136  --  136 134*  K 4.7 4.8  --  5.0 5.3*  CL 95* 95*  --  93* 95*  CO2 27 27  --  25 23  GLUCOSE 90 108*  --  103* 84  BUN 38* 40*  --  48* 45*  CREATININE 10.66* 9.76* 11.04* 11.94* 10.84*  CALCIUM 7.7* 7.7*  --  7.8* 7.3*  PHOS  --   --   --  6.7* 5.5*     GFR: Estimated Creatinine Clearance: 5.9 mL/min (A) (by C-G formula based on SCr of 10.84 mg/dL (H)).  Liver Function Tests: Recent Labs  Lab 10/03/22 1805 10/06/22 0131 10/07/22 0755 10/08/22 0516  AST 15 26  --   --   ALT 12 18  --   --   ALKPHOS 95 100  --   --   BILITOT 0.5 0.5  --   --   PROT 7.7 8.1  --   --   ALBUMIN 3.6 3.5 3.0* 2.8*    No results for input(s): "LIPASE", "AMYLASE" in the last 168 hours. No results for input(s): "AMMONIA" in the last 168 hours.  Coagulation Profile: No results for input(s): "INR", "PROTIME" in the last 168 hours.  Cardiac Enzymes: No results for input(s): "CKTOTAL", "CKMB", "CKMBINDEX", "TROPONINI" in the last 168 hours.  BNP (last 3 results) No results for input(s): "PROBNP" in the last  8760 hours.  Lipid Profile: No results for input(s): "CHOL", "HDL", "LDLCALC", "TRIG", "CHOLHDL", "LDLDIRECT" in the last 72 hours.  Thyroid Function Tests: No results for input(s): "TSH", "T4TOTAL", "FREET4", "T3FREE", "THYROIDAB" in the last 72 hours.  Anemia Panel: No results for input(s): "VITAMINB12", "FOLATE", "FERRITIN", "TIBC", "IRON", "RETICCTPCT" in the last 72 hours.  Urine analysis:    Component Value Date/Time   COLORURINE YELLOW 10/06/2022 1317   APPEARANCEUR HAZY (A) 10/06/2022 1317   LABSPEC 1.015 10/06/2022 1317   PHURINE 8.5 (H) 10/06/2022 1317   GLUCOSEU NEGATIVE 10/06/2022 1317   HGBUR SMALL (A) 10/06/2022 1317   BILIRUBINUR NEGATIVE 10/06/2022 1317   KETONESUR NEGATIVE 10/06/2022 1317   PROTEINUR 100 (A) 10/06/2022 1317   UROBILINOGEN 0.2 02/13/2015 1823   NITRITE NEGATIVE 10/06/2022 1317   LEUKOCYTESUR SMALL (A) 10/06/2022 1317    Sepsis Labs: Lactic Acid, Venous No results found for: "LATICACIDVEN"  MICROBIOLOGY: Recent Results (from the past 240 hour(s))  Resp panel by RT-PCR (RSV, Flu A&B, Covid) Nasopharyngeal Swab     Status: None   Collection Time: 10/03/22  3:38 PM   Specimen: Nasopharyngeal Swab; Nasal Swab  Result Value Ref Range Status   SARS Coronavirus 2 by RT PCR NEGATIVE NEGATIVE Final    Comment: (NOTE) SARS-CoV-2 target nucleic acids are NOT DETECTED.  The SARS-CoV-2 RNA is generally detectable in upper respiratory specimens during the acute phase of infection. The lowest concentration of SARS-CoV-2 viral copies this assay can detect is 138 copies/mL. A negative result does not preclude SARS-Cov-2 infection and should not be used as the sole basis for treatment or other patient management decisions. A negative result may occur with  improper specimen collection/handling, submission of specimen other than nasopharyngeal swab, presence of viral mutation(s) within the areas targeted by this assay, and inadequate number of  viral copies(<138 copies/mL). A negative result must be combined with  clinical observations, patient history, and epidemiological information. The expected result is Negative.  Fact Sheet for Patients:  EntrepreneurPulse.com.au  Fact Sheet for Healthcare Providers:  IncredibleEmployment.be  This test is no t yet approved or cleared by the [place] FDA and  has been authorized for detection and/or diagnosis of SARS-CoV-2 by FDA under an Emergency Use Authorization (EUA). This EUA will remain  in effect (meaning this test can be used) for the duration of the COVID-19 declaration under Section 564(b)(1) of the Act, 21 U.S.C.section 360bbb-3(b)(1), unless the authorization is terminated  or revoked sooner.       Influenza A by PCR NEGATIVE NEGATIVE Final   Influenza B by PCR NEGATIVE NEGATIVE Final    Comment: (NOTE) The Xpert Xpress SARS-CoV-2/FLU/RSV plus assay is intended as an aid in the diagnosis of influenza from Nasopharyngeal swab specimens and should not be used as a sole basis for treatment. Nasal washings and aspirates are unacceptable for Xpert Xpress SARS-CoV-2/FLU/RSV testing.  Fact Sheet for Patients: EntrepreneurPulse.com.au  Fact Sheet for Healthcare Providers: IncredibleEmployment.be  This test is not yet approved or cleared by the [place] FDA and has been authorized for detection and/or diagnosis of SARS-CoV-2 by FDA under an Emergency Use Authorization (EUA). This EUA will remain in effect (meaning this test can be used) for the duration of the COVID-19 declaration under Section 564(b)(1) of the Act, 21 U.S.C. section 360bbb-3(b)(1), unless the authorization is terminated or revoked.     Resp Syncytial Virus by PCR NEGATIVE NEGATIVE Final    Comment: (NOTE) Fact Sheet for Patients: EntrepreneurPulse.com.au  Fact Sheet for Healthcare  Providers: IncredibleEmployment.be  This test is not yet approved or cleared by the [place] FDA and has been authorized for detection and/or diagnosis of SARS-CoV-2 by FDA under an Emergency Use Authorization (EUA). This EUA will remain in effect (meaning this test can be used) for the duration of the COVID-19 declaration under Section 564(b)(1) of the Act, 21 U.S.C. section 360bbb-3(b)(1), unless the authorization is terminated or revoked.  Performed at [facility], [address] [place], [place] [address]   Urine Culture     Status: Abnormal   Collection Time: 10/03/22 10:08 PM   Specimen: Urine, Clean Catch  Result Value Ref Range Status   Specimen Description   Final    URINE, CLEAN CATCH Performed at [facility], [address] [place], [place] [address]    Special Requests   Final    NONE Performed at [facility], [address] [place], [place] [address]    Culture >=100,000 COLONIES/mL ESCHERICHIA COLI (A)  Final   Report Status 10/06/2022 FINAL  Final   Organism ID, Bacteria ESCHERICHIA COLI (A)  Final      Susceptibility   Escherichia coli - MIC*    AMPICILLIN <=2 SENSITIVE Sensitive     CEFAZOLIN <=4 SENSITIVE Sensitive     CEFEPIME <=0.12 SENSITIVE Sensitive     CEFTRIAXONE <=0.25 SENSITIVE Sensitive     CIPROFLOXACIN <=0.25 SENSITIVE Sensitive     GENTAMICIN <=1 SENSITIVE Sensitive     IMIPENEM <=0.25 SENSITIVE Sensitive     NITROFURANTOIN <=16 SENSITIVE Sensitive     TRIMETH/SULFA <=20 SENSITIVE Sensitive     AMPICILLIN/SULBACTAM <=2 SENSITIVE Sensitive     PIP/TAZO <=4 SENSITIVE Sensitive     * >=100,000 COLONIES/mL ESCHERICHIA COLI    RADIOLOGY STUDIES/RESULTS: IR Fluoro Guide CV Line Right  Result Date: 10/08/2022 INDICATION: 52 year old with end-stage  renal disease. Poorly functioning tunneled dialysis catheter. Patient presents for catheter exchange. EXAM: FLUOROSCOPIC AND ULTRASOUND  GUIDED PLACEMENT OF A TUNNELED DIALYSIS CATHETER Physician: Stephan Minister. Henn, MD MEDICATIONS: Ancef 2 g ANESTHESIA/SEDATION: 1% lidocaine for local anesthetic FLUOROSCOPY: Radiation Exposure Index (as provided by the fluoroscopic device): 8 mGy Kerma COMPLICATIONS: None immediate. PROCEDURE: The procedure was explained to the patient. The risks and benefits of the procedure were discussed and the patient's questions were addressed. Informed consent was obtained from the patient. Patient was placed supine on the interventional table. The right chest and existing catheter were prepped and draped in sterile fashion. Maximal barrier sterile technique was utilized including caps, mask, sterile gowns, sterile gloves, sterile drape, hand hygiene and skin antiseptic. Fluoroscopy demonstrated that the existing catheter was well within the right atrium. The catheter cuff was easily exposed with mild traction. Skin around the catheter exit site was anesthetized using 1% lidocaine. Catheter was pulled back to the superior cavoatrial junction and both lumens aspirated and flushed well. However, the catheter cuff was exposed at this location. The existing catheter was removed over a stiff Glidewire and a new 19 cm Palindrome dialysis catheter was easily advanced over the wire. The cuff was placed underneath the skin. Catheter tip at the superior cavoatrial junction. Both lumens aspirated and flushed well. 1.6 mL heparin (1000 units/ml) was placed in both lumens. Catheter was secured to skin with suture. Both lumens were capped and clamped at the end of the procedure. Dressing was placed. Fluoroscopic images were taken and saved for this procedure. FINDINGS: New 19 cm tip to cuff Palindrome catheter was placed. Catheter tip at the superior cavoatrial junction. IMPRESSION: Successful exchange of the tunneled dialysis catheter with fluoroscopy. Electronically Signed   By: Markus Daft M.D.   On: 10/08/2022 11:26     LOS: 2 days    Oren Binet, MD  [facility]    To contact the attending provider between 7A-7P or the covering provider during after hours 7P-7A, please log into the web site [email or web address] and access using universal [place] password for that web site. If you do not have the password, please call the hospital operator.  10/09/2022, 10:03 AM

## 2022-10-10 DIAGNOSIS — R52 Pain, unspecified: Secondary | ICD-10-CM | POA: Diagnosis not present

## 2022-10-10 DIAGNOSIS — N186 End stage renal disease: Secondary | ICD-10-CM | POA: Diagnosis not present

## 2022-10-10 DIAGNOSIS — D571 Sickle-cell disease without crisis: Secondary | ICD-10-CM | POA: Diagnosis not present

## 2022-10-10 DIAGNOSIS — D631 Anemia in chronic kidney disease: Secondary | ICD-10-CM | POA: Diagnosis not present

## 2022-10-10 LAB — GLUCOSE, CAPILLARY
Glucose-Capillary: 89 mg/dL (ref 70–99)
Glucose-Capillary: 88 mg/dL (ref 70–99)
Glucose-Capillary: 93 mg/dL (ref 70–99)
Glucose-Capillary: 98 mg/dL (ref 70–99)
Glucose-Capillary: 110 mg/dL — ABNORMAL HIGH (ref 70–99)

## 2022-10-10 LAB — RENAL FUNCTION PANEL
Albumin: 2.9 g/dL — ABNORMAL LOW (ref 3.5–5.0)
Anion gap: 12 (ref 5–15)
BUN: 30 mg/dL — ABNORMAL HIGH (ref 6–20)
CO2: 25 mmol/L (ref 22–32)
Calcium: 7.9 mg/dL — ABNORMAL LOW (ref 8.9–10.3)
Chloride: 96 mmol/L — ABNORMAL LOW (ref 98–111)
Creatinine, Ser: 7.88 mg/dL — ABNORMAL HIGH (ref 0.44–1.00)
GFR, Estimated: 6 mL/min — ABNORMAL LOW (ref >60)
Glucose, Bld: 107 mg/dL — ABNORMAL HIGH (ref 70–99)
Phosphorus: 4.9 mg/dL — ABNORMAL HIGH (ref 2.5–4.6)
Potassium: 4.9 mmol/L (ref 3.5–5.1)
Sodium: 133 mmol/L — ABNORMAL LOW (ref 135–145)

## 2022-10-10 LAB — CBC
HCT: 20.6 % — ABNORMAL LOW (ref 36.0–46.0)
Hemoglobin: 7.2 g/dL — ABNORMAL LOW (ref 12.0–15.0)
MCH: 36.2 pg — ABNORMAL HIGH (ref 26.0–34.0)
MCHC: 35 g/dL (ref 30.0–36.0)
MCV: 103.5 fL — ABNORMAL HIGH (ref 80.0–100.0)
Platelets: 353 10*3/uL (ref 150–400)
RBC: 1.99 MIL/uL — ABNORMAL LOW (ref 3.87–5.11)
RDW: 19.6 % — ABNORMAL HIGH (ref 11.5–15.5)
WBC: 15 10*3/uL — ABNORMAL HIGH (ref 4.0–10.5)
nRBC: 3.7 % — ABNORMAL HIGH (ref 0.0–0.2)

## 2022-10-10 MED ORDER — IPRATROPIUM-ALBUTEROL 0.5-2.5 (3) MG/3ML IN SOLN
3.0000 mL | Freq: Three times a day (TID) | RESPIRATORY_TRACT | Status: DC
  Administered 2022-10-10 – 2022-10-12 (×6): 3 mL via RESPIRATORY_TRACT
  Filled 2022-10-10 (×7): qty 3

## 2022-10-10 MED ORDER — AZITHROMYCIN 500 MG PO TABS
500.0000 mg | ORAL_TABLET | Freq: Every day | ORAL | Status: AC
  Administered 2022-10-10 – 2022-10-12 (×3): 500 mg via ORAL
  Filled 2022-10-10 (×3): qty 1

## 2022-10-10 MED ORDER — CARVEDILOL 3.125 MG PO TABS
3.1250 mg | ORAL_TABLET | Freq: Two times a day (BID) | ORAL | Status: DC
  Administered 2022-10-10 – 2022-10-13 (×3): 3.125 mg via ORAL
  Filled 2022-10-10 (×6): qty 1

## 2022-10-10 MED ORDER — IPRATROPIUM-ALBUTEROL 0.5-2.5 (3) MG/3ML IN SOLN: 3.0000 mL | RESPIRATORY_TRACT | Status: DC | PRN

## 2022-10-10 MED ORDER — CHLORHEXIDINE GLUCONATE CLOTH 2 % EX PADS
6.0000 | MEDICATED_PAD | Freq: Every day | CUTANEOUS | Status: DC
  Administered 2022-10-11 – 2022-10-13 (×3): 6 via TOPICAL

## 2022-10-10 MED ORDER — ORAL CARE MOUTH RINSE: 15.0000 mL | OROMUCOSAL | Status: DC | PRN

## 2022-10-10 MED ORDER — ALBUTEROL SULFATE (2.5 MG/3ML) 0.083% IN NEBU
2.5000 mg | INHALATION_SOLUTION | RESPIRATORY_TRACT | Status: DC | PRN
  Administered 2022-10-12 – 2022-10-13 (×2): 2.5 mg via RESPIRATORY_TRACT
  Filled 2022-10-10 (×2): qty 3

## 2022-10-10 NOTE — Plan of Care (Signed)
  Problem: Clinical Measurements: Goal: Cardiovascular complication will be avoided Outcome: Progressing   Problem: Activity: Goal: Risk for activity intolerance will decrease Outcome: Progressing   Problem: Nutrition: Goal: Adequate nutrition will be maintained Outcome: Progressing   Problem: Coping: Goal: Level of anxiety will decrease Outcome: Progressing   Problem: Pain Managment: Goal: General experience of comfort will improve Outcome: Progressing

## 2022-10-10 NOTE — Progress Notes (Signed)
PROGRESS NOTE        PATIENT DETAILS Name: Julie Wells Age: 52 y.o. Sex: female Date of Birth: March 19, 1971 Admit Date: 10/06/2022 Admitting Physician Vianne Bulls, MD ZDG:LOVFI, Merlyn Albert, MD  Brief Summary: Patient is a 52 y.o.  female with history of ESRD on HD MWF, HTN, DM-2, sickle cell disease, HLD-presented with left flank pain (second ED visit)-thought to have pyelonephritis and subsequently admitted to the hospitalist service.  Significant events: 1/14>> presented to [facility] with left flank pain-manage medically-discharge home. 1/17>> presented to [facility]-worsening left flank pain-urine culture on 1/14 positive for E. coli-admit to [facility].    Significant studies: 1/14>> CT abdomen/pelvis: 3.8 cm left and 3.7 cm right ovarian cyst.  Bilateral atrophic kidneys. 1/17>> CT angio chest: No PE, bibasilar atelectatic changes. 1/20>> CT abdomen/pelvis: No acute findings in the abdomen.  Nodular consolidative opacity left lower lobe-likely atelectatic-PNA not excluded.  Significant microbiology data: 1/14>> urine culture: E. coli  Procedures: None  Consults: Nephrology  Subjective: More cough today.  Some wheezing.  Continues to have pain in the left flank-around 5-6/10 in intensity.  He is comfortable.  Objective: Vitals: Blood pressure 126/77, pulse 87, temperature 98.4 F (36.9 C), temperature source Oral, resp. rate 18, height '5\' 2"'$  (1.575 m), weight 79.4 kg, last menstrual period 09/14/2022, SpO2 99 %.   Exam: Gen Exam:Alert awake-not in any distress HEENT:atraumatic, normocephalic Chest: Scattered rhonchi in all lung fields. CVS:S1S2 regular Abdomen:soft non tender, non distended.  Continues to have some moderate left CVA tenderness. Extremities:no edema Neurology: Non focal Skin: no rash  Pertinent Labs/Radiology:    Latest Ref Rng & Units 10/10/2022    7:34 AM 10/08/2022    5:16 AM 10/07/2022    7:55 AM  CBC  WBC 4.0 - 10.5 K/uL 15.0   13.0  16.7   Hemoglobin 12.0 - 15.0 g/dL 7.2  7.0  7.8   Hematocrit 36.0 - 46.0 % 20.6  19.7  21.3   Platelets 150 - 400 K/uL 353  284  335     Lab Results  Component Value Date   NA 133 (L) 10/10/2022   K 4.9 10/10/2022   CL 96 (L) 10/10/2022   CO2 25 10/10/2022      Assessment/Plan: Severe left flank pain-likely due to pyelonephritis involving left kidney-and now due to developing PNA at left lung base  Overall improved Continues to have left flank pain Some more cough today-wheezing-appears to have developed atelectasis/early pneumonia in left lung base-likely from splinting. Add Zithromax-continue Rocephin Add bronchodilators-encourage incentive spirometry/mobility Repeat CTAbdomen on 1/20-no major abnormalities seen in the left kidney.  Had also spoken with radiology MD on 1/20-see my note.  ESRD on HD MWF Nephrology following for HD needs  Malfunctioning tunneled HD catheter Catheter exchanged by IR on 1/19-underwent HD earlier this morning.  Hyperkalemia Mild Due to missed HD Resolved with HD   Normocytic anemia Due to combination of ESRD and sickle cell disease Worsening Hb likely due to acute illness Follow CBC-transfuse if significant drop   HTN BP still soft Nifedipine on hold Cautiously continue with reduced dose Coreg Continue to reassess and readjust medications.   HLD Continue statin   Sickle cell disease Does not appear to be in crisis-as this current clinical presentation is not very typical of her usual sickle cell crisis pain Will monitor for now  Bilateral ovarian cyst Seen incidentally on CT abdomen on 1/14 Likely not the cause for her left flank pain Outpatient follow-up with PCP/GYN  9 mm right kidney lesion Radiology recommending repeat MRI in 3 months to ensure stability.  Obesity: Estimated body mass index is 32.02 kg/m as calculated from the following:   Height as of this encounter: '5\' 2"'$  (1.575 m).   Weight as of this  encounter: 79.4 kg.   Code status:   Code Status: Full Code   DVT Prophylaxis: heparin injection 5,000 Units Start: 10/06/22 2200   Family Communication: Spouse at bedside   Disposition Plan: Status is: Observation The patient will require care spanning > 2 midnights and should be moved to inpatient because: Severity of illness   Planned Discharge Destination:Home hopefully in the next 1-2 days.   Diet: Diet Order             Diet renal/carb modified with fluid restriction Diet-HS Snack? Nothing; Fluid restriction: 1200 mL Fluid; Room service appropriate? Yes; Fluid consistency: Thin  Diet effective now                     Antimicrobial agents: Anti-infectives (From admission, onward)    Start     Dose/Rate Route Frequency Ordered Stop   10/10/22 1000  azithromycin (ZITHROMAX) tablet 500 mg        500 mg Oral Daily 10/10/22 0735 10/13/22 0959   10/08/22 1145  ceFAZolin (ANCEF) IVPB 2g/100 mL premix        2 g 200 mL/hr over 30 Minutes Intravenous  Once 10/08/22 1059 10/08/22 1138   10/08/22 1101  ceFAZolin (ANCEF) 2-4 GM/100ML-% IVPB       Note to Pharmacy: Trixie Dredge C: cabinet override      10/08/22 1101 10/08/22 2314   10/06/22 2200  cefTRIAXone (ROCEPHIN) 2 g in sodium chloride 0.9 % 100 mL IVPB        2 g 200 mL/hr over 30 Minutes Intravenous Every 24 hours 10/06/22 1732     10/06/22 0230  cefTRIAXone (ROCEPHIN) 1 g in sodium chloride 0.9 % 100 mL IVPB        1 g 200 mL/hr over 30 Minutes Intravenous  Once 10/06/22 0220 10/06/22 0326        MEDICATIONS: Scheduled Meds:  atorvastatin  40 mg Oral Daily   azithromycin  500 mg Oral Daily   carvedilol  6.25 mg Oral BID WC   Chlorhexidine Gluconate Cloth  6 each Topical Q0600   cholecalciferol  2,000 Units Oral Daily   cinacalcet  30 mg Oral Daily   heparin  5,000 Units Subcutaneous Q8H   hydroxyurea  500 mg Oral Daily   insulin aspart  0-9 Units Subcutaneous TID WC   ipratropium-albuterol  3 mL  Nebulization TID   multivitamin  1 tablet Oral Daily   patiromer  8.4 g Oral Once per day on Sun Tue   sevelamer carbonate  1,600 mg Oral TID WC   sodium chloride flush  3 mL Intravenous Q12H   Continuous Infusions:  sodium chloride     cefTRIAXone (ROCEPHIN)  IV 2 g (10/09/22 2106)   PRN Meds:.sodium chloride, acetaminophen **OR** acetaminophen, cyclobenzaprine, guaiFENesin-dextromethorphan, hydrALAZINE, HYDROmorphone (DILAUDID) injection, ondansetron **OR** ondansetron (ZOFRAN) IV, oxyCODONE, polyethylene glycol, sodium chloride flush   I have personally reviewed following labs and imaging studies  LABORATORY DATA: CBC: Recent Labs  Lab 10/03/22 1805 10/06/22 0131 10/06/22 1856 10/07/22 0755 10/08/22 0516 10/10/22 0734  WBC  18.0* 20.4* 16.0* 16.7* 13.0* 15.0*  NEUTROABS 12.0* 14.8*  --   --   --   --   HGB 8.8* 8.3* 8.2* 7.8* 7.0* 7.2*  HCT 25.2* 23.9* 23.0* 21.3* 19.7* 20.6*  MCV 105.9* 106.2* 105.0* 102.9* 104.8* 103.5*  PLT 388 375 344 335 284 353     Basic Metabolic Panel: Recent Labs  Lab 10/03/22 1805 10/06/22 0131 10/06/22 1856 10/07/22 0755 10/08/22 0516 10/10/22 0734  NA 136 136  --  136 134* 133*  K 4.7 4.8  --  5.0 5.3* 4.9  CL 95* 95*  --  93* 95* 96*  CO2 27 27  --  '25 23 25  '$ GLUCOSE 90 108*  --  103* 84 107*  BUN 38* 40*  --  48* 45* 30*  CREATININE 10.66* 9.76* 11.04* 11.94* 10.84* 7.88*  CALCIUM 7.7* 7.7*  --  7.8* 7.3* 7.9*  PHOS  --   --   --  6.7* 5.5* 4.9*     GFR: Estimated Creatinine Clearance: 8.1 mL/min (A) (by C-G formula based on SCr of 7.88 mg/dL (H)).  Liver Function Tests: Recent Labs  Lab 10/03/22 1805 10/06/22 0131 10/07/22 0755 10/08/22 0516 10/10/22 0734  AST 15 26  --   --   --   ALT 12 18  --   --   --   ALKPHOS 95 100  --   --   --   BILITOT 0.5 0.5  --   --   --   PROT 7.7 8.1  --   --   --   ALBUMIN 3.6 3.5 3.0* 2.8* 2.9*    No results for input(s): "LIPASE", "AMYLASE" in the last 168 hours. No results  for input(s): "AMMONIA" in the last 168 hours.  Coagulation Profile: No results for input(s): "INR", "PROTIME" in the last 168 hours.  Cardiac Enzymes: No results for input(s): "CKTOTAL", "CKMB", "CKMBINDEX", "TROPONINI" in the last 168 hours.  BNP (last 3 results) No results for input(s): "PROBNP" in the last 8760 hours.  Lipid Profile: No results for input(s): "CHOL", "HDL", "LDLCALC", "TRIG", "CHOLHDL", "LDLDIRECT" in the last 72 hours.  Thyroid Function Tests: No results for input(s): "TSH", "T4TOTAL", "FREET4", "T3FREE", "THYROIDAB" in the last 72 hours.  Anemia Panel: No results for input(s): "VITAMINB12", "FOLATE", "FERRITIN", "TIBC", "IRON", "RETICCTPCT" in the last 72 hours.  Urine analysis:    Component Value Date/Time   COLORURINE YELLOW 10/06/2022 1317   APPEARANCEUR HAZY (A) 10/06/2022 1317   LABSPEC 1.015 10/06/2022 1317   PHURINE 8.5 (H) 10/06/2022 1317   GLUCOSEU NEGATIVE 10/06/2022 1317   HGBUR SMALL (A) 10/06/2022 1317   BILIRUBINUR NEGATIVE 10/06/2022 1317   KETONESUR NEGATIVE 10/06/2022 1317   PROTEINUR 100 (A) 10/06/2022 1317   UROBILINOGEN 0.2 02/13/2015 1823   NITRITE NEGATIVE 10/06/2022 1317   LEUKOCYTESUR SMALL (A) 10/06/2022 1317    Sepsis Labs: Lactic Acid, Venous No results found for: "LATICACIDVEN"  MICROBIOLOGY: Recent Results (from the past 240 hour(s))  Resp panel by RT-PCR (RSV, Flu A&B, Covid) Nasopharyngeal Swab     Status: None   Collection Time: 10/03/22  3:38 PM   Specimen: Nasopharyngeal Swab; Nasal Swab  Result Value Ref Range Status   SARS Coronavirus 2 by RT PCR NEGATIVE NEGATIVE Final    Comment: (NOTE) SARS-CoV-2 target nucleic acids are NOT DETECTED.  The SARS-CoV-2 RNA is generally detectable in upper respiratory specimens during the acute phase of infection. The lowest concentration of SARS-CoV-2 viral copies this assay can  detect is 138 copies/mL. A negative result does not preclude SARS-Cov-2 infection and should  not be used as the sole basis for treatment or other patient management decisions. A negative result may occur with  improper specimen collection/handling, submission of specimen other than nasopharyngeal swab, presence of viral mutation(s) within the areas targeted by this assay, and inadequate number of viral copies(<138 copies/mL). A negative result must be combined with clinical observations, patient history, and epidemiological information. The expected result is Negative.  Fact Sheet for Patients:  EntrepreneurPulse.com.au  Fact Sheet for Healthcare Providers:  IncredibleEmployment.be  This test is no t yet approved or cleared by the [place] FDA and  has been authorized for detection and/or diagnosis of SARS-CoV-2 by FDA under an Emergency Use Authorization (EUA). This EUA will remain  in effect (meaning this test can be used) for the duration of the COVID-19 declaration under Section 564(b)(1) of the Act, 21 U.S.C.section 360bbb-3(b)(1), unless the authorization is terminated  or revoked sooner.       Influenza A by PCR NEGATIVE NEGATIVE Final   Influenza B by PCR NEGATIVE NEGATIVE Final    Comment: (NOTE) The Xpert Xpress SARS-CoV-2/FLU/RSV plus assay is intended as an aid in the diagnosis of influenza from Nasopharyngeal swab specimens and should not be used as a sole basis for treatment. Nasal washings and aspirates are unacceptable for Xpert Xpress SARS-CoV-2/FLU/RSV testing.  Fact Sheet for Patients: EntrepreneurPulse.com.au  Fact Sheet for Healthcare Providers: IncredibleEmployment.be  This test is not yet approved or cleared by the [place] FDA and has been authorized for detection and/or diagnosis of SARS-CoV-2 by FDA under an Emergency Use Authorization (EUA). This EUA will remain in effect (meaning this test can be used) for the duration of the COVID-19 declaration under  Section 564(b)(1) of the Act, 21 U.S.C. section 360bbb-3(b)(1), unless the authorization is terminated or revoked.     Resp Syncytial Virus by PCR NEGATIVE NEGATIVE Final    Comment: (NOTE) Fact Sheet for Patients: EntrepreneurPulse.com.au  Fact Sheet for Healthcare Providers: IncredibleEmployment.be  This test is not yet approved or cleared by the [place] FDA and has been authorized for detection and/or diagnosis of SARS-CoV-2 by FDA under an Emergency Use Authorization (EUA). This EUA will remain in effect (meaning this test can be used) for the duration of the COVID-19 declaration under Section 564(b)(1) of the Act, 21 U.S.C. section 360bbb-3(b)(1), unless the authorization is terminated or revoked.  Performed at [facility], [address], [place], [place] [address]   Urine Culture     Status: Abnormal   Collection Time: 10/03/22 10:08 PM   Specimen: Urine, Clean Catch  Result Value Ref Range Status   Specimen Description   Final    URINE, CLEAN CATCH Performed at [facility], [address] [place], [place] [address]    Special Requests   Final    NONE Performed at [facility], [address] [place], [place] [address]    Culture >=100,000 COLONIES/mL ESCHERICHIA COLI (A)  Final   Report Status 10/06/2022 FINAL  Final   Organism ID, Bacteria ESCHERICHIA COLI (A)  Final      Susceptibility   Escherichia coli - MIC*    AMPICILLIN <=2 SENSITIVE Sensitive     CEFAZOLIN <=4 SENSITIVE Sensitive     CEFEPIME <=0.12 SENSITIVE Sensitive     CEFTRIAXONE <=0.25 SENSITIVE Sensitive     CIPROFLOXACIN <=0.25 SENSITIVE Sensitive     GENTAMICIN <=  1 SENSITIVE Sensitive     IMIPENEM <=0.25 SENSITIVE Sensitive     NITROFURANTOIN <=16 SENSITIVE Sensitive     TRIMETH/SULFA <=20 SENSITIVE Sensitive     AMPICILLIN/SULBACTAM <=2 SENSITIVE Sensitive     PIP/TAZO <=4 SENSITIVE Sensitive     * >=100,000  COLONIES/mL ESCHERICHIA COLI    RADIOLOGY STUDIES/RESULTS: CT ABDOMEN W CONTRAST  Result Date: 10/09/2022 CLINICAL DATA:  Left upper quadrant pain.  Pyelonephritis suspected. EXAM: CT ABDOMEN WITH CONTRAST TECHNIQUE: Multidetector CT imaging of the abdomen was performed using the standard protocol following bolus administration of intravenous contrast. RADIATION DOSE REDUCTION: This exam was performed according to the departmental dose-optimization program which includes automated exposure control, adjustment of the mA and/or kV according to patient size and/or use of iterative reconstruction technique. CONTRAST:  45m OMNIPAQUE IOHEXOL 350 MG/ML SOLN COMPARISON:  10/03/2022 FINDINGS: Lower chest: Nodular consolidative opacity in the left lower lobe is new in the interval. Likely atelectatic, pneumonia is not excluded. Given the rapid interval appearance, neoplasm is considered unlikely. Hepatobiliary: No suspicious focal abnormality within the liver parenchyma. Gallbladder is decompressed. No intrahepatic or extrahepatic biliary dilation. Pancreas: No focal mass lesion. No dilatation of the main duct. No intraparenchymal cyst. No peripancreatic edema. Spleen: No splenomegaly. No focal mass lesion. Adrenals/Urinary Tract: No adrenal nodule or mass. Both kidneys are atrophic with multiple cystic lesions bilaterally, similar to prior. 9 mm lesion interpolar right kidney on 30/3 has attenuation too high to be a simple cyst. This was not definitely seen on MRI 08/06/2016. 11 mm interpolar left renal lesion also with attenuation too high to be a simple cyst. This had imaging features compatible with a cyst on MRI 08/06/2016. Stomach/Bowel: Stomach is distended with food material. Duodenum is normally positioned as is the ligament of Treitz. Insert abdominal bowel Vascular/Lymphatic: There is mild atherosclerotic calcification of the abdominal aorta without aneurysm. There is no gastrohepatic or hepatoduodenal  ligament lymphadenopathy. No retroperitoneal or mesenteric lymphadenopathy. Other: No intraperitoneal free fluid. Musculoskeletal: No worrisome lytic or sclerotic osseous abnormality. Gas in the subcutaneous fat of the left anterior abdominal wall is likely from injection. IMPRESSION: 1. No acute findings in the abdomen. Specifically, no findings to explain the patient's history of left upper quadrant pain. 2. Nodular consolidative opacity in the left lower lobe is new in the interval. Likely atelectatic, pneumonia is not excluded. Given the rapid interval appearance, neoplasm is considered unlikely. 3. 9 mm lesion interpolar right kidney has attenuation too high to be a simple cyst. This was not definitely seen on MRI 08/06/2016. While this may be a cyst complicated by proteinaceous debris or hemorrhage, neoplasm is not entirely excluded. Follow-up abdominal MRI with and without contrast in 3 months recommended to ensure stability. 4. Bilateral renal atrophy. 5.  Aortic Atherosclerosis (ICD10-I70.0). Electronically Signed   By: EMisty StanleyM.D.   On: 10/09/2022 14:44   IR Fluoro Guide CV Line Right  Result Date: 10/08/2022 INDICATION: 52year old with end-stage renal disease. Poorly functioning tunneled dialysis catheter. Patient presents for catheter exchange. EXAM: FLUOROSCOPIC AND ULTRASOUND GUIDED PLACEMENT OF A TUNNELED DIALYSIS CATHETER Physician: AStephan Minister Henn, MD MEDICATIONS: Ancef 2 g ANESTHESIA/SEDATION: 1% lidocaine for local anesthetic FLUOROSCOPY: Radiation Exposure Index (as provided by the fluoroscopic device): 8 mGy Kerma COMPLICATIONS: None immediate. PROCEDURE: The procedure was explained to the patient. The risks and benefits of the procedure were discussed and the patient's questions were addressed. Informed consent was obtained from the patient. Patient was placed supine on the interventional table. The  right chest and existing catheter were prepped and draped in sterile fashion. Maximal  barrier sterile technique was utilized including caps, mask, sterile gowns, sterile gloves, sterile drape, hand hygiene and skin antiseptic. Fluoroscopy demonstrated that the existing catheter was well within the right atrium. The catheter cuff was easily exposed with mild traction. Skin around the catheter exit site was anesthetized using 1% lidocaine. Catheter was pulled back to the superior cavoatrial junction and both lumens aspirated and flushed well. However, the catheter cuff was exposed at this location. The existing catheter was removed over a stiff Glidewire and a new 19 cm Palindrome dialysis catheter was easily advanced over the wire. The cuff was placed underneath the skin. Catheter tip at the superior cavoatrial junction. Both lumens aspirated and flushed well. 1.6 mL heparin (1000 units/ml) was placed in both lumens. Catheter was secured to skin with suture. Both lumens were capped and clamped at the end of the procedure. Dressing was placed. Fluoroscopic images were taken and saved for this procedure. FINDINGS: New 19 cm tip to cuff Palindrome catheter was placed. Catheter tip at the superior cavoatrial junction. IMPRESSION: Successful exchange of the tunneled dialysis catheter with fluoroscopy. Electronically Signed   By: Markus Daft M.D.   On: 10/08/2022 11:26     LOS: 3 days   Oren Binet, MD  [facility]    To contact the attending provider between 7A-7P or the covering provider during after hours 7P-7A, please log into the web site [email or web address] and access using universal [place] password for that web site. If you do not have the password, please call the hospital operator.  10/10/2022, 9:09 AM

## 2022-10-10 NOTE — Progress Notes (Signed)
[facility] Progress Note   Subjective:   Patient seen and examined in room.  Standing up at sink upon entering.  Reports feeling better today.  Admits to cough and wheezing but believes she is breathing better.  Denies CP, abdominal pain and n/v/d.  Objective Vitals:   10/09/22 1724 10/09/22 2051 10/10/22 0527 10/10/22 0848  BP: 114/65 110/77 (!) 91/57 126/77  Pulse: 93 87 81 87  Resp: '18 18 18   '$ Temp: 99 F (37.2 C) 98.4 F (36.9 C) 98.3 F (36.8 C) 98.4 F (36.9 C)  TempSrc: Oral Oral Oral Oral  SpO2: 100% 99% 97% 99%  Weight:      Height:       Physical Exam General:well appearing female in NAD Heart:RRR, no mrg Lungs:scattered wheezing, decreased in LLL  Abdomen:soft, NTND Extremities:no LE edema Dialysis Access: [facility]   Filed Weights   10/08/22 0700 10/08/22 2032 10/09/22 0432  Weight: 80.8 kg 80.8 kg 79.4 kg    Intake/Output Summary (Last 24 hours) at 10/10/2022 1202 Last data filed at 10/10/2022 0743 Gross per 24 hour  Intake 720 ml  Output 0 ml  Net 720 ml    Additional Objective Labs: Basic Metabolic Panel: Recent Labs  Lab 10/07/22 0755 10/08/22 0516 10/10/22 0734  NA 136 134* 133*  K 5.0 5.3* 4.9  CL 93* 95* 96*  CO2 '25 23 25  '$ GLUCOSE 103* 84 107*  BUN 48* 45* 30*  CREATININE 11.94* 10.84* 7.88*  CALCIUM 7.8* 7.3* 7.9*  PHOS 6.7* 5.5* 4.9*   Liver Function Tests: Recent Labs  Lab 10/03/22 1805 10/06/22 0131 10/07/22 0755 10/08/22 0516 10/10/22 0734  AST 15 26  --   --   --   ALT 12 18  --   --   --   ALKPHOS 95 100  --   --   --   BILITOT 0.5 0.5  --   --   --   PROT 7.7 8.1  --   --   --   ALBUMIN 3.6 3.5 3.0* 2.8* 2.9*   No results for input(s): "LIPASE", "AMYLASE" in the last 168 hours. CBC: Recent Labs  Lab 10/03/22 1805 10/06/22 0131 10/06/22 1856 10/07/22 0755 10/08/22 0516 10/10/22 0734  WBC 18.0* 20.4* 16.0* 16.7* 13.0* 15.0*  NEUTROABS 12.0* 14.8*  --   --   --   --   HGB 8.8* 8.3* 8.2* 7.8* 7.0*  7.2*  HCT 25.2* 23.9* 23.0* 21.3* 19.7* 20.6*  MCV 105.9* 106.2* 105.0* 102.9* 104.8* 103.5*  PLT 388 375 344 335 284 353   Blood Culture    Component Value Date/Time   SDES  10/03/2022 2208    URINE, CLEAN CATCH Performed at [facility], [address], [place], [place] [address]    [facility]  10/03/2022 2208    NONE Performed at [facility], [address], [place], [place] [address]    CULT >=100,000 COLONIES/mL ESCHERICHIA COLI (A) 10/03/2022 2208   REPTSTATUS 10/06/2022 FINAL 10/03/2022 2208    CBG: Recent Labs  Lab 10/09/22 1606 10/09/22 2055 10/10/22 0531 10/10/22 0723 10/10/22 1108  GLUCAP 84 105* 89 88 93    Studies/Results: CT ABDOMEN W CONTRAST  Result Date: 10/09/2022 CLINICAL DATA:  Left upper quadrant pain.  Pyelonephritis suspected. EXAM: CT ABDOMEN WITH CONTRAST TECHNIQUE: Multidetector CT imaging of the abdomen was performed using the standard protocol following bolus administration of intravenous contrast. RADIATION DOSE REDUCTION: This exam was performed according  to the departmental dose-optimization program which includes automated exposure control, adjustment of the mA and/or kV according to patient size and/or use of iterative reconstruction technique. CONTRAST:  45m OMNIPAQUE IOHEXOL 350 MG/ML SOLN COMPARISON:  10/03/2022 FINDINGS: Lower chest: Nodular consolidative opacity in the left lower lobe is new in the interval. Likely atelectatic, pneumonia is not excluded. Given the rapid interval appearance, neoplasm is considered unlikely. Hepatobiliary: No suspicious focal abnormality within the liver parenchyma. Gallbladder is decompressed. No intrahepatic or extrahepatic biliary dilation. Pancreas: No focal mass lesion. No dilatation of the main duct. No intraparenchymal cyst. No peripancreatic edema. Spleen: No splenomegaly. No focal mass lesion. Adrenals/Urinary Tract: No adrenal nodule or mass. Both kidneys are atrophic with  multiple cystic lesions bilaterally, similar to prior. 9 mm lesion interpolar right kidney on 30/3 has attenuation too high to be a simple cyst. This was not definitely seen on MRI 08/06/2016. 11 mm interpolar left renal lesion also with attenuation too high to be a simple cyst. This had imaging features compatible with a cyst on MRI 08/06/2016. Stomach/Bowel: Stomach is distended with food material. Duodenum is normally positioned as is the ligament of Treitz. Insert abdominal bowel Vascular/Lymphatic: There is mild atherosclerotic calcification of the abdominal aorta without aneurysm. There is no gastrohepatic or hepatoduodenal ligament lymphadenopathy. No retroperitoneal or mesenteric lymphadenopathy. Other: No intraperitoneal free fluid. Musculoskeletal: No worrisome lytic or sclerotic osseous abnormality. Gas in the subcutaneous fat of the left anterior abdominal wall is likely from injection. IMPRESSION: 1. No acute findings in the abdomen. Specifically, no findings to explain the patient's history of left upper quadrant pain. 2. Nodular consolidative opacity in the left lower lobe is new in the interval. Likely atelectatic, pneumonia is not excluded. Given the rapid interval appearance, neoplasm is considered unlikely. 3. 9 mm lesion interpolar right kidney has attenuation too high to be a simple cyst. This was not definitely seen on MRI 08/06/2016. While this may be a cyst complicated by proteinaceous debris or hemorrhage, neoplasm is not entirely excluded. Follow-up abdominal MRI with and without contrast in 3 months recommended to ensure stability. 4. Bilateral renal atrophy. 5.  Aortic Atherosclerosis (ICD10-I70.0). Electronically Signed   By: EMisty StanleyM.D.   On: 10/09/2022 14:44    Medications:  sodium chloride     cefTRIAXone (ROCEPHIN)  IV 2 g (10/09/22 2106)    atorvastatin  40 mg Oral Daily   azithromycin  500 mg Oral Daily   carvedilol  6.25 mg Oral BID WC   Chlorhexidine Gluconate  Cloth  6 each Topical Q0600   cholecalciferol  2,000 Units Oral Daily   cinacalcet  30 mg Oral Daily   heparin  5,000 Units Subcutaneous Q8H   hydroxyurea  500 mg Oral Daily   insulin aspart  0-9 Units Subcutaneous TID WC   ipratropium-albuterol  3 mL Nebulization TID   multivitamin  1 tablet Oral Daily   patiromer  8.4 g Oral Once per day on Sun Tue   sevelamer carbonate  1,600 mg Oral TID WC   sodium chloride flush  3 mL Intravenous Q12H    Dialysis Orders: [facility] MWF 3hr 459m RIJ TC, failed LUA loop 180NRe 400/500 EDW 74kg (left at 74.1 on 1/15) 1/2/2.5 UFR profile 2 Mircera 150 q2weeks (last given 1/10) Hectorol 15m70m  Assessment/Plan: Left sided pyelonephritis treated with Rocephin; positive for E.Coli on urine culture. On ABX. Cough/developing PNA/atelectasis - CT angio noted no PE or consolidation. Repeat CT on 1/20 w/consolidation likely atelectasis or  possible PNA.   Remains over dry weight, check standing weight tomorrow.  ESRD on MWF . Off schedule last week due to non functioning TDC.  Had [facility] exchange 1/19. Resume regular schedule tomorrow.  HTN/Vol - BP soft, nifedipine on hold, coreg decreased to 6.'25mg'$  BID - BP remain low, will decrease to 3.'125mg'$  BID.  Per weights not close to EDW, get standing weight tomorrow.  Max UF as tolerated.  BMM- CCa ok, phos at goal.  Continue Renvela 1-2 TIDM Anemia of CKD - Hgb 7.2. ESA due 1/24. does not appear to be in a sickle crisis at this time although she does sometimes have crisis with severe cold weather. Sickle Cell Disease - Does not appear to be in crisis.  Nutrition - Renal diet w/fluid restrictions. Lesion on R kidney - noted on CT. 9 mm lesion interpolar right kidney has attenuation too high to be a simple cyst. This was not definitely seen on MRI 08/06/2016.  Could be complex cyst but unable to r/o neoplasm.  F/u MRI in 3 months to ensure stability.   Jen Mow, PA-C [facility] 10/10/2022,12:02 PM   LOS: 3 days

## 2022-10-11 DIAGNOSIS — N186 End stage renal disease: Secondary | ICD-10-CM | POA: Diagnosis not present

## 2022-10-11 DIAGNOSIS — R52 Pain, unspecified: Secondary | ICD-10-CM | POA: Diagnosis not present

## 2022-10-11 DIAGNOSIS — N12 Tubulo-interstitial nephritis, not specified as acute or chronic: Secondary | ICD-10-CM | POA: Diagnosis not present

## 2022-10-11 DIAGNOSIS — D631 Anemia in chronic kidney disease: Secondary | ICD-10-CM | POA: Diagnosis not present

## 2022-10-11 LAB — CBC
HCT: 18.9 % — ABNORMAL LOW (ref 36.0–46.0)
Hemoglobin: 6.6 g/dL — CL (ref 12.0–15.0)
MCH: 35.9 pg — ABNORMAL HIGH (ref 26.0–34.0)
MCHC: 34.9 g/dL (ref 30.0–36.0)
MCV: 102.7 fL — ABNORMAL HIGH (ref 80.0–100.0)
Platelets: 387 10*3/uL (ref 150–400)
RBC: 1.84 MIL/uL — ABNORMAL LOW (ref 3.87–5.11)
RDW: 19.8 % — ABNORMAL HIGH (ref 11.5–15.5)
WBC: 12.9 10*3/uL — ABNORMAL HIGH (ref 4.0–10.5)
nRBC: 2.9 % — ABNORMAL HIGH (ref 0.0–0.2)

## 2022-10-11 LAB — GLUCOSE, CAPILLARY: Glucose-Capillary: 163 mg/dL — ABNORMAL HIGH (ref 70–99)

## 2022-10-11 LAB — RENAL FUNCTION PANEL
Albumin: 2.8 g/dL — ABNORMAL LOW (ref 3.5–5.0)
Anion gap: 14 (ref 5–15)
BUN: 41 mg/dL — ABNORMAL HIGH (ref 6–20)
CO2: 25 mmol/L (ref 22–32)
Calcium: 7.9 mg/dL — ABNORMAL LOW (ref 8.9–10.3)
Chloride: 94 mmol/L — ABNORMAL LOW (ref 98–111)
Creatinine, Ser: 9.98 mg/dL — ABNORMAL HIGH (ref 0.44–1.00)
GFR, Estimated: 4 mL/min — ABNORMAL LOW (ref >60)
Glucose, Bld: 111 mg/dL — ABNORMAL HIGH (ref 70–99)
Phosphorus: 5.4 mg/dL — ABNORMAL HIGH (ref 2.5–4.6)
Potassium: 4.3 mmol/L (ref 3.5–5.1)
Sodium: 133 mmol/L — ABNORMAL LOW (ref 135–145)

## 2022-10-11 LAB — PREPARE RBC (CROSSMATCH)

## 2022-10-11 MED ORDER — PENTAFLUOROPROP-TETRAFLUOROETH EX AERO: 1.0000 | INHALATION_SPRAY | CUTANEOUS | Status: DC | PRN

## 2022-10-11 MED ORDER — SODIUM CHLORIDE 0.9% IV SOLUTION: Freq: Once | INTRAVENOUS | Status: DC

## 2022-10-11 MED ORDER — ALTEPLASE 2 MG IJ SOLR: 2.0000 mg | Freq: Once | INTRAMUSCULAR | Status: DC | PRN

## 2022-10-11 MED ORDER — ALBUMIN HUMAN 25 % IV SOLN
25.0000 g | Freq: Once | INTRAVENOUS | Status: DC
  Filled 2022-10-11 (×2): qty 100

## 2022-10-11 MED ORDER — LIDOCAINE-PRILOCAINE 2.5-2.5 % EX CREA: 1.0000 | TOPICAL_CREAM | CUTANEOUS | Status: DC | PRN

## 2022-10-11 MED ORDER — BENZONATATE 100 MG PO CAPS
200.0000 mg | ORAL_CAPSULE | Freq: Three times a day (TID) | ORAL | Status: DC | PRN
  Administered 2022-10-11 – 2022-10-13 (×6): 200 mg via ORAL
  Filled 2022-10-11 (×6): qty 2

## 2022-10-11 MED ORDER — ANTICOAGULANT SODIUM CITRATE 4% (200MG/5ML) IV SOLN: 5.0000 mL | Status: DC | PRN

## 2022-10-11 MED ORDER — LIDOCAINE HCL (PF) 1 % IJ SOLN: 5.0000 mL | INTRAMUSCULAR | Status: DC | PRN

## 2022-10-11 MED ORDER — HEPARIN SODIUM (PORCINE) 1000 UNIT/ML DIALYSIS
1000.0000 [IU] | INTRAMUSCULAR | Status: DC | PRN
  Administered 2022-10-11: 1000 [IU]
  Filled 2022-10-11 (×2): qty 1

## 2022-10-11 NOTE — Progress Notes (Signed)
PROGRESS NOTE        PATIENT DETAILS Name: Julie Wells Age: 52 y.o. Sex: female Date of Birth: June 08, 1971 Admit Date: 10/06/2022 Admitting Physician Vianne Bulls, MD CHY:IFOYD, Merlyn Albert, MD  Brief Summary: Patient is a 52 y.o.  female with history of ESRD on HD MWF, HTN, DM-2, sickle cell disease, HLD-presented with left flank pain (second ED visit)-thought to have pyelonephritis and subsequently admitted to the hospitalist service.  Significant events: 1/14>> presented to [facility] with left flank pain-manage medically-discharge home. 1/17>> presented to [facility]-worsening left flank pain-urine culture on 1/14 positive for E. coli-admit to [facility].    Significant studies: 1/14>> CT abdomen/pelvis: 3.8 cm left and 3.7 cm right ovarian cyst.  Bilateral atrophic kidneys. 1/17>> CT angio chest: No PE, bibasilar atelectatic changes. 1/20>> CT abdomen/pelvis: No acute findings in the abdomen.  Nodular consolidative opacity left lower lobe-likely atelectatic-PNA not excluded.  Significant microbiology data: 1/14>> urine culture: E. coli  Procedures: None  Consults: Nephrology  Subjective: Continues to have some pain in left flank area.  Cough continues.  Feels overall better.  Objective: Vitals: Blood pressure 113/76, pulse 74, temperature 98.1 F (36.7 C), resp. rate 18, height '5\' 2"'$  (1.575 m), weight 79.5 kg, last menstrual period 09/14/2022, SpO2 100 %.   Exam: Gen Exam:Alert awake-not in any distress HEENT:atraumatic, normocephalic Chest: Moving air well-only a few scattered rhonchi today. CVS:S1S2 regular Abdomen:soft non tender, non distended Extremities:no edema Neurology: Non focal Skin: no rash  Pertinent Labs/Radiology:    Latest Ref Rng & Units 10/11/2022    8:47 AM 10/10/2022    7:34 AM 10/08/2022    5:16 AM  CBC  WBC 4.0 - 10.5 K/uL 12.9  15.0  13.0   Hemoglobin 12.0 - 15.0 g/dL 6.6  7.2  7.0   Hematocrit 36.0 - 46.0 % 18.9  20.6  19.7    Platelets 150 - 400 K/uL 387  353  284     Lab Results  Component Value Date   NA 133 (L) 10/10/2022   K 4.9 10/10/2022   CL 96 (L) 10/10/2022   CO2 25 10/10/2022      Assessment/Plan: Severe left flank pain-likely due to pyelonephritis involving left kidney-and now due to developing PNA at left lung base  Overall improved Although continues to have left flank pain-significantly less intense than on initial presentation Continue Rocephin/Zithromax Continue mobilization Continue bronchodilators  Repeat CT abdomen on 1/20-no major abnormality seen.   ESRD on HD MWF Nephrology following for HD needs  Malfunctioning tunneled HD catheter Catheter exchanged by IR on 1/19-underwent HD earlier this morning.  Hyperkalemia Resolved with HD   Normocytic anemia Due to combination of ESRD and sickle cell disease Significant drop in hemoglobin overnight-nephrology has ordered 1 unit of PRBC. Follow posttransfusion CBC.   HTN BP still soft Nifedipine on hold Cautiously continue with reduced dose Coreg Continue to reassess and readjust medications.   HLD Continue statin   Sickle cell disease Does not appear to be in crisis-as this current clinical presentation is not very typical of her usual sickle cell crisis pain Will monitor for now   Bilateral ovarian cyst Seen incidentally on CT abdomen on 1/14 Likely not the cause for her left flank pain Outpatient follow-up with PCP/GYN  9 mm right kidney lesion Radiology recommending repeat MRI in 3 months to ensure stability.  Obesity: Estimated body mass index is 32.06 kg/m as calculated from the following:   Height as of this encounter: '5\' 2"'$  (1.575 m).   Weight as of this encounter: 79.5 kg.   Code status:   Code Status: Full Code   DVT Prophylaxis: heparin injection 5,000 Units Start: 10/06/22 2200   Family Communication: Spouse at bedside   Disposition Plan: Status is: Observation The patient will require care  spanning > 2 midnights and should be moved to inpatient because: Severity of illness   Planned Discharge Destination:Home hopefully in the next 1-2 days.   Diet: Diet Order             Diet renal/carb modified with fluid restriction Diet-HS Snack? Nothing; Fluid restriction: 1200 mL Fluid; Room service appropriate? Yes; Fluid consistency: Thin  Diet effective now                     Antimicrobial agents: Anti-infectives (From admission, onward)    Start     Dose/Rate Route Frequency Ordered Stop   10/10/22 1000  azithromycin (ZITHROMAX) tablet 500 mg        500 mg Oral Daily 10/10/22 0735 10/13/22 0959   10/08/22 1145  ceFAZolin (ANCEF) IVPB 2g/100 mL premix        2 g 200 mL/hr over 30 Minutes Intravenous  Once 10/08/22 1059 10/08/22 1138   10/08/22 1101  ceFAZolin (ANCEF) 2-4 GM/100ML-% IVPB       Note to Pharmacy: Trixie Dredge C: cabinet override      10/08/22 1101 10/08/22 2314   10/06/22 2200  cefTRIAXone (ROCEPHIN) 2 g in sodium chloride 0.9 % 100 mL IVPB        2 g 200 mL/hr over 30 Minutes Intravenous Every 24 hours 10/06/22 1732     10/06/22 0230  cefTRIAXone (ROCEPHIN) 1 g in sodium chloride 0.9 % 100 mL IVPB        1 g 200 mL/hr over 30 Minutes Intravenous  Once 10/06/22 0220 10/06/22 0326        MEDICATIONS: Scheduled Meds:  sodium chloride   Intravenous Once   atorvastatin  40 mg Oral Daily   azithromycin  500 mg Oral Daily   carvedilol  3.125 mg Oral BID WC   Chlorhexidine Gluconate Cloth  6 each Topical Q0600   cholecalciferol  2,000 Units Oral Daily   cinacalcet  30 mg Oral Daily   heparin  5,000 Units Subcutaneous Q8H   hydroxyurea  500 mg Oral Daily   insulin aspart  0-9 Units Subcutaneous TID WC   ipratropium-albuterol  3 mL Nebulization TID   multivitamin  1 tablet Oral Daily   patiromer  8.4 g Oral Once per day on Sun Tue   sevelamer carbonate  1,600 mg Oral TID WC   sodium chloride flush  3 mL Intravenous Q12H   Continuous  Infusions:  sodium chloride     albumin human     anticoagulant sodium citrate     cefTRIAXone (ROCEPHIN)  IV 2 g (10/10/22 2200)   PRN Meds:.sodium chloride, acetaminophen **OR** acetaminophen, albuterol, alteplase, anticoagulant sodium citrate, cyclobenzaprine, guaiFENesin-dextromethorphan, heparin, hydrALAZINE, HYDROmorphone (DILAUDID) injection, lidocaine (PF), lidocaine-prilocaine, ondansetron **OR** ondansetron (ZOFRAN) IV, mouth rinse, oxyCODONE, pentafluoroprop-tetrafluoroeth, polyethylene glycol, sodium chloride flush   I have personally reviewed following labs and imaging studies  LABORATORY DATA: CBC: Recent Labs  Lab 10/06/22 0131 10/06/22 1856 10/07/22 0755 10/08/22 0516 10/10/22 0734 10/11/22 0847  WBC 20.4* 16.0* 16.7* 13.0* 15.0* 12.9*  NEUTROABS 14.8*  --   --   --   --   --   HGB 8.3* 8.2* 7.8* 7.0* 7.2* 6.6*  HCT 23.9* 23.0* 21.3* 19.7* 20.6* 18.9*  MCV 106.2* 105.0* 102.9* 104.8* 103.5* 102.7*  PLT 375 344 335 284 353 387     Basic Metabolic Panel: Recent Labs  Lab 10/06/22 0131 10/06/22 1856 10/07/22 0755 10/08/22 0516 10/10/22 0734  NA 136  --  136 134* 133*  K 4.8  --  5.0 5.3* 4.9  CL 95*  --  93* 95* 96*  CO2 27  --  '25 23 25  '$ GLUCOSE 108*  --  103* 84 107*  BUN 40*  --  48* 45* 30*  CREATININE 9.76* 11.04* 11.94* 10.84* 7.88*  CALCIUM 7.7*  --  7.8* 7.3* 7.9*  PHOS  --   --  6.7* 5.5* 4.9*     GFR: Estimated Creatinine Clearance: 8.1 mL/min (A) (by C-G formula based on SCr of 7.88 mg/dL (H)).  Liver Function Tests: Recent Labs  Lab 10/06/22 0131 10/07/22 0755 10/08/22 0516 10/10/22 0734  AST 26  --   --   --   ALT 18  --   --   --   ALKPHOS 100  --   --   --   BILITOT 0.5  --   --   --   PROT 8.1  --   --   --   ALBUMIN 3.5 3.0* 2.8* 2.9*    No results for input(s): "LIPASE", "AMYLASE" in the last 168 hours. No results for input(s): "AMMONIA" in the last 168 hours.  Coagulation Profile: No results for input(s): "INR",  "PROTIME" in the last 168 hours.  Cardiac Enzymes: No results for input(s): "CKTOTAL", "CKMB", "CKMBINDEX", "TROPONINI" in the last 168 hours.  BNP (last 3 results) No results for input(s): "PROBNP" in the last 8760 hours.  Lipid Profile: No results for input(s): "CHOL", "HDL", "LDLCALC", "TRIG", "CHOLHDL", "LDLDIRECT" in the last 72 hours.  Thyroid Function Tests: No results for input(s): "TSH", "T4TOTAL", "FREET4", "T3FREE", "THYROIDAB" in the last 72 hours.  Anemia Panel: No results for input(s): "VITAMINB12", "FOLATE", "FERRITIN", "TIBC", "IRON", "RETICCTPCT" in the last 72 hours.  Urine analysis:    Component Value Date/Time   COLORURINE YELLOW 10/06/2022 1317   APPEARANCEUR HAZY (A) 10/06/2022 1317   LABSPEC 1.015 10/06/2022 1317   PHURINE 8.5 (H) 10/06/2022 1317   GLUCOSEU NEGATIVE 10/06/2022 1317   HGBUR SMALL (A) 10/06/2022 1317   BILIRUBINUR NEGATIVE 10/06/2022 1317   KETONESUR NEGATIVE 10/06/2022 1317   PROTEINUR 100 (A) 10/06/2022 1317   UROBILINOGEN 0.2 02/13/2015 1823   NITRITE NEGATIVE 10/06/2022 1317   LEUKOCYTESUR SMALL (A) 10/06/2022 1317    Sepsis Labs: Lactic Acid, Venous No results found for: "LATICACIDVEN"  MICROBIOLOGY: Recent Results (from the past 240 hour(s))  Resp panel by RT-PCR (RSV, Flu A&B, Covid) Nasopharyngeal Swab     Status: None   Collection Time: 10/03/22  3:38 PM   Specimen: Nasopharyngeal Swab; Nasal Swab  Result Value Ref Range Status   SARS Coronavirus 2 by RT PCR NEGATIVE NEGATIVE Final    Comment: (NOTE) SARS-CoV-2 target nucleic acids are NOT DETECTED.  The SARS-CoV-2 RNA is generally detectable in upper respiratory specimens during the acute phase of infection. The lowest concentration of SARS-CoV-2 viral copies this assay can detect is 138 copies/mL. A negative result does not preclude SARS-Cov-2 infection and should not be used as the sole basis for treatment or other patient  management decisions. A negative result  may occur with  improper specimen collection/handling, submission of specimen other than nasopharyngeal swab, presence of viral mutation(s) within the areas targeted by this assay, and inadequate number of viral copies(<138 copies/mL). A negative result must be combined with clinical observations, patient history, and epidemiological information. The expected result is Negative.  Fact Sheet for Patients:  EntrepreneurPulse.com.au  Fact Sheet for Healthcare Providers:  IncredibleEmployment.be  This test is no t yet approved or cleared by the [place] FDA and  has been authorized for detection and/or diagnosis of SARS-CoV-2 by FDA under an Emergency Use Authorization (EUA). This EUA will remain  in effect (meaning this test can be used) for the duration of the COVID-19 declaration under Section 564(b)(1) of the Act, 21 U.S.C.section 360bbb-3(b)(1), unless the authorization is terminated  or revoked sooner.       Influenza A by PCR NEGATIVE NEGATIVE Final   Influenza B by PCR NEGATIVE NEGATIVE Final    Comment: (NOTE) The Xpert Xpress SARS-CoV-2/FLU/RSV plus assay is intended as an aid in the diagnosis of influenza from Nasopharyngeal swab specimens and should not be used as a sole basis for treatment. Nasal washings and aspirates are unacceptable for Xpert Xpress SARS-CoV-2/FLU/RSV testing.  Fact Sheet for Patients: EntrepreneurPulse.com.au  Fact Sheet for Healthcare Providers: IncredibleEmployment.be  This test is not yet approved or cleared by the [place] FDA and has been authorized for detection and/or diagnosis of SARS-CoV-2 by FDA under an Emergency Use Authorization (EUA). This EUA will remain in effect (meaning this test can be used) for the duration of the COVID-19 declaration under Section 564(b)(1) of the Act, 21 U.S.C. section 360bbb-3(b)(1), unless the authorization is terminated  or revoked.     Resp Syncytial Virus by PCR NEGATIVE NEGATIVE Final    Comment: (NOTE) Fact Sheet for Patients: EntrepreneurPulse.com.au  Fact Sheet for Healthcare Providers: IncredibleEmployment.be  This test is not yet approved or cleared by the [place] FDA and has been authorized for detection and/or diagnosis of SARS-CoV-2 by FDA under an Emergency Use Authorization (EUA). This EUA will remain in effect (meaning this test can be used) for the duration of the COVID-19 declaration under Section 564(b)(1) of the Act, 21 U.S.C. section 360bbb-3(b)(1), unless the authorization is terminated or revoked.  Performed at [facility], [address] [place], [place] [address]   Urine Culture     Status: Abnormal   Collection Time: 10/03/22 10:08 PM   Specimen: Urine, Clean Catch  Result Value Ref Range Status   Specimen Description   Final    URINE, CLEAN CATCH Performed at [facility], [address] [place], [place] [address]    Special Requests   Final    NONE Performed at [facility], [address] [place], [place] [address]    Culture >=100,000 COLONIES/mL ESCHERICHIA COLI (A)  Final   Report Status 10/06/2022 FINAL  Final   Organism ID, Bacteria ESCHERICHIA COLI (A)  Final      Susceptibility   Escherichia coli - MIC*    AMPICILLIN <=2 SENSITIVE Sensitive     CEFAZOLIN <=4 SENSITIVE Sensitive     CEFEPIME <=0.12 SENSITIVE Sensitive     CEFTRIAXONE <=0.25 SENSITIVE Sensitive     CIPROFLOXACIN <=0.25 SENSITIVE Sensitive     GENTAMICIN <=1 SENSITIVE Sensitive     IMIPENEM <=0.25 SENSITIVE Sensitive     NITROFURANTOIN <=16 SENSITIVE Sensitive     TRIMETH/SULFA <=20 SENSITIVE  Sensitive     AMPICILLIN/SULBACTAM <=2 SENSITIVE Sensitive     PIP/TAZO <=4 SENSITIVE Sensitive     * >=100,000 COLONIES/mL ESCHERICHIA COLI    RADIOLOGY STUDIES/RESULTS: CT ABDOMEN W CONTRAST  Result Date:  10/09/2022 CLINICAL DATA:  Left upper quadrant pain.  Pyelonephritis suspected. EXAM: CT ABDOMEN WITH CONTRAST TECHNIQUE: Multidetector CT imaging of the abdomen was performed using the standard protocol following bolus administration of intravenous contrast. RADIATION DOSE REDUCTION: This exam was performed according to the departmental dose-optimization program which includes automated exposure control, adjustment of the mA and/or kV according to patient size and/or use of iterative reconstruction technique. CONTRAST:  82m OMNIPAQUE IOHEXOL 350 MG/ML SOLN COMPARISON:  10/03/2022 FINDINGS: Lower chest: Nodular consolidative opacity in the left lower lobe is new in the interval. Likely atelectatic, pneumonia is not excluded. Given the rapid interval appearance, neoplasm is considered unlikely. Hepatobiliary: No suspicious focal abnormality within the liver parenchyma. Gallbladder is decompressed. No intrahepatic or extrahepatic biliary dilation. Pancreas: No focal mass lesion. No dilatation of the main duct. No intraparenchymal cyst. No peripancreatic edema. Spleen: No splenomegaly. No focal mass lesion. Adrenals/Urinary Tract: No adrenal nodule or mass. Both kidneys are atrophic with multiple cystic lesions bilaterally, similar to prior. 9 mm lesion interpolar right kidney on 30/3 has attenuation too high to be a simple cyst. This was not definitely seen on MRI 08/06/2016. 11 mm interpolar left renal lesion also with attenuation too high to be a simple cyst. This had imaging features compatible with a cyst on MRI 08/06/2016. Stomach/Bowel: Stomach is distended with food material. Duodenum is normally positioned as is the ligament of Treitz. Insert abdominal bowel Vascular/Lymphatic: There is mild atherosclerotic calcification of the abdominal aorta without aneurysm. There is no gastrohepatic or hepatoduodenal ligament lymphadenopathy. No retroperitoneal or mesenteric lymphadenopathy. Other: No intraperitoneal free  fluid. Musculoskeletal: No worrisome lytic or sclerotic osseous abnormality. Gas in the subcutaneous fat of the left anterior abdominal wall is likely from injection. IMPRESSION: 1. No acute findings in the abdomen. Specifically, no findings to explain the patient's history of left upper quadrant pain. 2. Nodular consolidative opacity in the left lower lobe is new in the interval. Likely atelectatic, pneumonia is not excluded. Given the rapid interval appearance, neoplasm is considered unlikely. 3. 9 mm lesion interpolar right kidney has attenuation too high to be a simple cyst. This was not definitely seen on MRI 08/06/2016. While this may be a cyst complicated by proteinaceous debris or hemorrhage, neoplasm is not entirely excluded. Follow-up abdominal MRI with and without contrast in 3 months recommended to ensure stability. 4. Bilateral renal atrophy. 5.  Aortic Atherosclerosis (ICD10-I70.0). Electronically Signed   By: EMisty StanleyM.D.   On: 10/09/2022 14:44     LOS: 4 days   SOren Binet MD  [facility]    To contact the attending provider between 7A-7P or the covering provider during after hours 7P-7A, please log into the web site [email or web address] and access using universal [place] password for that web site. If you do not have the password, please call the hospital operator.  10/11/2022, 9:43 AM

## 2022-10-11 NOTE — Progress Notes (Signed)
Date and time results received: 10/11/22 09:23  (use smartphrase ".now" to insert current time)  Test: hemoglobin Critical Value: 6.6  Name of Provider Notified: Larina Earthly, PA  Orders Received? Or Actions Taken?: Orders Received - See Orders for details

## 2022-10-11 NOTE — Progress Notes (Signed)
Received patient in bed to unit.  Alert and oriented.  Informed consent signed and in chart.   Treatment initiated: 7356 Treatment completed: 1248  Patient tolerated well.  Transported back to the room  Alert, without acute distress. Patient received 1unit of PRBC. Hand-off given to patient's nurse.   Access used: R IJ Access issues: None  Total UF removed: 2800 Medication(s) given: None Post HD weight: 76.9kg   10/11/22 1252  Vitals  Temp 97.9 F (36.6 C)  Temp Source Oral  BP 104/68  MAP (mmHg) 81  Pulse Rate 79  ECG Heart Rate 81  Resp (!) 25  Oxygen Therapy  SpO2 100 %  Post Treatment  Dialyzer Clearance Lightly streaked  Duration of HD Treatment -hour(s) 3.45 hour(s)  Hemodialysis Intake (mL) 0 mL  Liters Processed 90  Fluid Removed (mL) 2800 mL  Tolerated HD Treatment Yes  Hemodialysis Catheter Right Internal jugular Double lumen Permanent (Tunneled)  Placement Date/Time: 10/08/22 1105   Serial / Lot #: [id number]  Expiration Date: 06/23/27  Time Out: Correct patient;Correct site;Correct procedure  Maximum sterile barrier precautions: Hand hygiene;Cap;Mask;Sterile gown;Sterile gloves;Large sterile ...  Site Condition No complications  Blue Lumen Status Dead end cap in place;Heparin locked  Red Lumen Status Dead end cap in place;Heparin locked  Purple Lumen Status N/A  Catheter fill solution Heparin 1000 units/ml  Catheter fill volume (Arterial) 1.6 cc  Catheter fill volume (Venous) 1.6  Dressing Type Transparent  Dressing Status Antimicrobial disc in place;Clean, Dry, Intact  Interventions Other (Comment) (assessed)  Drainage Description None  Dressing Change Due 10/17/22  Post treatment catheter status Capped and Clamped    Jonluke Cobbins [facility]

## 2022-10-11 NOTE — Progress Notes (Addendum)
[facility] Progress Note   Subjective:   Seen in HD unit. Tolerating UF so far -goal 3L. Still having some coughing, feels like may have "crisis trying to start"  Denies cp, dyspnea.   Objective Vitals:   10/10/22 2048 10/11/22 0533 10/11/22 0840 10/11/22 0856  BP: 102/72 121/65 124/71 115/75  Pulse: 82 75 85 76  Resp: '16 16 12 15  '$ Temp: 98.5 F (36.9 C) 97.9 F (36.6 C) 98.1 F (36.7 C)   TempSrc: Oral Oral    SpO2: 97% 97% 95% 100%  Weight:  78.4 kg 79.5 kg   Height:       Physical Exam General:well appearing female in NAD Heart:RRR, no mrg Lungs:scattered wheezing, decreased in LLL  Abdomen:soft, NTND Extremities:no LE edema Dialysis Access: [facility]   Filed Weights   10/09/22 0432 10/11/22 0533 10/11/22 0840  Weight: 79.4 kg 78.4 kg 79.5 kg    Intake/Output Summary (Last 24 hours) at 10/11/2022 0908 Last data filed at 10/11/2022 0800 Gross per 24 hour  Intake 1263 ml  Output --  Net 1263 ml     Additional Objective Labs: Basic Metabolic Panel: Recent Labs  Lab 10/07/22 0755 10/08/22 0516 10/10/22 0734  NA 136 134* 133*  K 5.0 5.3* 4.9  CL 93* 95* 96*  CO2 '25 23 25  '$ GLUCOSE 103* 84 107*  BUN 48* 45* 30*  CREATININE 11.94* 10.84* 7.88*  CALCIUM 7.8* 7.3* 7.9*  PHOS 6.7* 5.5* 4.9*    Liver Function Tests: Recent Labs  Lab 10/06/22 0131 10/07/22 0755 10/08/22 0516 10/10/22 0734  AST 26  --   --   --   ALT 18  --   --   --   ALKPHOS 100  --   --   --   BILITOT 0.5  --   --   --   PROT 8.1  --   --   --   ALBUMIN 3.5 3.0* 2.8* 2.9*    No results for input(s): "LIPASE", "AMYLASE" in the last 168 hours. CBC: Recent Labs  Lab 10/06/22 0131 10/06/22 1856 10/07/22 0755 10/08/22 0516 10/10/22 0734  WBC 20.4* 16.0* 16.7* 13.0* 15.0*  NEUTROABS 14.8*  --   --   --   --   HGB 8.3* 8.2* 7.8* 7.0* 7.2*  HCT 23.9* 23.0* 21.3* 19.7* 20.6*  MCV 106.2* 105.0* 102.9* 104.8* 103.5*  PLT 375 344 335 284 353    Blood Culture     Component Value Date/Time   SDES  10/03/2022 2208    URINE, CLEAN CATCH Performed at [facility], [address] [place], [place] [address]    [facility]  10/03/2022 2208    NONE Performed at [facility], [address], [place], [place] [address]    CULT >=100,000 COLONIES/mL ESCHERICHIA COLI (A) 10/03/2022 2208   REPTSTATUS 10/06/2022 FINAL 10/03/2022 2208    CBG: Recent Labs  Lab 10/10/22 0723 10/10/22 1108 10/10/22 1632 10/10/22 2127 10/11/22 0729  GLUCAP 88 93 98 110* 163*     Studies/Results: CT ABDOMEN W CONTRAST  Result Date: 10/09/2022 CLINICAL DATA:  Left upper quadrant pain.  Pyelonephritis suspected. EXAM: CT ABDOMEN WITH CONTRAST TECHNIQUE: Multidetector CT imaging of the abdomen was performed using the standard protocol following bolus administration of intravenous contrast. RADIATION DOSE REDUCTION: This exam was performed according to the departmental dose-optimization program which includes automated exposure control, adjustment of the mA and/or kV according to patient size and/or use of iterative  reconstruction technique. CONTRAST:  71m OMNIPAQUE IOHEXOL 350 MG/ML SOLN COMPARISON:  10/03/2022 FINDINGS: Lower chest: Nodular consolidative opacity in the left lower lobe is new in the interval. Likely atelectatic, pneumonia is not excluded. Given the rapid interval appearance, neoplasm is considered unlikely. Hepatobiliary: No suspicious focal abnormality within the liver parenchyma. Gallbladder is decompressed. No intrahepatic or extrahepatic biliary dilation. Pancreas: No focal mass lesion. No dilatation of the main duct. No intraparenchymal cyst. No peripancreatic edema. Spleen: No splenomegaly. No focal mass lesion. Adrenals/Urinary Tract: No adrenal nodule or mass. Both kidneys are atrophic with multiple cystic lesions bilaterally, similar to prior. 9 mm lesion interpolar right kidney on 30/3 has attenuation too high to be a simple cyst.  This was not definitely seen on MRI 08/06/2016. 11 mm interpolar left renal lesion also with attenuation too high to be a simple cyst. This had imaging features compatible with a cyst on MRI 08/06/2016. Stomach/Bowel: Stomach is distended with food material. Duodenum is normally positioned as is the ligament of Treitz. Insert abdominal bowel Vascular/Lymphatic: There is mild atherosclerotic calcification of the abdominal aorta without aneurysm. There is no gastrohepatic or hepatoduodenal ligament lymphadenopathy. No retroperitoneal or mesenteric lymphadenopathy. Other: No intraperitoneal free fluid. Musculoskeletal: No worrisome lytic or sclerotic osseous abnormality. Gas in the subcutaneous fat of the left anterior abdominal wall is likely from injection. IMPRESSION: 1. No acute findings in the abdomen. Specifically, no findings to explain the patient's history of left upper quadrant pain. 2. Nodular consolidative opacity in the left lower lobe is new in the interval. Likely atelectatic, pneumonia is not excluded. Given the rapid interval appearance, neoplasm is considered unlikely. 3. 9 mm lesion interpolar right kidney has attenuation too high to be a simple cyst. This was not definitely seen on MRI 08/06/2016. While this may be a cyst complicated by proteinaceous debris or hemorrhage, neoplasm is not entirely excluded. Follow-up abdominal MRI with and without contrast in 3 months recommended to ensure stability. 4. Bilateral renal atrophy. 5.  Aortic Atherosclerosis (ICD10-I70.0). Electronically Signed   By: EMisty StanleyM.D.   On: 10/09/2022 14:44    Medications:  sodium chloride     albumin human     anticoagulant sodium citrate     cefTRIAXone (ROCEPHIN)  IV 2 g (10/10/22 2200)    atorvastatin  40 mg Oral Daily   azithromycin  500 mg Oral Daily   carvedilol  3.125 mg Oral BID WC   Chlorhexidine Gluconate Cloth  6 each Topical Q0600   cholecalciferol  2,000 Units Oral Daily   cinacalcet  30 mg  Oral Daily   heparin  5,000 Units Subcutaneous Q8H   hydroxyurea  500 mg Oral Daily   insulin aspart  0-9 Units Subcutaneous TID WC   ipratropium-albuterol  3 mL Nebulization TID   multivitamin  1 tablet Oral Daily   patiromer  8.4 g Oral Once per day on Sun Tue   sevelamer carbonate  1,600 mg Oral TID WC   sodium chloride flush  3 mL Intravenous Q12H    Dialysis Orders: [facility] MWF 3hr 476m RIJ TC, failed LUA loop 180NRe 400/500 EDW 74kg (left at 74.1 on 1/15) 1/2/2.5 UFR profile 2 Mircera 150 q2weeks (last given 1/10) Hectorol 43m61m  Assessment/Plan: Left sided pyelonephritis treated with Rocephin; positive for E.Coli on urine culture. On ABX. Cough/developing PNA/atelectasis - CT angio noted no PE or consolidation. Repeat CT on 1/20 w/consolidation likely atelectasis or possible PNA.   Remains over dry weight, check standing weight.  ESRD on MWF . Off schedule last week due to non functioning TDC.  Had [facility] exchange 1/19. Back on schedule  HTN/Vol - BP soft, nifedipine on hold, coreg decreased to 6.'25mg'$  BID - BP remain low, will decrease to 3.'125mg'$  BID.  Per weights not close to EDW, get standing weight tomorrow.  Max UF as tolerated.  BMM- CCa ok, phos at goal.  Continue Renvela 1-2 TIDM Anemia of CKD - Hgb 7.2. ESA due 1/24. does not appear to be in a sickle crisis at this time although she does sometimes have crisis with severe cold weather. Addendum: Hb 6.6 this am and pt symptomatic -will transfuse 1 unit pbcs. Sickle Cell Disease - Does not appear to be in crisis.  Nutrition - Renal diet w/fluid restrictions. Lesion on R kidney - noted on CT. 9 mm lesion interpolar right kidney has attenuation too high to be a simple cyst. This was not definitely seen on MRI 08/06/2016.  Could be complex cyst but unable to r/o neoplasm.  F/u MRI in 3 months to ensure stability.   Lynnda Child PA-C [facility] 10/11/2022,9:08 AM

## 2022-10-12 ENCOUNTER — Inpatient Hospital Stay (HOSPITAL_COMMUNITY): Payer: Medicare PPO

## 2022-10-12 DIAGNOSIS — D571 Sickle-cell disease without crisis: Secondary | ICD-10-CM | POA: Diagnosis not present

## 2022-10-12 DIAGNOSIS — N186 End stage renal disease: Secondary | ICD-10-CM | POA: Diagnosis not present

## 2022-10-12 DIAGNOSIS — D631 Anemia in chronic kidney disease: Secondary | ICD-10-CM | POA: Diagnosis not present

## 2022-10-12 DIAGNOSIS — R52 Pain, unspecified: Secondary | ICD-10-CM | POA: Diagnosis not present

## 2022-10-12 LAB — CBC
HCT: 23.2 % — ABNORMAL LOW (ref 36.0–46.0)
Hemoglobin: 8.4 g/dL — ABNORMAL LOW (ref 12.0–15.0)
MCH: 34.9 pg — ABNORMAL HIGH (ref 26.0–34.0)
MCHC: 36.2 g/dL — ABNORMAL HIGH (ref 30.0–36.0)
MCV: 96.3 fL (ref 80.0–100.0)
Platelets: 389 10*3/uL (ref 150–400)
RBC: 2.41 MIL/uL — ABNORMAL LOW (ref 3.87–5.11)
RDW: 21.2 % — ABNORMAL HIGH (ref 11.5–15.5)
WBC: 13.2 10*3/uL — ABNORMAL HIGH (ref 4.0–10.5)
nRBC: 3.4 % — ABNORMAL HIGH (ref 0.0–0.2)

## 2022-10-12 LAB — TYPE AND SCREEN
ABO/RH(D): A POS
Antibody Screen: NEGATIVE
Unit division: 0

## 2022-10-12 LAB — BPAM RBC
Blood Product Expiration Date: 202401242359
ISSUE DATE / TIME: 202401221127
Unit Type and Rh: 600

## 2022-10-12 MED ORDER — DARBEPOETIN ALFA 150 MCG/0.3ML IJ SOSY
150.0000 ug | PREFILLED_SYRINGE | Freq: Once | INTRAMUSCULAR | Status: AC
  Administered 2022-10-13: 150 ug via SUBCUTANEOUS
  Filled 2022-10-12: qty 0.3

## 2022-10-12 NOTE — Progress Notes (Signed)
PROGRESS NOTE        PATIENT DETAILS Name: Julie Wells Age: 52 y.o. Sex: female Date of Birth: March 26, 1971 Admit Date: 10/06/2022 Admitting Physician Vianne Bulls, MD QMV:HQION, Merlyn Albert, MD  Brief Summary: Patient is a 52 y.o.  female with history of ESRD on HD MWF, HTN, DM-2, sickle cell disease, HLD-presented with left flank pain (second ED visit)-thought to have pyelonephritis and subsequently admitted to the hospitalist service.  Significant events: 1/14>> presented to [facility] with left flank pain-manage medically-discharge home. 1/17>> presented to [facility]-worsening left flank pain-urine culture on 1/14 positive for E. coli-admit to [facility].    Significant studies: 1/14>> CT abdomen/pelvis: 3.8 cm left and 3.7 cm right ovarian cyst.  Bilateral atrophic kidneys. 1/17>> CT angio chest: No PE, bibasilar atelectatic changes. 1/20>> CT abdomen/pelvis: No acute findings in the abdomen.  Nodular consolidative opacity left lower lobe-likely atelectatic-PNA not excluded. 1/22>> CXR: Mild bibasilar subsegmental atelectasis-unchanged-no acute airspace disease.  Significant microbiology data: 1/14>> urine culture: E. coli  Procedures: None  Consults: Nephrology  Subjective: Pain in the left flank is better-she still continues to have coughing spells.  Feels that sickle cell crisis is brewing-she is starting to have some pain in legs.  Objective: Vitals: Blood pressure 102/70, pulse 86, temperature 97.9 F (36.6 C), temperature source Oral, resp. rate 20, height '5\' 2"'$  (1.575 m), weight 76.9 kg, last menstrual period 09/14/2022, SpO2 100 %.   Exam: Gen Exam:Alert awake-not in any distress HEENT:atraumatic, normocephalic Chest: B/L clear to auscultation anteriorly CVS:S1S2 regular Abdomen:soft non tender, non distended Extremities:no edema Neurology: Non focal Skin: no rash  Pertinent Labs/Radiology:    Latest Ref Rng & Units 10/12/2022    6:51 AM  10/11/2022    8:47 AM 10/10/2022    7:34 AM  CBC  WBC 4.0 - 10.5 K/uL 13.2  12.9  15.0   Hemoglobin 12.0 - 15.0 g/dL 8.4  6.6  7.2   Hematocrit 36.0 - 46.0 % 23.2  18.9  20.6   Platelets 150 - 400 K/uL 389  387  353     Lab Results  Component Value Date   NA 133 (L) 10/11/2022   K 4.3 10/11/2022   CL 94 (L) 10/11/2022   CO2 25 10/11/2022      Assessment/Plan: Severe left flank pain-likely due to pyelonephritis involving left kidney-and now due to developing PNA at left lung base  Overall improved-pain is significantly less intense per patient Will complete 7 days of Rocephin today-will complete 3 days of Zithromax today. Continue incentive spirometry/bronchodilators and encourage mobilization overall improved Although continues to have left flank pain-significantly less intense than on initial presentation Plan is to continue supportive care and reassess on 1/24 for potential discharge.  ESRD on HD MWF Nephrology following.  Malfunctioning tunneled HD catheter Catheter exchanged by IR on 1/19-underwent HD without any issues post catheter exchange.  Hyperkalemia Resolved with HD   Normocytic anemia Due to combination of ESRD and sickle cell disease Significant drop in hemoglobin on 1/22-s/p transfusion while on dialysis.  CBC stable this morning.     HTN BP still soft but stable Continue reduced dose Coreg Nifedipine on hold.   HLD Continue statin   Sickle cell disease Feels that crisis is developing Encourage hydration Continue narcotics    Bilateral ovarian cyst Seen incidentally on CT abdomen on 1/14 Likely not  the cause for her left flank pain Outpatient follow-up with PCP/GYN  9 mm right kidney lesion Radiology recommending repeat MRI in 3 months to ensure stability.  Obesity: Estimated body mass index is 31.01 kg/m as calculated from the following:   Height as of this encounter: '5\' 2"'$  (1.575 m).   Weight as of this encounter: 76.9 kg.   Code  status:   Code Status: Full Code   DVT Prophylaxis: heparin injection 5,000 Units Start: 10/06/22 2200   Family Communication: None at bedside   Disposition Plan: Status is: Observation The patient will require care spanning > 2 midnights and should be moved to inpatient because: Severity of illness   Planned Discharge Destination:Home hopefully in the next 1-2 days.   Diet: Diet Order             Diet renal/carb modified with fluid restriction Diet-HS Snack? Nothing; Fluid restriction: 1200 mL Fluid; Room service appropriate? Yes; Fluid consistency: Thin  Diet effective now                     Antimicrobial agents: Anti-infectives (From admission, onward)    Start     Dose/Rate Route Frequency Ordered Stop   10/10/22 1000  azithromycin (ZITHROMAX) tablet 500 mg        500 mg Oral Daily 10/10/22 0735 10/12/22 0846   10/08/22 1145  ceFAZolin (ANCEF) IVPB 2g/100 mL premix        2 g 200 mL/hr over 30 Minutes Intravenous  Once 10/08/22 1059 10/08/22 1138   10/08/22 1101  ceFAZolin (ANCEF) 2-4 GM/100ML-% IVPB       Note to Pharmacy: Trixie Dredge C: cabinet override      10/08/22 1101 10/08/22 2314   10/06/22 2200  cefTRIAXone (ROCEPHIN) 2 g in sodium chloride 0.9 % 100 mL IVPB        2 g 200 mL/hr over 30 Minutes Intravenous Every 24 hours 10/06/22 1732 10/13/22 2159   10/06/22 0230  cefTRIAXone (ROCEPHIN) 1 g in sodium chloride 0.9 % 100 mL IVPB        1 g 200 mL/hr over 30 Minutes Intravenous  Once 10/06/22 0220 10/06/22 0326        MEDICATIONS: Scheduled Meds:  sodium chloride   Intravenous Once   atorvastatin  40 mg Oral Daily   carvedilol  3.125 mg Oral BID WC   Chlorhexidine Gluconate Cloth  6 each Topical Q0600   cholecalciferol  2,000 Units Oral Daily   cinacalcet  30 mg Oral Daily   heparin  5,000 Units Subcutaneous Q8H   hydroxyurea  500 mg Oral Daily   ipratropium-albuterol  3 mL Nebulization TID   multivitamin  1 tablet Oral Daily    patiromer  8.4 g Oral Once per day on Sun Tue   sevelamer carbonate  1,600 mg Oral TID WC   sodium chloride flush  3 mL Intravenous Q12H   Continuous Infusions:  sodium chloride     albumin human     cefTRIAXone (ROCEPHIN)  IV 2 g (10/11/22 2204)   PRN Meds:.sodium chloride, acetaminophen **OR** acetaminophen, albuterol, benzonatate, cyclobenzaprine, guaiFENesin-dextromethorphan, hydrALAZINE, HYDROmorphone (DILAUDID) injection, ondansetron **OR** ondansetron (ZOFRAN) IV, mouth rinse, oxyCODONE, polyethylene glycol, sodium chloride flush   I have personally reviewed following labs and imaging studies  LABORATORY DATA: CBC: Recent Labs  Lab 10/06/22 0131 10/06/22 1856 10/07/22 0755 10/08/22 0516 10/10/22 0734 10/11/22 0847 10/12/22 0651  WBC 20.4*   < > 16.7* 13.0* 15.0* 12.9* 13.2*  NEUTROABS 14.8*  --   --   --   --   --   --   HGB 8.3*   < > 7.8* 7.0* 7.2* 6.6* 8.4*  HCT 23.9*   < > 21.3* 19.7* 20.6* 18.9* 23.2*  MCV 106.2*   < > 102.9* 104.8* 103.5* 102.7* 96.3  PLT 375   < > 335 284 353 387 389   < > = values in this interval not displayed.     Basic Metabolic Panel: Recent Labs  Lab 10/06/22 0131 10/06/22 1856 10/07/22 0755 10/08/22 0516 10/10/22 0734 10/11/22 0847  NA 136  --  136 134* 133* 133*  K 4.8  --  5.0 5.3* 4.9 4.3  CL 95*  --  93* 95* 96* 94*  CO2 27  --  '25 23 25 25  '$ GLUCOSE 108*  --  103* 84 107* 111*  BUN 40*  --  48* 45* 30* 41*  CREATININE 9.76* 11.04* 11.94* 10.84* 7.88* 9.98*  CALCIUM 7.7*  --  7.8* 7.3* 7.9* 7.9*  PHOS  --   --  6.7* 5.5* 4.9* 5.4*     GFR: Estimated Creatinine Clearance: 6.3 mL/min (A) (by C-G formula based on SCr of 9.98 mg/dL (H)).  Liver Function Tests: Recent Labs  Lab 10/06/22 0131 10/07/22 0755 10/08/22 0516 10/10/22 0734 10/11/22 0847  AST 26  --   --   --   --   ALT 18  --   --   --   --   ALKPHOS 100  --   --   --   --   BILITOT 0.5  --   --   --   --   PROT 8.1  --   --   --   --   ALBUMIN 3.5  3.0* 2.8* 2.9* 2.8*    No results for input(s): "LIPASE", "AMYLASE" in the last 168 hours. No results for input(s): "AMMONIA" in the last 168 hours.  Coagulation Profile: No results for input(s): "INR", "PROTIME" in the last 168 hours.  Cardiac Enzymes: No results for input(s): "CKTOTAL", "CKMB", "CKMBINDEX", "TROPONINI" in the last 168 hours.  BNP (last 3 results) No results for input(s): "PROBNP" in the last 8760 hours.  Lipid Profile: No results for input(s): "CHOL", "HDL", "LDLCALC", "TRIG", "CHOLHDL", "LDLDIRECT" in the last 72 hours.  Thyroid Function Tests: No results for input(s): "TSH", "T4TOTAL", "FREET4", "T3FREE", "THYROIDAB" in the last 72 hours.  Anemia Panel: No results for input(s): "VITAMINB12", "FOLATE", "FERRITIN", "TIBC", "IRON", "RETICCTPCT" in the last 72 hours.  Urine analysis:    Component Value Date/Time   COLORURINE YELLOW 10/06/2022 1317   APPEARANCEUR HAZY (A) 10/06/2022 1317   LABSPEC 1.015 10/06/2022 1317   PHURINE 8.5 (H) 10/06/2022 1317   GLUCOSEU NEGATIVE 10/06/2022 1317   HGBUR SMALL (A) 10/06/2022 1317   BILIRUBINUR NEGATIVE 10/06/2022 1317   KETONESUR NEGATIVE 10/06/2022 1317   PROTEINUR 100 (A) 10/06/2022 1317   UROBILINOGEN 0.2 02/13/2015 1823   NITRITE NEGATIVE 10/06/2022 1317   LEUKOCYTESUR SMALL (A) 10/06/2022 1317    Sepsis Labs: Lactic Acid, Venous No results found for: "LATICACIDVEN"  MICROBIOLOGY: Recent Results (from the past 240 hour(s))  Resp panel by RT-PCR (RSV, Flu A&B, Covid) Nasopharyngeal Swab     Status: None   Collection Time: 10/03/22  3:38 PM   Specimen: Nasopharyngeal Swab; Nasal Swab  Result Value Ref Range Status   SARS Coronavirus 2 by RT PCR NEGATIVE NEGATIVE Final    Comment: (NOTE) SARS-CoV-2  target nucleic acids are NOT DETECTED.  The SARS-CoV-2 RNA is generally detectable in upper respiratory specimens during the acute phase of infection. The lowest concentration of SARS-CoV-2 viral copies this  assay can detect is 138 copies/mL. A negative result does not preclude SARS-Cov-2 infection and should not be used as the sole basis for treatment or other patient management decisions. A negative result may occur with  improper specimen collection/handling, submission of specimen other than nasopharyngeal swab, presence of viral mutation(s) within the areas targeted by this assay, and inadequate number of viral copies(<138 copies/mL). A negative result must be combined with clinical observations, patient history, and epidemiological information. The expected result is Negative.  Fact Sheet for Patients:  EntrepreneurPulse.com.au  Fact Sheet for Healthcare Providers:  IncredibleEmployment.be  This test is no t yet approved or cleared by the [place] FDA and  has been authorized for detection and/or diagnosis of SARS-CoV-2 by FDA under an Emergency Use Authorization (EUA). This EUA will remain  in effect (meaning this test can be used) for the duration of the COVID-19 declaration under Section 564(b)(1) of the Act, 21 U.S.C.section 360bbb-3(b)(1), unless the authorization is terminated  or revoked sooner.       Influenza A by PCR NEGATIVE NEGATIVE Final   Influenza B by PCR NEGATIVE NEGATIVE Final    Comment: (NOTE) The Xpert Xpress SARS-CoV-2/FLU/RSV plus assay is intended as an aid in the diagnosis of influenza from Nasopharyngeal swab specimens and should not be used as a sole basis for treatment. Nasal washings and aspirates are unacceptable for Xpert Xpress SARS-CoV-2/FLU/RSV testing.  Fact Sheet for Patients: EntrepreneurPulse.com.au  Fact Sheet for Healthcare Providers: IncredibleEmployment.be  This test is not yet approved or cleared by the [place] FDA and has been authorized for detection and/or diagnosis of SARS-CoV-2 by FDA under an Emergency Use Authorization (EUA). This EUA will  remain in effect (meaning this test can be used) for the duration of the COVID-19 declaration under Section 564(b)(1) of the Act, 21 U.S.C. section 360bbb-3(b)(1), unless the authorization is terminated or revoked.     Resp Syncytial Virus by PCR NEGATIVE NEGATIVE Final    Comment: (NOTE) Fact Sheet for Patients: EntrepreneurPulse.com.au  Fact Sheet for Healthcare Providers: IncredibleEmployment.be  This test is not yet approved or cleared by the [place] FDA and has been authorized for detection and/or diagnosis of SARS-CoV-2 by FDA under an Emergency Use Authorization (EUA). This EUA will remain in effect (meaning this test can be used) for the duration of the COVID-19 declaration under Section 564(b)(1) of the Act, 21 U.S.C. section 360bbb-3(b)(1), unless the authorization is terminated or revoked.  Performed at [facility], [address] [place], [place] [address]   Urine Culture     Status: Abnormal   Collection Time: 10/03/22 10:08 PM   Specimen: Urine, Clean Catch  Result Value Ref Range Status   Specimen Description   Final    URINE, CLEAN CATCH Performed at [facility] , [address] [place], [place] [address]    Special Requests   Final    NONE Performed at [facility], [address] [place], [place] [address]    Culture >=100,000 COLONIES/mL ESCHERICHIA COLI (A)  Final   Report Status 10/06/2022 FINAL  Final   Organism ID, Bacteria ESCHERICHIA COLI (A)  Final      Susceptibility   Escherichia coli - MIC*    AMPICILLIN <=2 SENSITIVE Sensitive  CEFAZOLIN <=4 SENSITIVE Sensitive     CEFEPIME <=0.12 SENSITIVE Sensitive     CEFTRIAXONE <=0.25 SENSITIVE Sensitive     CIPROFLOXACIN <=0.25 SENSITIVE Sensitive     GENTAMICIN <=1 SENSITIVE Sensitive     IMIPENEM <=0.25 SENSITIVE Sensitive     NITROFURANTOIN <=16 SENSITIVE Sensitive     TRIMETH/SULFA <=20 SENSITIVE Sensitive      AMPICILLIN/SULBACTAM <=2 SENSITIVE Sensitive     PIP/TAZO <=4 SENSITIVE Sensitive     * >=100,000 COLONIES/mL ESCHERICHIA COLI    RADIOLOGY STUDIES/RESULTS: DG Chest Port 1 View  Result Date: 10/12/2022 CLINICAL DATA:  Shortness of breath. EXAM: PORTABLE CHEST 1 VIEW COMPARISON:  AP chest 10/06/2022 FINDINGS: Redemonstration of right internal jugular dual-lumen central venous catheter with tip overlying the superior vena cava/right atrial junction. Cardiac silhouette is again at the upper limits of normal size. Mediastinal contours are within normal limits. Mild bibasilar linear subsegmental atelectasis, unchanged. No pleural effusion or pneumothorax. Mild multilevel degenerative disc changes of the thoracic spine. IMPRESSION: Mild bibasilar subsegmental atelectasis, unchanged. No acute airspace disease. Electronically Signed   By: Yvonne Kendall M.D.   On: 10/12/2022 08:18     LOS: 5 days   Oren Binet, MD  [facility]    To contact the attending provider between 7A-7P or the covering provider during after hours 7P-7A, please log into the web site [email or web address] and access using universal [place] password for that web site. If you do not have the password, please call the hospital operator.  10/12/2022, 9:45 AM

## 2022-10-12 NOTE — Progress Notes (Signed)
[facility] Progress Note   Subjective:  Completed dialysis yesterday net UF 2.8L. Seen in room. Transfused prbcs yesterday. Breathing improving- on room air.  Had breathing treatment today. Feels better.   Objective Vitals:   10/11/22 2200 10/12/22 0524 10/12/22 0838 10/12/22 0844  BP: 116/68 102/63  102/70  Pulse: 90 83 82 86  Resp: '18 18 20 20  '$ Temp: 98 F (36.7 C) 98.5 F (36.9 C)  97.9 F (36.6 C)  TempSrc: Oral Oral  Oral  SpO2: 98% 100% 100% 100%  Weight:      Height:       Physical Exam General: well appearing female in NAD Heart: RRR, no mrg Lungs: scattered wheezing  Abdomen: soft, NTND Extremities: no LE edema Dialysis Access: [facility]   Filed Weights   10/11/22 0533 10/11/22 0840 10/11/22 1300  Weight: 78.4 kg 79.5 kg 76.9 kg    Intake/Output Summary (Last 24 hours) at 10/12/2022 1122 Last data filed at 10/12/2022 0800 Gross per 24 hour  Intake 1610.67 ml  Output 2800 ml  Net -1189.33 ml     Additional Objective Labs: Basic Metabolic Panel: Recent Labs  Lab 10/08/22 0516 10/10/22 0734 10/11/22 0847  NA 134* 133* 133*  K 5.3* 4.9 4.3  CL 95* 96* 94*  CO2 '23 25 25  '$ GLUCOSE 84 107* 111*  BUN 45* 30* 41*  CREATININE 10.84* 7.88* 9.98*  CALCIUM 7.3* 7.9* 7.9*  PHOS 5.5* 4.9* 5.4*    Liver Function Tests: Recent Labs  Lab 10/06/22 0131 10/07/22 0755 10/08/22 0516 10/10/22 0734 10/11/22 0847  AST 26  --   --   --   --   ALT 18  --   --   --   --   ALKPHOS 100  --   --   --   --   BILITOT 0.5  --   --   --   --   PROT 8.1  --   --   --   --   ALBUMIN 3.5   < > 2.8* 2.9* 2.8*   < > = values in this interval not displayed.    No results for input(s): "LIPASE", "AMYLASE" in the last 168 hours. CBC: Recent Labs  Lab 10/06/22 0131 10/06/22 1856 10/07/22 0755 10/08/22 0516 10/10/22 0734 10/11/22 0847 10/12/22 0651  WBC 20.4*   < > 16.7* 13.0* 15.0* 12.9* 13.2*  NEUTROABS 14.8*  --   --   --   --   --   --   HGB 8.3*    < > 7.8* 7.0* 7.2* 6.6* 8.4*  HCT 23.9*   < > 21.3* 19.7* 20.6* 18.9* 23.2*  MCV 106.2*   < > 102.9* 104.8* 103.5* 102.7* 96.3  PLT 375   < > 335 284 353 387 389   < > = values in this interval not displayed.    Blood Culture    Component Value Date/Time   SDES  10/03/2022 2208    URINE, CLEAN CATCH Performed at [facility], [address]elaine Bhat [place], [place] [address]    [facility]  10/03/2022 2208    NONE Performed at [facility], [address] [place], [place] [address]    CULT >=100,000 COLONIES/mL ESCHERICHIA COLI (A) 10/03/2022 2208   REPTSTATUS 10/06/2022 FINAL 10/03/2022 2208    CBG: Recent Labs  Lab 10/10/22 0723 10/10/22 1108 10/10/22 1632 10/10/22 2127 10/11/22 0729  GLUCAP 88 93 98 110* 163*  Studies/Results: DG Chest Port 1 View  Result Date: 10/12/2022 CLINICAL DATA:  Shortness of breath. EXAM: PORTABLE CHEST 1 VIEW COMPARISON:  AP chest 10/06/2022 FINDINGS: Redemonstration of right internal jugular dual-lumen central venous catheter with tip overlying the superior vena cava/right atrial junction. Cardiac silhouette is again at the upper limits of normal size. Mediastinal contours are within normal limits. Mild bibasilar linear subsegmental atelectasis, unchanged. No pleural effusion or pneumothorax. Mild multilevel degenerative disc changes of the thoracic spine. IMPRESSION: Mild bibasilar subsegmental atelectasis, unchanged. No acute airspace disease. Electronically Signed   By: Yvonne Kendall M.D.   On: 10/12/2022 08:18    Medications:  sodium chloride     albumin human     cefTRIAXone (ROCEPHIN)  IV 2 g (10/11/22 2204)    sodium chloride   Intravenous Once   atorvastatin  40 mg Oral Daily   carvedilol  3.125 mg Oral BID WC   Chlorhexidine Gluconate Cloth  6 each Topical Q0600   cholecalciferol  2,000 Units Oral Daily   cinacalcet  30 mg Oral Daily   heparin  5,000 Units Subcutaneous Q8H   hydroxyurea  500 mg Oral Daily    ipratropium-albuterol  3 mL Nebulization TID   multivitamin  1 tablet Oral Daily   patiromer  8.4 g Oral Once per day on Sun Tue   sevelamer carbonate  1,600 mg Oral TID WC   sodium chloride flush  3 mL Intravenous Q12H    Dialysis Orders: [facility] MWF 3hr 52mn RIJ TC, failed LUA loop 180NRe 400/500 EDW 74kg (left at 74.1 on 1/15) 1/2/2.5 UFR profile 2 Mircera 150 q2weeks (last given 1/10) Hectorol 245m   Assessment/Plan: Left sided pyelonephritis treated with Rocephin; positive for E.Coli on urine culture. On ABX. Cough/developing PNA/atelectasis - CT angio noted no PE or consolidation. Repeat CT on 1/20 w/consolidation likely atelectasis or possible PNA.   Remains over dry weight, check standing weight. ESRD on MWF . Off schedule last week due to non functioning TDC.  [facility]. Next HD 1/24.  HTN/Vol - BP soft, nifedipine on hold, coreg decreased to 6.'25mg'$  BID - BP remain low, will decrease to 3.'125mg'$  BID.  Per weights not close to EDW, get standing weight tomorrow.  Max UF as tolerated.  BMM- CCa ok, phos at goal.  Continue Renvela 1-2 TIDM Anemia of CKD - Hgb 8.4 s/p 1 u prbcs on 1/22.  ESA due 1/24- will order . does not appear to be in a sickle crisis at this time although she does sometimes have crisis with severe cold weather.  Sickle Cell Disease - Does not appear to be in crisis.  Nutrition - Renal diet w/fluid restrictions. Lesion on R kidney - noted on CT. 9 mm lesion interpolar right kidney has attenuation too high to be a simple cyst. This was not definitely seen on MRI 08/06/2016.  Could be complex cyst but unable to r/o neoplasm.  F/u MRI in 3 months to ensure stability.   OgLynnda ChildA-C [facility] 10/12/2022,11:22 AM

## 2022-10-12 NOTE — Progress Notes (Signed)
Pt's chart reviewed and pt for possible d/c tomorrow per attending note. Pt due for HD tomorrow. Case discussed with attending and nephrology staff regarding pt's appropriateness for out-pt HD tomorrow to avoid day of d/c HD. Attending felt it most appropriate for pt to receive inpt HD on 1st shift tomorrow. Inpt HD unit and renal PA aware of this request. Will assist as needed.   Melven Sartorius Renal Navigator [phone]

## 2022-10-12 NOTE — Care Management Important Message (Signed)
Important Message  Patient Details  Name: Julie Wells MRN: [id number] Date of Birth: November 12, 1970   Medicare Important Message Given:  Yes     Desere Gwin [address] 10/12/2022, 12:58 PM

## 2022-10-13 DIAGNOSIS — N186 End stage renal disease: Secondary | ICD-10-CM | POA: Diagnosis not present

## 2022-10-13 DIAGNOSIS — D631 Anemia in chronic kidney disease: Secondary | ICD-10-CM | POA: Diagnosis not present

## 2022-10-13 DIAGNOSIS — R52 Pain, unspecified: Secondary | ICD-10-CM | POA: Diagnosis not present

## 2022-10-13 DIAGNOSIS — Z9289 Personal history of other medical treatment: Secondary | ICD-10-CM

## 2022-10-13 DIAGNOSIS — D571 Sickle-cell disease without crisis: Secondary | ICD-10-CM | POA: Diagnosis not present

## 2022-10-13 HISTORY — DX: Personal history of other medical treatment: Z92.89

## 2022-10-13 MED ORDER — HEPARIN SODIUM (PORCINE) 1000 UNIT/ML DIALYSIS: 1000.0000 [IU] | INTRAMUSCULAR | Status: DC | PRN

## 2022-10-13 MED ORDER — BENZONATATE 200 MG PO CAPS: 200.0000 mg | ORAL_CAPSULE | Freq: Three times a day (TID) | ORAL | 0 refills | Status: DC | PRN

## 2022-10-13 MED ORDER — LIDOCAINE-PRILOCAINE 2.5-2.5 % EX CREA: 1.0000 | TOPICAL_CREAM | CUTANEOUS | Status: DC | PRN

## 2022-10-13 MED ORDER — ALBUTEROL SULFATE HFA 108 (90 BASE) MCG/ACT IN AERS: 2.0000 | INHALATION_SPRAY | RESPIRATORY_TRACT | 0 refills | Status: AC | PRN

## 2022-10-13 MED ORDER — PENTAFLUOROPROP-TETRAFLUOROETH EX AERO: 1.0000 | INHALATION_SPRAY | CUTANEOUS | Status: DC | PRN

## 2022-10-13 MED ORDER — HEPARIN SODIUM (PORCINE) 1000 UNIT/ML DIALYSIS: 20.0000 [IU]/kg | INTRAMUSCULAR | Status: DC | PRN

## 2022-10-13 MED ORDER — ANTICOAGULANT SODIUM CITRATE 4% (200MG/5ML) IV SOLN
5.0000 mL | Status: DC | PRN
  Filled 2022-10-13: qty 5

## 2022-10-13 MED ORDER — HEPARIN SODIUM (PORCINE) 1000 UNIT/ML IJ SOLN
INTRAMUSCULAR | Status: AC
  Administered 2022-10-13: 3200 [IU] via INTRAVENOUS_CENTRAL
  Filled 2022-10-13: qty 4

## 2022-10-13 MED ORDER — AZITHROMYCIN 500 MG PO TABS: 500.0000 mg | ORAL_TABLET | Freq: Every day | ORAL | 0 refills | Status: DC

## 2022-10-13 MED ORDER — CARVEDILOL 6.25 MG PO TABS: 3.2500 mg | ORAL_TABLET | Freq: Two times a day (BID) | ORAL | 0 refills | Status: AC

## 2022-10-13 MED ORDER — ALTEPLASE 2 MG IJ SOLR: 2.0000 mg | Freq: Once | INTRAMUSCULAR | Status: DC | PRN

## 2022-10-13 MED ORDER — AZITHROMYCIN 500 MG PO TABS
500.0000 mg | ORAL_TABLET | Freq: Every day | ORAL | Status: DC
  Administered 2022-10-13: 500 mg via ORAL
  Filled 2022-10-13: qty 1

## 2022-10-13 MED ORDER — LIDOCAINE HCL (PF) 1 % IJ SOLN: 5.0000 mL | INTRAMUSCULAR | Status: DC | PRN

## 2022-10-13 MED ORDER — CYCLOBENZAPRINE HCL 5 MG PO TABS: 5.0000 mg | ORAL_TABLET | Freq: Three times a day (TID) | ORAL | 0 refills | Status: AC | PRN

## 2022-10-13 NOTE — Progress Notes (Signed)
Pt d/c to home today. Contacted [facility] to advise clinic of pt's d/c today and that pt should resume on Friday.   Melven Sartorius Renal Navigator [phone]

## 2022-10-13 NOTE — Plan of Care (Signed)

## 2022-10-13 NOTE — TOC Transition Note (Signed)
Transition of Care [facility]) - CM/SW Discharge Note   Patient Details  Name: MYESHA STILLION MRN: [id number] Date of Birth: Jul 27, 1971  [facility] CM/SW Contact:  Tom-Johnson, Renea Ee, RN Phone Number: 10/13/2022, 1:33 PM   Clinical Narrative:     Patient is scheduled for discharge today. Outpatient f/u info on AVS. Non TOC needs or recommendations noted. Denies any needs. Family tot transport at discharge. No further TOC needs noted.    Final next level of care: Home/Self Care Barriers to Discharge: Barriers Resolved   Patient Goals and CMS Choice CMS Medicare.gov Compare Post Acute Care list provided to:: Patient Choice offered to / list presented to : NA  Discharge Placement                  Patient to be transferred to facility by: Family      Discharge Plan and Services Additional resources added to the After Visit Summary for                  DME Arranged: N/A DME Agency: NA       HH Arranged: NA HH Agency: NA        Social Determinants of Health (SDOH) Interventions SDOH Screenings   Food Insecurity: No Food Insecurity (10/06/2022)  Housing: Low Risk  (10/06/2022)  Transportation Needs: No Transportation Needs (10/06/2022)  Utilities: Not At Risk (10/06/2022)  Tobacco Use: Low Risk  (10/08/2022)     Readmission Risk Interventions     No data to display

## 2022-10-13 NOTE — Progress Notes (Signed)
Received patient in bed to unit.  Alert and oriented.  Informed consent signed and in chart.   Treatment initiated: 8241 Treatment completed: 1219  Patient tolerated well.  Transported back to the room  Alert, without acute distress.  Hand-off given to patient's nurse.   Access used: Catheter Access issues: none  Total UF removed: 2L Medication(s) given: none Post HD VS: 97.7,122/66,84,13,98% Post HD weight: 79.3kg   [facility]

## 2022-10-13 NOTE — Progress Notes (Signed)
[facility] Progress Note   Subjective:  Seen in [place]. Tolerating UF. Feels better, but still some cough/wheeze. Denies cp, dyspnea, n/v.   Objective Vitals:   10/13/22 0831 10/13/22 0839 10/13/22 0900 10/13/22 0930  BP: 122/79 120/67 130/86 128/80  Pulse: 92 79 91   Resp: '19 15 11 20  '$ Temp: 98.2 F (36.8 C)     TempSrc: Oral     SpO2: 98% 99% 99% 98%  Weight: 79.5 kg     Height:       Physical Exam General: well appearing female in NAD Heart: RRR, no mrg Lungs: scattered wheezing  Abdomen: soft, NTND Extremities: no LE edema Dialysis Access: [facility]   Filed Weights   10/11/22 1300 10/13/22 0529 10/13/22 0831  Weight: 76.9 kg 78.6 kg 79.5 kg    Intake/Output Summary (Last 24 hours) at 10/13/2022 0949 Last data filed at 10/13/2022 0753 Gross per 24 hour  Intake 880 ml  Output 0 ml  Net 880 ml     Additional Objective Labs: Basic Metabolic Panel: Recent Labs  Lab 10/08/22 0516 10/10/22 0734 10/11/22 0847  NA 134* 133* 133*  K 5.3* 4.9 4.3  CL 95* 96* 94*  CO2 '23 25 25  '$ GLUCOSE 84 107* 111*  BUN 45* 30* 41*  CREATININE 10.84* 7.88* 9.98*  CALCIUM 7.3* 7.9* 7.9*  PHOS 5.5* 4.9* 5.4*    Liver Function Tests: Recent Labs  Lab 10/08/22 0516 10/10/22 0734 10/11/22 0847  ALBUMIN 2.8* 2.9* 2.8*    No results for input(s): "LIPASE", "AMYLASE" in the last 168 hours. CBC: Recent Labs  Lab 10/07/22 0755 10/08/22 0516 10/10/22 0734 10/11/22 0847 10/12/22 0651  WBC 16.7* 13.0* 15.0* 12.9* 13.2*  HGB 7.8* 7.0* 7.2* 6.6* 8.4*  HCT 21.3* 19.7* 20.6* 18.9* 23.2*  MCV 102.9* 104.8* 103.5* 102.7* 96.3  PLT 335 284 353 387 389    Blood Culture    Component Value Date/Time   SDES  10/03/2022 2208    URINE, CLEAN CATCH Performed at [facility], [address] [place] Bhat [place], [place] [address]    [facility]  10/03/2022 2208    NONE Performed at [facility], [address] [place], [place] [address]    CULT >=100,000  COLONIES/mL ESCHERICHIA COLI (A) 10/03/2022 2208   REPTSTATUS 10/06/2022 FINAL 10/03/2022 2208    CBG: Recent Labs  Lab 10/10/22 0723 10/10/22 1108 10/10/22 1632 10/10/22 2127 10/11/22 0729  GLUCAP 88 93 98 110* 163*     Studies/Results: DG Chest Port 1 View  Result Date: 10/12/2022 CLINICAL DATA:  Shortness of breath. EXAM: PORTABLE CHEST 1 VIEW COMPARISON:  AP chest 10/06/2022 FINDINGS: Redemonstration of right internal jugular dual-lumen central venous catheter with tip overlying the superior vena cava/right atrial junction. Cardiac silhouette is again at the upper limits of normal size. Mediastinal contours are within normal limits. Mild bibasilar linear subsegmental atelectasis, unchanged. No pleural effusion or pneumothorax. Mild multilevel degenerative disc changes of the thoracic spine. IMPRESSION: Mild bibasilar subsegmental atelectasis, unchanged. No acute airspace disease. Electronically Signed   By: Yvonne Kendall M.D.   On: 10/12/2022 08:18    Medications:  sodium chloride     albumin human     anticoagulant sodium citrate      sodium chloride   Intravenous Once   atorvastatin  40 mg Oral Daily   carvedilol  3.125 mg Oral BID WC   Chlorhexidine Gluconate Cloth  6 each Topical Q0600   cholecalciferol  2,000  Units Oral Daily   cinacalcet  30 mg Oral Daily   darbepoetin (ARANESP) injection - DIALYSIS  150 mcg Subcutaneous Once   heparin  5,000 Units Subcutaneous Q8H   hydroxyurea  500 mg Oral Daily   ipratropium-albuterol  3 mL Nebulization TID   multivitamin  1 tablet Oral Daily   patiromer  8.4 g Oral Once per day on Sun Tue   sevelamer carbonate  1,600 mg Oral TID WC   sodium chloride flush  3 mL Intravenous Q12H    Dialysis Orders: [facility] MWF 3hr 66mn RIJ TC, failed LUA loop 180NRe 400/500 EDW 74kg (left at 74.1 on 1/15) 1/2/2.5 UFR profile 2 Mircera 150 q2weeks (last given 1/10) Hectorol 217m   Assessment/Plan: Left sided pyelonephritis treated with  Rocephin; positive for E.Coli on urine culture. On ABX. Cough/developing PNA/atelectasis - CT angio noted no PE or consolidation. Repeat CT on 1/20 w/consolidation likely atelectasis or possible PNA.   Remains over dry weight, check standing weight. ESRD on MWF . Off schedule last week due to non functioning TDC.  [facility] Back on schedule. HD today.  HTN/Vol - BP soft, nifedipine on hold, coreg decreased to 6.'25mg'$  BID - BP remain low, will decrease to 3.'125mg'$  BID.  Per weights not close to EDW, get standing weight tomorrow.  Max UF as tolerated.  BMM- CCa ok, phos at goal.  Continue Renvela 1-2 TIDM Anemia of CKD - Hgb 8.4 s/p 1 u prbcs on 1/22.  ESA due 1/24- will order . does not appear to be in a sickle crisis at this time although she does sometimes have crisis with severe cold weather.  Sickle Cell Disease - Does not appear to be in crisis.  Nutrition - Renal diet w/fluid restrictions. Lesion on R kidney - noted on CT. 9 mm lesion interpolar right kidney has attenuation too high to be a simple cyst. This was not definitely seen on MRI 08/06/2016.  Could be complex cyst but unable to r/o neoplasm.  F/u MRI in 3 months to ensure stability.   OgLynnda ChildA-C [facility] 10/13/2022,9:49 AM

## 2022-10-13 NOTE — Progress Notes (Signed)
DISCHARGE NOTE HOME [facility] to be discharged Home per MD order. Discussed prescriptions and follow up appointments with the patient. Prescriptions given to patient; medication list explained in detail. Patient verbalized understanding.  Skin clean, dry and intact without evidence of skin break down, no evidence of skin tears noted. IV catheter discontinued intact. Site without signs and symptoms of complications. Dressing and pressure applied. Pt denies pain at the site currently. No complaints noted.  Patient free of lines, drains, and wounds.   An After Visit Summary (AVS) was printed and given to the patient. Patient escorted via wheelchair, and discharged home via private auto.  Hassell Halim, LPN

## 2022-10-13 NOTE — Discharge Summary (Signed)
PATIENT DETAILS Name: Julie Wells Age: 52 y.o. Sex: female Date of Birth: [birth year] MRN: [id number]. Admitting Physician: Vianne Bulls, MD [id number], Merlyn Albert, MD  Admit Date: 10/06/2022 Discharge date: 10/13/2022  Recommendations for Outpatient Follow-up:  Follow up with PCP in 1-2 weeks Please obtain CMP/CBC in one week Renal MRI in 3 months to evaluate 9 mm right kidney lesion. GYN follow-up for bilateral ovarian cysts Please resume nifedipine when able  Admitted From:  Home  Disposition: Home   Discharge Condition: fair  CODE STATUS:   Code Status: Full Code   Diet recommendation:  Diet Order             Diet - low sodium heart healthy           Diet renal/carb modified with fluid restriction Diet-HS Snack? Nothing; Fluid restriction: 1200 mL Fluid; Room service appropriate? Yes; Fluid consistency: Thin  Diet effective now                    Brief Summary: Patient is a 52 y.o.  female with history of ESRD on HD MWF, HTN, DM-2, sickle cell disease, HLD-presented with left flank pain (second ED visit)-thought to have pyelonephritis and subsequently admitted to the hospitalist service.   Significant events: 1/14>> presented to [facility] with left flank pain-manage medically-discharge home. 1/17>> presented to [facility]-worsening left flank pain-urine culture on 1/14 positive for E. coli-admit to [facility].     Significant studies: 1/14>> CT abdomen/pelvis: 3.8 cm left and 3.7 cm right ovarian cyst.  Bilateral atrophic kidneys. 1/17>> CT angio chest: No PE, bibasilar atelectatic changes. 1/20>> CT abdomen/pelvis: No acute findings in the abdomen.  Nodular consolidative opacity left lower lobe-likely atelectatic-PNA not excluded. 1/22>> CXR: Mild bibasilar subsegmental atelectasis-unchanged-no acute airspace disease.   Significant microbiology data: 1/14>> urine culture: E. coli   Procedures: None   Consults: Nephrology  Brief Hospital Course: Severe left  flank pain-likely due to pyelonephritis involving left kidney-and now due to developing PNA at left lung base  Overall improved-pain is significantly less intense per patient and controlled on her usual narcotic regimen of oxycodone Will complete 7 days of Rocephin today-completed 3 days of Zithromax-will continue for 2 additional days to complete 5 days of treatment Continue bronchodilators on discharge Afebrile-on room air-and ambulating around the unit independently  ESRD on HD MWF Nephrology followed closely during this hospitalization.   Malfunctioning tunneled HD catheter Catheter exchanged by IR on 1/19-underwent HD without any issues post catheter exchange.   Hyperkalemia Resolved with HD   Normocytic anemia Due to combination of ESRD and sickle cell disease Significant drop in hemoglobin on 1/22-s/p transfusion while on dialysis.  CBC stable posttransfusion.   HTN BP still soft but stable Nifedipine on hold.  Resume when able BP stable on Coreg.   HLD Continue statin   Sickle cell disease Feel that the crisis was developing a couple of days ago-currently stable/improved Encourage hydration Continue narcotics    Bilateral ovarian cyst Seen incidentally on CT abdomen on 1/14 Likely not the cause for her left flank pain Outpatient follow-up with PCP/GYN   9 mm right kidney lesion Radiology recommending repeat MRI in 3 months to ensure stability.   Obesity: Estimated body mass index is 31.01 kg/m as calculated from the following:   Height as of this encounter: '5\' 2"'$  (1.575 m).   Weight as of this encounter: 76.9 kg.   Discharge Diagnoses:  Principal Problem:   Intractable pain Active Problems:  Type 2 diabetes mellitus with other diabetic kidney complication (HCC)   ESRD on hemodialysis (HCC)   Sickle cell disease without crisis ([place])   Anemia in chronic kidney disease   Hyperlipidemia   Discharge Instructions:  Activity:  As tolerated    Discharge  Instructions     Diet - low sodium heart healthy   Complete by: As directed    Discharge instructions   Complete by: As directed    Follow with Primary MD  Merlinda Frederick, MD in 1-2 weeks  Your blood pressure medications were adjusted-you are on a new dose of Coreg.  Nifedipine has been discontinued.  Please talk with your primary care practitioner/primary nephrologist before resuming nifedipine  You need outpatient follow-up with GYN for evaluation of bilateral ovarian cysts  Please let your primary care practitioner know that you need a repeat MRI of your right kidney to assess for right renal lesion.  Please get a complete blood count and chemistry panel checked by your Primary MD at your next visit, and again as instructed by your Primary MD.  Get Medicines reviewed and adjusted: Please take all your medications with you for your next visit with your Primary MD  Laboratory/radiological data: Please request your Primary MD to go over all hospital tests and procedure/radiological results at the follow up, please ask your Primary MD to get all Hospital records sent to his/her office.  In some cases, they will be blood work, cultures and biopsy results pending at the time of your discharge. Please request that your primary care M.D. follows up on these results.  Also Note the following: If you experience worsening of your admission symptoms, develop shortness of breath, life threatening emergency, suicidal or homicidal thoughts you must seek medical attention immediately by calling 911 or calling your MD immediately  if symptoms less severe.  You must read complete instructions/literature along with all the possible adverse reactions/side effects for all the Medicines you take and that have been prescribed to you. Take any new Medicines after you have completely understood and accpet all the possible adverse reactions/side effects.   Do not drive when taking Pain medications or sleeping  medications (Benzodaizepines)  Do not take more than prescribed Pain, Sleep and Anxiety Medications. It is not advisable to combine anxiety,sleep and pain medications without talking with your primary care practitioner  Special Instructions: If you have smoked or chewed Tobacco  in the last 2 yrs please stop smoking, stop any regular Alcohol  and or any Recreational drug use.  Wear Seat belts while driving.  Please note: You were cared for by a hospitalist during your hospital stay. Once you are discharged, your primary care physician will handle any further medical issues. Please note that NO REFILLS for any discharge medications will be authorized once you are discharged, as it is imperative that you return to your primary care physician (or establish a relationship with a primary care physician if you do not have one) for your post hospital discharge needs so that they can reassess your need for medications and monitor your lab values.   Increase activity slowly   Complete by: As directed    No dressing needed   Complete by: As directed       Allergies as of 10/13/2022       Reactions   Levofloxacin Anaphylaxis   Ciprofloxacin Swelling   Levaquin [levofloxacin In D5w] Hives, Swelling   Other Swelling   Unnamed med caused swelling of the lips (  patient cannot recall the indication or name)        Medication List     STOP taking these medications    NIFEdipine 30 MG 24 hr tablet Commonly known as: ADALAT CC       TAKE these medications    acetaminophen 500 MG tablet Commonly known as: TYLENOL Take 500-1,000 mg by mouth every 6 (six) hours as needed for mild pain or headache.   albuterol 108 (90 Base) MCG/ACT inhaler Commonly known as: VENTOLIN HFA Inhale 2 puffs into the lungs every 4 (four) hours as needed for wheezing or shortness of breath. What changed: when to take this   atorvastatin 40 MG tablet Commonly known as: LIPITOR Take 40 mg by mouth daily.    azithromycin 500 MG tablet Commonly known as: ZITHROMAX Take 1 tablet (500 mg total) by mouth daily.   benzonatate 200 MG capsule Commonly known as: TESSALON Take 1 capsule (200 mg total) by mouth 3 (three) times daily as needed for cough.   carvedilol 6.25 MG tablet Commonly known as: COREG Take 0.5 tablets (3.125 mg total) by mouth 2 (two) times daily with a meal. What changed:  medication strength how much to take   Cholecalciferol 50 MCG (2000 UT) Caps Take 2,000 Units by mouth daily.   cinacalcet 30 MG tablet Commonly known as: SENSIPAR Take 30 mg by mouth daily.   cyclobenzaprine 5 MG tablet Commonly known as: FLEXERIL Take 1 tablet (5 mg total) by mouth 3 (three) times daily as needed for muscle spasms.   DIALYVITE TABLET Tabs Take 1 tablet by mouth daily.   hydroxyurea 500 MG capsule Commonly known as: HYDREA Take 500 mg by mouth daily. May take with food to minimize GI side effects.   lidocaine-prilocaine cream Commonly known as: EMLA Apply 1 Application topically 2 (two) times a week.   Oxycodone HCl 10 MG Tabs Take 10 mg by mouth every 6 (six) hours as needed (pain).   sevelamer carbonate 800 MG tablet Commonly known as: RENVELA Take 1,600 mg by mouth 3 (three) times daily with meals.   Veltassa 8.4 g packet Generic drug: patiromer Take 8.4 g by mouth See admin instructions. Take on 'Sunday and Tuesday.               Discharge Care Instructions  (From admission, onward)           Start     Ordered   10/13/22 0000  No dressing needed        01'$ /24/24 1040            Follow-up Information     Merlinda Frederick, MD. Schedule an appointment as soon as possible for a visit in 1 week(s).   Specialty: Pediatrics        Hemodialysis unit Follow up.   Why: Follow-up at your usual schedule.               Allergies  Allergen Reactions   Levofloxacin Anaphylaxis   Ciprofloxacin Swelling   Levaquin [Levofloxacin In D5w] Hives  and Swelling   Other Swelling    Unnamed med caused swelling of the lips (patient cannot recall the indication or name)     Other Procedures/Studies: DG Chest Port 1 View  Result Date: 10/12/2022 CLINICAL DATA:  Shortness of breath. EXAM: PORTABLE CHEST 1 VIEW COMPARISON:  AP chest 10/06/2022 FINDINGS: Redemonstration of right internal jugular dual-lumen central venous catheter with tip overlying the superior vena cava/right atrial junction. Cardiac silhouette  is again at the upper limits of normal size. Mediastinal contours are within normal limits. Mild bibasilar linear subsegmental atelectasis, unchanged. No pleural effusion or pneumothorax. Mild multilevel degenerative disc changes of the thoracic spine. IMPRESSION: Mild bibasilar subsegmental atelectasis, unchanged. No acute airspace disease. Electronically Signed   By: Yvonne Kendall M.D.   On: 10/12/2022 08:18   CT ABDOMEN W CONTRAST  Result Date: 10/09/2022 CLINICAL DATA:  Left upper quadrant pain.  Pyelonephritis suspected. EXAM: CT ABDOMEN WITH CONTRAST TECHNIQUE: Multidetector CT imaging of the abdomen was performed using the standard protocol following bolus administration of intravenous contrast. RADIATION DOSE REDUCTION: This exam was performed according to the departmental dose-optimization program which includes automated exposure control, adjustment of the mA and/or kV according to patient size and/or use of iterative reconstruction technique. CONTRAST:  64m OMNIPAQUE IOHEXOL 350 MG/ML SOLN COMPARISON:  10/03/2022 FINDINGS: Lower chest: Nodular consolidative opacity in the left lower lobe is new in the interval. Likely atelectatic, pneumonia is not excluded. Given the rapid interval appearance, neoplasm is considered unlikely. Hepatobiliary: No suspicious focal abnormality within the liver parenchyma. Gallbladder is decompressed. No intrahepatic or extrahepatic biliary dilation. Pancreas: No focal mass lesion. No dilatation of the main  duct. No intraparenchymal cyst. No peripancreatic edema. Spleen: No splenomegaly. No focal mass lesion. Adrenals/Urinary Tract: No adrenal nodule or mass. Both kidneys are atrophic with multiple cystic lesions bilaterally, similar to prior. 9 mm lesion interpolar right kidney on 30/3 has attenuation too high to be a simple cyst. This was not definitely seen on MRI 08/06/2016. 11 mm interpolar left renal lesion also with attenuation too high to be a simple cyst. This had imaging features compatible with a cyst on MRI 08/06/2016. Stomach/Bowel: Stomach is distended with food material. Duodenum is normally positioned as is the ligament of Treitz. Insert abdominal bowel Vascular/Lymphatic: There is mild atherosclerotic calcification of the abdominal aorta without aneurysm. There is no gastrohepatic or hepatoduodenal ligament lymphadenopathy. No retroperitoneal or mesenteric lymphadenopathy. Other: No intraperitoneal free fluid. Musculoskeletal: No worrisome lytic or sclerotic osseous abnormality. Gas in the subcutaneous fat of the left anterior abdominal wall is likely from injection. IMPRESSION: 1. No acute findings in the abdomen. Specifically, no findings to explain the patient's history of left upper quadrant pain. 2. Nodular consolidative opacity in the left lower lobe is new in the interval. Likely atelectatic, pneumonia is not excluded. Given the rapid interval appearance, neoplasm is considered unlikely. 3. 9 mm lesion interpolar right kidney has attenuation too high to be a simple cyst. This was not definitely seen on MRI 08/06/2016. While this may be a cyst complicated by proteinaceous debris or hemorrhage, neoplasm is not entirely excluded. Follow-up abdominal MRI with and without contrast in 3 months recommended to ensure stability. 4. Bilateral renal atrophy. 5.  Aortic Atherosclerosis (ICD10-I70.0). Electronically Signed   By: EMisty StanleyM.D.   On: 10/09/2022 14:44   IR Fluoro Guide CV Line  Right  Result Date: 10/08/2022 INDICATION: 52year old with end-stage renal disease. Poorly functioning tunneled dialysis catheter. Patient presents for catheter exchange. EXAM: FLUOROSCOPIC AND ULTRASOUND GUIDED PLACEMENT OF A TUNNELED DIALYSIS CATHETER Physician: AStephan Minister Henn, MD MEDICATIONS: Ancef 2 g ANESTHESIA/SEDATION: 1% lidocaine for local anesthetic FLUOROSCOPY: Radiation Exposure Index (as provided by the fluoroscopic device): 8 mGy Kerma COMPLICATIONS: None immediate. PROCEDURE: The procedure was explained to the patient. The risks and benefits of the procedure were discussed and the patient's questions were addressed. Informed consent was obtained from the patient. Patient was placed supine on  the interventional table. The right chest and existing catheter were prepped and draped in sterile fashion. Maximal barrier sterile technique was utilized including caps, mask, sterile gowns, sterile gloves, sterile drape, hand hygiene and skin antiseptic. Fluoroscopy demonstrated that the existing catheter was well within the right atrium. The catheter cuff was easily exposed with mild traction. Skin around the catheter exit site was anesthetized using 1% lidocaine. Catheter was pulled back to the superior cavoatrial junction and both lumens aspirated and flushed well. However, the catheter cuff was exposed at this location. The existing catheter was removed over a stiff Glidewire and a new 19 cm Palindrome dialysis catheter was easily advanced over the wire. The cuff was placed underneath the skin. Catheter tip at the superior cavoatrial junction. Both lumens aspirated and flushed well. 1.6 mL heparin (1000 units/ml) was placed in both lumens. Catheter was secured to skin with suture. Both lumens were capped and clamped at the end of the procedure. Dressing was placed. Fluoroscopic images were taken and saved for this procedure. FINDINGS: New 19 cm tip to cuff Palindrome catheter was placed. Catheter tip at the  superior cavoatrial junction. IMPRESSION: Successful exchange of the tunneled dialysis catheter with fluoroscopy. Electronically Signed   By: Markus Daft M.D.   On: 10/08/2022 11:26   DG Chest 2 View  Result Date: 10/06/2022 CLINICAL DATA:  Cough EXAM: CHEST - 2 VIEW COMPARISON:  10/05/2022 FINDINGS: Cardiac shadow is stable. Right jugular dialysis catheter is noted and stable. Lungs are well aerated bilaterally. Minimal basilar atelectasis is seen. No bony abnormality is noted. IMPRESSION: Mild bibasilar atelectatic changes. Electronically Signed   By: Inez Catalina M.D.   On: 10/06/2022 21:01   CT Angio Chest PE W/Cm &/Or Wo Cm  Result Date: 10/06/2022 CLINICAL DATA:  Shortness of breath for 3 days, history of sickle cell disease and end-stage renal failure EXAM: CT ANGIOGRAPHY CHEST WITH CONTRAST TECHNIQUE: Multidetector CT imaging of the chest was performed using the standard protocol during bolus administration of intravenous contrast. Multiplanar CT image reconstructions and MIPs were obtained to evaluate the vascular anatomy. RADIATION DOSE REDUCTION: This exam was performed according to the departmental dose-optimization program which includes automated exposure control, adjustment of the mA and/or kV according to patient size and/or use of iterative reconstruction technique. CONTRAST:  29m OMNIPAQUE IOHEXOL 350 MG/ML SOLN COMPARISON:  Chest x-ray from 2 hours previous FINDINGS: Cardiovascular: Thoracic aorta and its branches demonstrate atherosclerotic calcifications. No aneurysmal dilatation or dissection is noted. No cardiac enlargement is seen. Mild coronary calcifications are noted. The pulmonary artery shows a normal branching pattern bilaterally. No filling defect to suggest pulmonary embolism is noted. Right jugular dialysis catheter is noted in satisfactory position. Multiple left upper extremity venous stents are seen. Mediastinum/Nodes: Thoracic inlet is within normal limits. No hilar or  mediastinal adenopathy is noted. The esophagus as visualized is within normal limits. Lungs/Pleura: Lungs are well aerated bilaterally. Mild basilar atelectatic changes are seen. These are stable from the prior plain film examination. No focal confluent infiltrate is noted. No effusion is seen. Upper Abdomen: Visualized upper abdomen shows no acute abnormality. Musculoskeletal: No chest wall abnormality. No acute or significant osseous findings. Review of the MIP images confirms the above findings. IMPRESSION: No evidence of pulmonary emboli. Bibasilar atelectatic changes. Aortic Atherosclerosis (ICD10-I70.0). Electronically Signed   By: MInez CatalinaM.D.   On: 10/06/2022 02:14   CT Abdomen Pelvis W Contrast  Result Date: 10/03/2022 CLINICAL DATA:  Abdominal/flank pain, stone suspected EXAM: CT  ABDOMEN AND PELVIS WITH CONTRAST TECHNIQUE: Multidetector CT imaging of the abdomen and pelvis was performed using the standard protocol following bolus administration of intravenous contrast. RADIATION DOSE REDUCTION: This exam was performed according to the departmental dose-optimization program which includes automated exposure control, adjustment of the mA and/or kV according to patient size and/or use of iterative reconstruction technique. CONTRAST:  137m OMNIPAQUE IOHEXOL 300 MG/ML  SOLN COMPARISON:  MRI abdomen 08/06/2016, CT abdomen pelvis 04/20/2009 report without imaging FINDINGS: Lower chest: No acute abnormality. Hepatobiliary: No focal liver abnormality. No gallstones, gallbladder wall thickening, or pericholecystic fluid. No biliary dilatation. Pancreas: No focal lesion. Normal pancreatic contour. No surrounding inflammatory changes. No main pancreatic ductal dilatation. Spleen: Normal in size without focal abnormality. Adrenals/Urinary Tract: No adrenal nodule bilaterally. Bilateral atrophic kidneys. Bilateral kidneys enhance symmetrically. Multiple fluid density lesions within the kidneys likely represent  simple renal cysts. Simple renal cysts, in the absence of clinically indicated signs/symptoms, require no independent follow-up. Subcentimeter hypodensities are too small to characterize-no further follow-up indicated. No hydronephrosis. No hydroureter. The urinary bladder is unremarkable. No excretion of intravenous contrast on delayed view. Stomach/Bowel: Stomach is within normal limits. No evidence of bowel wall thickening or dilatation. Appendix appears normal. Vascular/Lymphatic: No abdominal aorta or iliac aneurysm. Mild to moderate atherosclerotic plaque of the aorta and its branches. No abdominal, pelvic, or inguinal lymphadenopathy. Reproductive: Multiseptated 3.8 x 3.2 cm left ovarian cystic lesion. Multi septated 3.7 x 3 cm right ovarian cystic lesion that is better evaluated on coronal images. Uterus and bilateral adnexa are unremarkable. Other: No intraperitoneal free fluid. No intraperitoneal free gas. No organized fluid collection. Musculoskeletal: No abdominal wall hernia or abnormality. No suspicious lytic or blastic osseous lesions. No acute displaced fracture. Multilevel degenerative changes of the spine. IMPRESSION: 1. Multiseptated 3.8 cm left and 3.7 cm right ovarian cystic lesions. Recommend pelvic ultrasound for further evaluation. 2. Bilateral atrophic kidneys with no excretion of intravenous contrast on delayed view. Correlate with renal function. 3.  Aortic Atherosclerosis (ICD10-I70.0). Electronically Signed   By: MIven FinnM.D.   On: 10/03/2022 20:19   DG Chest 2 View  Result Date: 10/03/2022 CLINICAL DATA:  Chest pain and shortness of breath.  Dialysis. EXAM: CHEST - 2 VIEW COMPARISON:  06/20/2016 FINDINGS: The heart size and mediastinal contours are within normal limits. Right jugular dual-lumen central venous dialysis catheter is seen in appropriate position. Evidence of pneumothorax. Low lung volumes are seen with mild bibasilar atelectasis. No evidence of pulmonary  infiltrate or pleural effusion. IMPRESSION: Low lung volumes with mild bibasilar atelectasis. Electronically Signed   By: JMarlaine HindM.D.   On: 10/03/2022 17:57     TODAY-DAY OF DISCHARGE:  Subjective:   TLovenia Kimtoday has no headache,no chest abdominal pain,no new weakness tingling or numbness, feels much better wants to go home today.  Objective:   Blood pressure 111/68, pulse 94, temperature 98.2 F (36.8 C), temperature source Oral, resp. rate (!) 21, height '5\' 2"'$  (1.575 m), weight 79.5 kg, last menstrual period 09/14/2022, SpO2 100 %.  Intake/Output Summary (Last 24 hours) at 10/13/2022 1041 Last data filed at 10/13/2022 0753 Gross per 24 hour  Intake 880 ml  Output 0 ml  Net 880 ml   Filed Weights   10/11/22 1300 10/13/22 0529 10/13/22 0831  Weight: 76.9 kg 78.6 kg 79.5 kg    Exam: Awake Alert, Oriented *3, No new F.N deficits, Normal affect [place].AT,PERRAL Supple Neck,No JVD, No cervical lymphadenopathy appriciated.  Symmetrical Chest wall  movement, Good air movement bilaterally, CTAB RRR,No Gallops,Rubs or new Murmurs, No Parasternal Heave +ve B.Sounds, Abd Soft, Non tender, No organomegaly appriciated, No rebound -guarding or rigidity. No Cyanosis, Clubbing or edema, No new Rash or bruise   PERTINENT RADIOLOGIC STUDIES: DG Chest Port 1 View  Result Date: 10/12/2022 CLINICAL DATA:  Shortness of breath. EXAM: PORTABLE CHEST 1 VIEW COMPARISON:  AP chest 10/06/2022 FINDINGS: Redemonstration of right internal jugular dual-lumen central venous catheter with tip overlying the superior vena cava/right atrial junction. Cardiac silhouette is again at the upper limits of normal size. Mediastinal contours are within normal limits. Mild bibasilar linear subsegmental atelectasis, unchanged. No pleural effusion or pneumothorax. Mild multilevel degenerative disc changes of the thoracic spine. IMPRESSION: Mild bibasilar subsegmental atelectasis, unchanged. No acute airspace disease.  Electronically Signed   By: Yvonne Kendall M.D.   On: 10/12/2022 08:18     PERTINENT LAB RESULTS: CBC: Recent Labs    10/11/22 0847 10/12/22 0651  WBC 12.9* 13.2*  HGB 6.6* 8.4*  HCT 18.9* 23.2*  PLT 387 389   CMET CMP     Component Value Date/Time   NA 133 (L) 10/11/2022 0847   K 4.3 10/11/2022 0847   CL 94 (L) 10/11/2022 0847   CO2 25 10/11/2022 0847   GLUCOSE 111 (H) 10/11/2022 0847   BUN 41 (H) 10/11/2022 0847   CREATININE 9.98 (H) 10/11/2022 0847   CALCIUM 7.9 (L) 10/11/2022 0847   PROT 8.1 10/06/2022 0131   ALBUMIN 2.8 (L) 10/11/2022 0847   AST 26 10/06/2022 0131   ALT 18 10/06/2022 0131   ALKPHOS 100 10/06/2022 0131   BILITOT 0.5 10/06/2022 0131   GFRNONAA 4 (L) 10/11/2022 0847   GFRAA 19 (L) 06/24/2016 0535    GFR Estimated Creatinine Clearance: 6.4 mL/min (A) (by C-G formula based on SCr of 9.98 mg/dL (H)). No results for input(s): "LIPASE", "AMYLASE" in the last 72 hours. No results for input(s): "CKTOTAL", "CKMB", "CKMBINDEX", "TROPONINI" in the last 72 hours. Invalid input(s): "POCBNP" No results for input(s): "DDIMER" in the last 72 hours. No results for input(s): "HGBA1C" in the last 72 hours. No results for input(s): "CHOL", "HDL", "LDLCALC", "TRIG", "CHOLHDL", "LDLDIRECT" in the last 72 hours. No results for input(s): "TSH", "T4TOTAL", "T3FREE", "THYROIDAB" in the last 72 hours.  Invalid input(s): "FREET3" No results for input(s): "VITAMINB12", "FOLATE", "FERRITIN", "TIBC", "IRON", "RETICCTPCT" in the last 72 hours. Coags: No results for input(s): "INR" in the last 72 hours.  Invalid input(s): "PT" Microbiology: Recent Results (from the past 240 hour(s))  Resp panel by RT-PCR (RSV, Flu A&B, Covid) Nasopharyngeal Swab     Status: None   Collection Time: 10/03/22  3:38 PM   Specimen: Nasopharyngeal Swab; Nasal Swab  Result Value Ref Range Status   SARS Coronavirus 2 by RT PCR NEGATIVE NEGATIVE Final    Comment: (NOTE) SARS-CoV-2 target nucleic  acids are NOT DETECTED.  The SARS-CoV-2 RNA is generally detectable in upper respiratory specimens during the acute phase of infection. The lowest concentration of SARS-CoV-2 viral copies this assay can detect is 138 copies/mL. A negative result does not preclude SARS-Cov-2 infection and should not be used as the sole basis for treatment or other patient management decisions. A negative result may occur with  improper specimen collection/handling, submission of specimen other than nasopharyngeal swab, presence of viral mutation(s) within the areas targeted by this assay, and inadequate number of viral copies(<138 copies/mL). A negative result must be combined with clinical observations, patient history, and epidemiological  information. The expected result is Negative.  Fact Sheet for Patients:  EntrepreneurPulse.com.au  Fact Sheet for Healthcare Providers:  IncredibleEmployment.be  This test is no t yet approved or cleared by the [place] FDA and  has been authorized for detection and/or diagnosis of SARS-CoV-2 by FDA under an Emergency Use Authorization (EUA). This EUA will remain  in effect (meaning this test can be used) for the duration of the COVID-19 declaration under Section 564(b)(1) of the Act, 21 U.S.C.section 360bbb-3(b)(1), unless the authorization is terminated  or revoked sooner.       Influenza A by PCR NEGATIVE NEGATIVE Final   Influenza B by PCR NEGATIVE NEGATIVE Final    Comment: (NOTE) The Xpert Xpress SARS-CoV-2/FLU/RSV plus assay is intended as an aid in the diagnosis of influenza from Nasopharyngeal swab specimens and should not be used as a sole basis for treatment. Nasal washings and aspirates are unacceptable for Xpert Xpress SARS-CoV-2/FLU/RSV testing.  Fact Sheet for Patients: EntrepreneurPulse.com.au  Fact Sheet for Healthcare Providers: IncredibleEmployment.be  This  test is not yet approved or cleared by the [place] FDA and has been authorized for detection and/or diagnosis of SARS-CoV-2 by FDA under an Emergency Use Authorization (EUA). This EUA will remain in effect (meaning this test can be used) for the duration of the COVID-19 declaration under Section 564(b)(1) of the Act, 21 U.S.C. section 360bbb-3(b)(1), unless the authorization is terminated or revoked.     Resp Syncytial Virus by PCR NEGATIVE NEGATIVE Final    Comment: (NOTE) Fact Sheet for Patients: EntrepreneurPulse.com.au  Fact Sheet for Healthcare Providers: IncredibleEmployment.be  This test is not yet approved or cleared by the [place] FDA and has been authorized for detection and/or diagnosis of SARS-CoV-2 by FDA under an Emergency Use Authorization (EUA). This EUA will remain in effect (meaning this test can be used) for the duration of the COVID-19 declaration under Section 564(b)(1) of the Act, 21 U.S.C. section 360bbb-3(b)(1), unless the authorization is terminated or revoked.  Performed at [facility], [address] [place], [place] [address]   Urine Culture     Status: Abnormal   Collection Time: 10/03/22 10:08 PM   Specimen: Urine, Clean Catch  Result Value Ref Range Status   Specimen Description   Final    URINE, CLEAN CATCH Performed at [facility], [address] [place], [place] [address]    Special Requests   Final    NONE Performed at [facility], [address] [place], [place] [address]    Culture >=100,000 COLONIES/mL ESCHERICHIA COLI (A)  Final   Report Status 10/06/2022 FINAL  Final   Organism ID, Bacteria ESCHERICHIA COLI (A)  Final      Susceptibility   Escherichia coli - MIC*    AMPICILLIN <=2 SENSITIVE Sensitive     CEFAZOLIN <=4 SENSITIVE Sensitive     CEFEPIME <=0.12 SENSITIVE Sensitive     CEFTRIAXONE <=0.25 SENSITIVE Sensitive     CIPROFLOXACIN <=0.25  SENSITIVE Sensitive     GENTAMICIN <=1 SENSITIVE Sensitive     IMIPENEM <=0.25 SENSITIVE Sensitive     NITROFURANTOIN <=16 SENSITIVE Sensitive     TRIMETH/SULFA <=20 SENSITIVE Sensitive     AMPICILLIN/SULBACTAM <=2 SENSITIVE Sensitive     PIP/TAZO <=4 SENSITIVE Sensitive     * >=100,000 COLONIES/mL ESCHERICHIA COLI    FURTHER DISCHARGE INSTRUCTIONS:  Get Medicines reviewed and adjusted: Please take all your medications with you for your next visit with  your Primary MD  Laboratory/radiological data: Please request your Primary MD to go over all hospital tests and procedure/radiological results at the follow up, please ask your Primary MD to get all Hospital records sent to his/her office.  In some cases, they will be blood work, cultures and biopsy results pending at the time of your discharge. Please request that your primary care M.D. goes through all the records of your hospital data and follows up on these results.  Also Note the following: If you experience worsening of your admission symptoms, develop shortness of breath, life threatening emergency, suicidal or homicidal thoughts you must seek medical attention immediately by calling 911 or calling your MD immediately  if symptoms less severe.  You must read complete instructions/literature along with all the possible adverse reactions/side effects for all the Medicines you take and that have been prescribed to you. Take any new Medicines after you have completely understood and accpet all the possible adverse reactions/side effects.   Do not drive when taking Pain medications or sleeping medications (Benzodaizepines)  Do not take more than prescribed Pain, Sleep and Anxiety Medications. It is not advisable to combine anxiety,sleep and pain medications without talking with your primary care practitioner  Special Instructions: If you have smoked or chewed Tobacco  in the last 2 yrs please stop smoking, stop any regular Alcohol  and  or any Recreational drug use.  Wear Seat belts while driving.  Please note: You were cared for by a hospitalist during your hospital stay. Once you are discharged, your primary care physician will handle any further medical issues. Please note that NO REFILLS for any discharge medications will be authorized once you are discharged, as it is imperative that you return to your primary care physician (or establish a relationship with a primary care physician if you do not have one) for your post hospital discharge needs so that they can reassess your need for medications and monitor your lab values.  Total Time spent coordinating discharge including counseling, education and face to face time equals greater than 30 minutes.  SignedOren Binet 10/13/2022 10:41 AM

## 2022-10-14 ENCOUNTER — Telehealth: Payer: Self-pay | Admitting: Nephrology

## 2022-10-14 NOTE — Telephone Encounter (Signed)
Transition of care contact from inpatient facility  Date of discharge: 10/13/22 Date of contact: 10/14/22 Method: Phone Spoke to: Patient  Patient contacted to discuss transition of care from recent inpatient hospitalization. Patient was admitted to [facility] from 1/17- 10/13/22 with discharge diagnosis of left sided pyelonephritis and PNA  Medication changes were reviewed. No questions.   Patient will follow up with his/her outpatient HD unit on: Friday 1/26

## 2022-10-15 ENCOUNTER — Other Ambulatory Visit: Payer: Self-pay

## 2022-10-15 ENCOUNTER — Emergency Department (HOSPITAL_BASED_OUTPATIENT_CLINIC_OR_DEPARTMENT_OTHER)
Admission: EM | Admit: 2022-10-15 | Discharge: 2022-10-15 | Disposition: A | Discharge status: Home / Self Care | Payer: Medicare PPO | Attending: Emergency Medicine | Admitting: Emergency Medicine

## 2022-10-15 ENCOUNTER — Encounter (HOSPITAL_BASED_OUTPATIENT_CLINIC_OR_DEPARTMENT_OTHER): Payer: Self-pay | Admitting: Emergency Medicine

## 2022-10-15 DIAGNOSIS — R220 Localized swelling, mass and lump, head: Secondary | ICD-10-CM | POA: Diagnosis present

## 2022-10-15 DIAGNOSIS — T7840XA Allergy, unspecified, initial encounter: Secondary | ICD-10-CM | POA: Diagnosis not present

## 2022-10-15 DIAGNOSIS — Z79899 Other long term (current) drug therapy: Secondary | ICD-10-CM | POA: Diagnosis not present

## 2022-10-15 DIAGNOSIS — N186 End stage renal disease: Secondary | ICD-10-CM | POA: Diagnosis not present

## 2022-10-15 DIAGNOSIS — E119 Type 2 diabetes mellitus without complications: Secondary | ICD-10-CM | POA: Diagnosis not present

## 2022-10-15 DIAGNOSIS — R062 Wheezing: Secondary | ICD-10-CM | POA: Diagnosis not present

## 2022-10-15 MED ORDER — ALBUTEROL SULFATE HFA 108 (90 BASE) MCG/ACT IN AERS
2.0000 | INHALATION_SPRAY | Freq: Once | RESPIRATORY_TRACT | Status: AC
  Administered 2022-10-15: 2 via RESPIRATORY_TRACT
  Filled 2022-10-15: qty 6.7

## 2022-10-15 MED ORDER — PREDNISONE 50 MG PO TABS: ORAL_TABLET | ORAL | 0 refills | Status: AC

## 2022-10-15 MED ORDER — DIPHENHYDRAMINE HCL 25 MG PO CAPS
50.0000 mg | ORAL_CAPSULE | Freq: Once | ORAL | Status: AC
  Administered 2022-10-15: 50 mg via ORAL
  Filled 2022-10-15: qty 2

## 2022-10-15 MED ORDER — EPINEPHRINE 0.3 MG/0.3ML IJ SOAJ: 0.3000 mg | INTRAMUSCULAR | 0 refills | Status: AC | PRN

## 2022-10-15 MED ORDER — DEXAMETHASONE SODIUM PHOSPHATE 10 MG/ML IJ SOLN
10.0000 mg | Freq: Once | INTRAMUSCULAR | Status: AC
  Administered 2022-10-15: 10 mg via INTRAMUSCULAR
  Filled 2022-10-15: qty 1

## 2022-10-15 NOTE — ED Triage Notes (Signed)
Pt states she was discharged from the hospital on Wednesday  Pt started taking azithromycin and cyclobenzaprine  Pt states on Thursday she started having itching, hives and lip swelling  No breathing difficulty  Pt states she has been using benadryl without relief

## 2022-10-15 NOTE — ED Provider Notes (Signed)
[facility] Provider Note   CSN: [id number] Arrival date & time: 10/15/22  0246     History  Chief Complaint  Patient presents with   Allergic Reaction    Julie Wells is a 52 y.o. female.  The history is provided by the patient and the spouse.  Patient presents for allergic reaction that might be caused by azithromycin Patient was recently admitted to hospital for pyelonephritis and also likely pneumonia.  She was discharged home on azithromycin, and over the past days she has had itching and lip swelling.  No fevers, no vomiting or diarrhea.  No chest pain.  She has had cough that is improving.  No significant shortness of breath.  She has  taken Benadryl without relief.  The itching worsened at night so she came in for evaluation.  No new tongue swelling.  The lip swelling has remained stable.  No difficulty swallowing     Home Medications Prior to Admission medications   Medication Sig Start Date End Date Taking? Authorizing Provider  EPINEPHrine 0.3 mg/0.3 mL IJ SOAJ injection Inject 0.3 mg into the muscle as needed for anaphylaxis. 10/15/22  Yes Ripley Fraise, MD  predniSONE (DELTASONE) 50 MG tablet 1 tablet PO QD X4 days 10/15/22  Yes Ripley Fraise, MD  acetaminophen (TYLENOL) 500 MG tablet Take 500-1,000 mg by mouth every 6 (six) hours as needed for mild pain or headache.     [provider]  albuterol (VENTOLIN HFA) 108 (90 Base) MCG/ACT inhaler Inhale 2 puffs into the lungs every 4 (four) hours as needed for wheezing or shortness of breath. 10/13/22   Ghimire, Henreitta Leber, MD  atorvastatin (LIPITOR) 40 MG tablet Take 40 mg by mouth daily. 07/03/20   [provider]  B Complex-C-Folic Acid (DIALYVITE TABLET) TABS Take 1 tablet by mouth daily. 09/22/20   [provider]  carvedilol (COREG) 6.25 MG tablet Take 0.5 tablets (3.125 mg total) by mouth 2 (two) times daily with a meal. 10/13/22   Ghimire, Henreitta Leber, MD  Cholecalciferol 50 MCG (2000 UT) CAPS Take 2,000 Units by mouth daily. 04/08/22 04/08/23  [provider]  cinacalcet (SENSIPAR) 30 MG tablet Take 30 mg by mouth daily.    [provider]  cyclobenzaprine (FLEXERIL) 5 MG tablet Take 1 tablet (5 mg total) by mouth 3 (three) times daily as needed for muscle spasms. 10/13/22   Ghimire, Henreitta Leber, MD  hydroxyurea (HYDREA) 500 MG capsule Take 500 mg by mouth daily. May take with food to minimize GI side effects.    [provider]  lidocaine-prilocaine (EMLA) cream Apply 1 Application topically 2 (two) times a week. 01/21/21   [provider]  Oxycodone HCl 10 MG TABS Take 10 mg by mouth every 6 (six) hours as needed (pain). 09/17/22   [provider]  patiromer (VELTASSA) 8.4 g packet Take 8.4 g by mouth See admin instructions. Take on Sunday and Tuesday. 10/19/21   [provider]  sevelamer carbonate (RENVELA) 800 MG tablet Take 1,600 mg by mouth 3 (three) times daily with meals. 08/27/20   [provider]      Allergies    Levofloxacin, Azithromycin, Ciprofloxacin, Levaquin [levofloxacin in d5w], and Other    Review of Systems   Review of Systems  Constitutional:  Negative for fever.  Respiratory:  Positive for cough.     Physical Exam Updated Vital Signs BP 112/70   Pulse 89   Temp  98.6 F (37 C) (Oral)   Resp 16   Ht 1.575 m ('5\' 2"'$ )   Wt 75.8 kg   LMP 09/14/2022   SpO2 100%   BMI 30.54 kg/m  Physical Exam CONSTITUTIONAL: Well developed/well nourished no acute distress HEAD: Normocephalic/atraumatic EYES: EOMI/PERRL ENMT: Mucous membranes moist left upper and lower left eyelid edematous.  There is no tongue swelling, uvula is midline, no stridor, no drooling NECK: supple no meningeal signs CV: S1/S2 noted LUNGS: Brief crackles in left base, no wheezing, coughing throughout exam ABDOMEN: soft, nontender NEURO: Pt is awake/alert/appropriate, moves all  extremitiesx4.  No facial droop.   SKIN: warm, color normal, urticaria noted to extremities PSYCH: no abnormalities of mood noted, alert and oriented to situation  ED Results / Procedures / Treatments   Labs (all labs ordered are listed, but only abnormal results are displayed) Labs Reviewed - No data to display  EKG None  Radiology No results found.  Procedures Procedures    Medications Ordered in ED Medications  dexamethasone (DECADRON) injection 10 mg (10 mg Intramuscular Given 10/15/22 0314)  diphenhydrAMINE (BENADRYL) capsule 50 mg (50 mg Oral Given 10/15/22 0314)  albuterol (VENTOLIN HFA) 108 (90 Base) MCG/ACT inhaler 2 puff (2 puffs Inhalation Given 10/15/22 0328)    ED Course/ Medical Decision Making/ A&P Clinical Course as of 10/15/22 0406  Fri Oct 15, 2022  0405 Patient improved.  No worsening of the angioedema.  She is in no acute distress.  On repeat exam, she does have minimal bilateral wheeze.  No crackles.  She will stop her antibiotics.  She will be given albuterol, will add on prednisone for her allergic reaction.  She will also be given an EpiPen in case her symptoms worsen.  At this time, given the slow nature of her illness, I do not feel that she is in anaphylaxis at this time [DW]    Clinical Course User Index [DW] Ripley Fraise, MD                             Medical Decision Making Risk Prescription drug management.   Patient with history of ESRD, sickle cell, diabetes presents with allergic reaction.  This may be caused by azithromycin as it is the only new medicine she has been placed on.  She reports slowly worsening itching and mild lip swelling.  There is no tongue involvement.  There is no stridor or drooling.  It is not acutely worsened tonight. Will treat symptoms and reassess.  She also plans to stop her cough medicine, therefore we will give her an albuterol inhaler        Final Clinical Impression(s) / ED Diagnoses Final diagnoses:   Allergic reaction, initial encounter    Rx / DC Orders ED Discharge Orders          Ordered    predniSONE (DELTASONE) 50 MG tablet        10/15/22 0404    EPINEPHrine 0.3 mg/0.3 mL IJ SOAJ injection  As needed        10/15/22 0404              Ripley Fraise, MD 10/15/22 0406

## 2022-10-19 ENCOUNTER — Ambulatory Visit (INDEPENDENT_AMBULATORY_CARE_PROVIDER_SITE_OTHER)
Admission: RE | Admit: 2022-10-19 | Discharge: 2022-10-19 | Disposition: A | Discharge status: Home / Self Care | Payer: Medicare PPO | Source: Ambulatory Visit | Attending: Vascular Surgery | Admitting: Vascular Surgery

## 2022-10-19 ENCOUNTER — Ambulatory Visit (HOSPITAL_COMMUNITY)
Admission: RE | Admit: 2022-10-19 | Discharge: 2022-10-19 | Disposition: A | Discharge status: Home / Self Care | Payer: Medicare PPO | Source: Ambulatory Visit | Attending: Vascular Surgery | Admitting: Vascular Surgery

## 2022-10-19 ENCOUNTER — Encounter: Payer: Self-pay | Admitting: Vascular Surgery

## 2022-10-19 ENCOUNTER — Ambulatory Visit (INDEPENDENT_AMBULATORY_CARE_PROVIDER_SITE_OTHER): Payer: Medicare PPO | Admitting: Vascular Surgery

## 2022-10-19 VITALS — BP 128/79 | HR 85 | Temp 98.6°F | Resp 20 | Ht 62.0 in | Wt 166.0 lb

## 2022-10-19 DIAGNOSIS — Z992 Dependence on renal dialysis: Secondary | ICD-10-CM

## 2022-10-19 DIAGNOSIS — N186 End stage renal disease: Secondary | ICD-10-CM

## 2022-10-19 NOTE — Progress Notes (Signed)
[facility] [place]  ASSESSMENT / PLAN: 52 y.o. female with thrombosed left arm arteriovenous graft. I counseled her that we likely do not have further options in the left upper extremity and recommended a right brachiocephalic arteriovenous fistula. She is in agreement. Plan to do this on a non-dialysis day in the near future.   CHIEF COMPLAINT: thrombosed left arm graft  HISTORY OF PRESENT ILLNESS: Julie Wells is a 52 y.o. female with ESRD dialyzing via RIJ TDC. Her left arm arteriovenous graft has unfortunately thrombosed. She presents to clinic for evaluation of new permanent access.   Past Medical History:  Diagnosis Date   Chronic kidney disease    Frequent headaches    Hematuria 06/20/2016   High cholesterol    History of blood transfusion    multiple, no reactions   Hypertension    Migraine    " I constantly have my migraines" (06/23/2016)   Pneumonia    Short-term memory loss    /notes 06/23/2016   Sickle cell disease without crisis ([place]) 03/12/2020   Sickle cell trait ([place])    Stroke ([place]) 2010   short term memory loss/notes 06/23/2016   Type 2 diabetes, diet controlled ([place])    no meds, diet controlled    Past Surgical History:  Procedure Laterality Date   AV FISTULA PLACEMENT Left 12/04/2020   Procedure: LEFT BRACHOBASCILIC  ARTERIOVENOUS (AV) FISTULA CREATION;  Surgeon: [name], MD;  Location: [facility];  Service: Vascular;  Laterality: Left;  PERIPHERAL NERVE BLOCK   AV FISTULA PLACEMENT Left 12/25/2020   Procedure: LEFT UPPER ARM ARTERIOVENOUS LOOP GRAFT PLACEMENT;  Surgeon: [name], MD;  Location: [facility];  Service: Vascular;  Laterality: Left;   CESAREAN SECTION  X 2   COLONOSCOPY  2021   x 3   IR FLUORO GUIDE CV LINE RIGHT  10/08/2022   TUBAL LIGATION      History reviewed. No pertinent family history.  Social History   Socioeconomic History   Marital status: Married    Spouse name: Not on file   Number of children:  Not on file   Years of education: Not on file   Highest education level: Not on file  Occupational History   Not on file  Tobacco Use   Smoking status: Never   Smokeless tobacco: Never  Vaping Use   Vaping Use: Never used  Substance and Sexual Activity   Alcohol use: No   Drug use: No   Sexual activity: Not Currently    Birth control/protection: Surgical    Comment: Tubal Ligation  Other Topics Concern   Not on file  Social History Narrative   ** Merged History Encounter **       Social Determinants of Health   Financial Resource Strain: Not on file  Food Insecurity: No Food Insecurity (10/06/2022)   Hunger Vital Sign    Worried About Running Out of Food in the Last Year: Never true    Ran Out of Food in the Last Year: Never true  Transportation Needs: No Transportation Needs (10/06/2022)   PRAPARE - Hydrologist (Medical): No    Lack of Transportation (Non-Medical): No  Physical Activity: Not on file  Stress: Not on file  Social Connections: Not on file  Intimate Partner Violence: Not At Risk (10/07/2022)   Humiliation, Afraid, Rape, and Kick questionnaire    Fear of Current or Ex-Partner: No    Emotionally Abused: No  Physically Abused: No    Sexually Abused: No    Allergies  Allergen Reactions   Levofloxacin Anaphylaxis   Azithromycin Hives   Ciprofloxacin Swelling   Levaquin [Levofloxacin In D5w] Hives and Swelling   Other Swelling    Unnamed med caused swelling of the lips (patient cannot recall the indication or name)    Current Outpatient Medications  Medication Sig Dispense Refill   acetaminophen (TYLENOL) 500 MG tablet Take 500-1,000 mg by mouth every 6 (six) hours as needed for mild pain or headache.      albuterol (VENTOLIN HFA) 108 (90 Base) MCG/ACT inhaler Inhale 2 puffs into the lungs every 4 (four) hours as needed for wheezing or shortness of breath. 18 g 0   atorvastatin (LIPITOR) 40 MG tablet Take 40 mg by mouth  daily.     B Complex-C-Folic Acid (DIALYVITE TABLET) TABS Take 1 tablet by mouth daily.     carvedilol (COREG) 6.25 MG tablet Take 0.5 tablets (3.125 mg total) by mouth 2 (two) times daily with a meal. 30 tablet 0   Cholecalciferol 50 MCG (2000 UT) CAPS Take 2,000 Units by mouth daily.     cinacalcet (SENSIPAR) 30 MG tablet Take 30 mg by mouth daily.     cyclobenzaprine (FLEXERIL) 5 MG tablet Take 1 tablet (5 mg total) by mouth 3 (three) times daily as needed for muscle spasms. 20 tablet 0   EPINEPHrine 0.3 mg/0.3 mL IJ SOAJ injection Inject 0.3 mg into the muscle as needed for anaphylaxis. 1 each 0   hydroxyurea (HYDREA) 500 MG capsule Take 500 mg by mouth daily. May take with food to minimize GI side effects.     lidocaine-prilocaine (EMLA) cream Apply 1 Application topically 2 (two) times a week.     Oxycodone HCl 10 MG TABS Take 10 mg by mouth every 6 (six) hours as needed (pain).     patiromer (VELTASSA) 8.4 g packet Take 8.4 g by mouth See admin instructions. Take on Sunday and Tuesday.     predniSONE (DELTASONE) 50 MG tablet 1 tablet PO QD X4 days 4 tablet 0   sevelamer carbonate (RENVELA) 800 MG tablet Take 1,600 mg by mouth 3 (three) times daily with meals.     No current facility-administered medications for this visit.    PHYSICAL EXAM Vitals:   10/19/22 1257  BP: 128/79  Pulse: 85  Resp: 20  Temp: 98.6 F (37 C)  SpO2: 99%  Weight: 166 lb (75.3 kg)  Height: '5\' 2"'$  (1.575 m)   Well appearing woman No acute distress Regular rate and rhythm Unlabored breathing 2+ brachial pulses Thrombosed left arm graft    PERTINENT LABORATORY AND RADIOLOGIC DATA  Most recent CBC    Latest Ref Rng & Units 10/12/2022    6:51 AM 10/11/2022    8:47 AM 10/10/2022    7:34 AM  CBC  WBC 4.0 - 10.5 K/uL 13.2  12.9  15.0   Hemoglobin 12.0 - 15.0 g/dL 8.4  6.6  7.2   Hematocrit 36.0 - 46.0 % 23.2  18.9  20.6   Platelets 150 - 400 K/uL 389  387  353      Most recent CMP    Latest Ref  Rng & Units 10/11/2022    8:47 AM 10/10/2022    7:34 AM 10/08/2022    5:16 AM  CMP  Glucose 70 - 99 mg/dL 111  107  84   BUN 6 - 20 mg/dL 41  30  45  Creatinine 0.44 - 1.00 mg/dL 9.98  7.88  10.84   Sodium 135 - 145 mmol/L 133  133  134   Potassium 3.5 - 5.1 mmol/L 4.3  4.9  5.3   Chloride 98 - 111 mmol/L 94  96  95   CO2 22 - 32 mmol/L '25  25  23   '$ Calcium 8.9 - 10.3 mg/dL 7.9  7.9  7.3     Renal function Estimated Creatinine Clearance: 6.2 mL/min (A) (by C-G formula based on SCr of 9.98 mg/dL (H)).  Hgb A1c MFr Bld (%)  Date Value  06/20/2016 4.5 (L)    No results found for: "LDLCALC", "LDLC", "HIRISKLDL", "POCLDL", "LDLDIRECT", "REALLDLC", "TOTLDLC"   Major [place]ter[place] N. Stanford Breed, MD FACS [facility] Phone Number: [phone] 10/19/2022 8:33 PM   Total time spent on preparing this encounter including chart review, data review, collecting history, examining the patient, coordinating care for this established patient, 30 minutes.  Portions of this report may have been transcribed using voice recognition software.  Every effort has been made to ensure accuracy; however, inadvertent computerized transcription errors may still be present.

## 2022-10-19 NOTE — H&P (View-Only) (Signed)
[facility] [place]  ASSESSMENT / PLAN: 52 y.o. female with thrombosed left arm arteriovenous graft. I counseled her that we likely do not have further options in the left upper extremity and recommended a right brachiocephalic arteriovenous fistula. She is in agreement. Plan to do this on a non-dialysis day in the near future.   CHIEF COMPLAINT: thrombosed left arm graft  HISTORY OF PRESENT ILLNESS: Julie Wells is a 52 y.o. female with ESRD dialyzing via RIJ TDC. Her left arm arteriovenous graft has unfortunately thrombosed. She presents to clinic for evaluation of new permanent access.   Past Medical History:  Diagnosis Date   Chronic kidney disease    Frequent headaches    Hematuria 06/20/2016   High cholesterol    History of blood transfusion    multiple, no reactions   Hypertension    Migraine    " I constantly have my migraines" (06/23/2016)   Pneumonia    Short-term memory loss    /notes 06/23/2016   Sickle cell disease without crisis ([place]) 03/12/2020   Sickle cell trait ([place])    Stroke ([place]) 2010   short term memory loss/notes 06/23/2016   Type 2 diabetes, diet controlled ([place])    no meds, diet controlled    Past Surgical History:  Procedure Laterality Date   AV FISTULA PLACEMENT Left 12/04/2020   Procedure: LEFT BRACHOBASCILIC  ARTERIOVENOUS (AV) FISTULA CREATION;  Surgeon: [name], MD;  Location: [facility];  Service: Vascular;  Laterality: Left;  PERIPHERAL NERVE BLOCK   AV FISTULA PLACEMENT Left 12/25/2020   Procedure: LEFT UPPER ARM ARTERIOVENOUS LOOP GRAFT PLACEMENT;  Surgeon: [name], MD;  Location: [facility];  Service: Vascular;  Laterality: Left;   CESAREAN SECTION  X 2   COLONOSCOPY  2021   x 3   IR FLUORO GUIDE CV LINE RIGHT  10/08/2022   TUBAL LIGATION      History reviewed. No pertinent family history.  Social History   Socioeconomic History   Marital status: Married    Spouse name: Not on file   Number of children:  Not on file   Years of education: Not on file   Highest education level: Not on file  Occupational History   Not on file  Tobacco Use   Smoking status: Never   Smokeless tobacco: Never  Vaping Use   Vaping Use: Never used  Substance and Sexual Activity   Alcohol use: No   Drug use: No   Sexual activity: Not Currently    Birth control/protection: Surgical    Comment: Tubal Ligation  Other Topics Concern   Not on file  Social History Narrative   ** Merged History Encounter **       Social Determinants of Health   Financial Resource Strain: Not on file  Food Insecurity: No Food Insecurity (10/06/2022)   Hunger Vital Sign    Worried About Running Out of Food in the Last Year: Never true    Ran Out of Food in the Last Year: Never true  Transportation Needs: No Transportation Needs (10/06/2022)   PRAPARE - Hydrologist (Medical): No    Lack of Transportation (Non-Medical): No  Physical Activity: Not on file  Stress: Not on file  Social Connections: Not on file  Intimate Partner Violence: Not At Risk (10/07/2022)   Humiliation, Afraid, Rape, and Kick questionnaire    Fear of Current or Ex-Partner: No    Emotionally Abused: No  Physically Abused: No    Sexually Abused: No    Allergies  Allergen Reactions   Levofloxacin Anaphylaxis   Azithromycin Hives   Ciprofloxacin Swelling   Levaquin [Levofloxacin In D5w] Hives and Swelling   Other Swelling    Unnamed med caused swelling of the lips (patient cannot recall the indication or name)    Current Outpatient Medications  Medication Sig Dispense Refill   acetaminophen (TYLENOL) 500 MG tablet Take 500-1,000 mg by mouth every 6 (six) hours as needed for mild pain or headache.      albuterol (VENTOLIN HFA) 108 (90 Base) MCG/ACT inhaler Inhale 2 puffs into the lungs every 4 (four) hours as needed for wheezing or shortness of breath. 18 g 0   atorvastatin (LIPITOR) 40 MG tablet Take 40 mg by mouth  daily.     B Complex-C-Folic Acid (DIALYVITE TABLET) TABS Take 1 tablet by mouth daily.     carvedilol (COREG) 6.25 MG tablet Take 0.5 tablets (3.125 mg total) by mouth 2 (two) times daily with a meal. 30 tablet 0   Cholecalciferol 50 MCG (2000 UT) CAPS Take 2,000 Units by mouth daily.     cinacalcet (SENSIPAR) 30 MG tablet Take 30 mg by mouth daily.     cyclobenzaprine (FLEXERIL) 5 MG tablet Take 1 tablet (5 mg total) by mouth 3 (three) times daily as needed for muscle spasms. 20 tablet 0   EPINEPHrine 0.3 mg/0.3 mL IJ SOAJ injection Inject 0.3 mg into the muscle as needed for anaphylaxis. 1 each 0   hydroxyurea (HYDREA) 500 MG capsule Take 500 mg by mouth daily. May take with food to minimize GI side effects.     lidocaine-prilocaine (EMLA) cream Apply 1 Application topically 2 (two) times a week.     Oxycodone HCl 10 MG TABS Take 10 mg by mouth every 6 (six) hours as needed (pain).     patiromer (VELTASSA) 8.4 g packet Take 8.4 g by mouth See admin instructions. Take on Sunday and Tuesday.     predniSONE (DELTASONE) 50 MG tablet 1 tablet PO QD X4 days 4 tablet 0   sevelamer carbonate (RENVELA) 800 MG tablet Take 1,600 mg by mouth 3 (three) times daily with meals.     No current facility-administered medications for this visit.    PHYSICAL EXAM Vitals:   10/19/22 1257  BP: 128/79  Pulse: 85  Resp: 20  Temp: 98.6 F (37 C)  SpO2: 99%  Weight: 166 lb (75.3 kg)  Height: '5\' 2"'$  (1.575 m)   Well appearing woman No acute distress Regular rate and rhythm Unlabored breathing 2+ brachial pulses Thrombosed left arm graft    PERTINENT LABORATORY AND RADIOLOGIC DATA  Most recent CBC    Latest Ref Rng & Units 10/12/2022    6:51 AM 10/11/2022    8:47 AM 10/10/2022    7:34 AM  CBC  WBC 4.0 - 10.5 K/uL 13.2  12.9  15.0   Hemoglobin 12.0 - 15.0 g/dL 8.4  6.6  7.2   Hematocrit 36.0 - 46.0 % 23.2  18.9  20.6   Platelets 150 - 400 K/uL 389  387  353      Most recent CMP    Latest Ref  Rng & Units 10/11/2022    8:47 AM 10/10/2022    7:34 AM 10/08/2022    5:16 AM  CMP  Glucose 70 - 99 mg/dL 111  107  84   BUN 6 - 20 mg/dL 41  30  45  Creatinine 0.44 - 1.00 mg/dL 9.98  7.88  10.84   Sodium 135 - 145 mmol/L 133  133  134   Potassium 3.5 - 5.1 mmol/L 4.3  4.9  5.3   Chloride 98 - 111 mmol/L 94  96  95   CO2 22 - 32 mmol/L '25  25  23   '$ Calcium 8.9 - 10.3 mg/dL 7.9  7.9  7.3     Renal function Estimated Creatinine Clearance: 6.2 mL/min (A) (by C-G formula based on SCr of 9.98 mg/dL (H)).  Hgb A1c MFr Bld (%)  Date Value  06/20/2016 4.5 (L)    No results found for: "LDLCALC", "LDLC", "HIRISKLDL", "POCLDL", "LDLDIRECT", "REALLDLC", "TOTLDLC"   Eliya Bubar N. Stanford Breed, MD FACS [facility] Phone Number: [phone] 10/19/2022 8:33 PM   Total time spent on preparing this encounter including chart review, data review, collecting history, examining the patient, coordinating care for this established patient, 30 minutes.  Portions of this report may have been transcribed using voice recognition software.  Every effort has been made to ensure accuracy; however, inadvertent computerized transcription errors may still be present.

## 2022-10-20 ENCOUNTER — Other Ambulatory Visit: Payer: Self-pay

## 2022-10-20 DIAGNOSIS — N186 End stage renal disease: Secondary | ICD-10-CM

## 2022-10-26 ENCOUNTER — Encounter (HOSPITAL_COMMUNITY): Payer: Self-pay | Admitting: Vascular Surgery

## 2022-10-26 NOTE — Anesthesia Preprocedure Evaluation (Signed)
Anesthesia Evaluation  Patient identified by MRN, date of birth, ID band Patient awake    Reviewed: Allergy & Precautions, NPO status , Patient's Chart, lab work & pertinent test results  History of Anesthesia Complications Negative for: history of anesthetic complications  Airway Mallampati: II  TM Distance: >3 FB Neck ROM: Full    Dental  (+) Missing,    Pulmonary neg pulmonary ROS   Pulmonary exam normal        Cardiovascular hypertension, Pt. on medications and Pt. on home beta blockers Normal cardiovascular exam     Neuro/Psych CVA (2010)  negative psych ROS   GI/Hepatic negative GI ROS, Neg liver ROS,,,  Endo/Other  diabetes, Type 2    Renal/GU ESRF and DialysisRenal disease (HD M/W/F)  negative genitourinary   Musculoskeletal negative musculoskeletal ROS (+)    Abdominal   Peds  Hematology  (+) Blood dyscrasia (Hgb 9.5), Sickle cell trait and anemia   Anesthesia Other Findings Day of surgery medications reviewed with patient.  Reproductive/Obstetrics negative OB ROS                             Anesthesia Physical Anesthesia Plan  ASA: 3  Anesthesia Plan: MAC   Post-op Pain Management: Minimal or no pain anticipated   Induction:   PONV Risk Score and Plan: 2 and Treatment may vary due to age or medical condition, Propofol infusion, Ondansetron and Midazolam  Airway Management Planned: Natural Airway and Simple Face Mask  Additional Equipment: None  Intra-op Plan:   Post-operative Plan:   Informed Consent: I have reviewed the patients History and Physical, chart, labs and discussed the procedure including the risks, benefits and alternatives for the proposed anesthesia with the patient or authorized representative who has indicated his/her understanding and acceptance.       Plan Discussed with: CRNA  Anesthesia Plan Comments: (PAT note by Karoline Caldwell,  PA-C: 52 year old female with pertinent history including HTN, sickle cell trait, diet-controlled DM2, chronic anemia, ESRD dialyzing via right IJ [facility] Monday Wednesday Friday.  Admission 1/17 through 10/13/2022 for severe left flank pain felt likely due to pyelonephritis involving the left kidney.  During admission she was also incidentally found to have left lower lobe consolidative changes of unclear significance, radiology read seems to favor atelectasis, there was consideration for possible developing pneumonia.  She was relatively asymptomatic from this, she had a mild dry cough but was ambulating on room air with no other symptoms.  She was treated with Rocephin and azithromycin.  Reviewed recent hospitalization with anesthesiologist Dr. Tobias Alexander.  Advised patient can proceed as planned in the absence of any respiratory symptoms.  Patient will need day of surgery labs and evaluation.  EKG 10/06/2022: NSR.  Rate 90.  Nonspecific T abnormalities, lateral leads.  TTE 06/08/2022 (Care Everywhere): NORMAL LEFT VENTRICULAR SYSTOLIC FUNCTION  NORMAL RIGHT VENTRICULAR SYSTOLIC FUNCTION  VALVULAR REGURGITATION: TRIVIAL AR, TRIVIAL MR, TRIVIAL PR, TRIVIAL TR  NO VALVULAR STENOSIS  INSUFFICIENT TR TO ESTIMATE RVSP   Nuclear stress 12/24/2021 (Care Everywhere): FINAL COMMENTS  Myocardial perfusion imaging is normal.  Normal left ventricular systolic function.    )        Anesthesia Quick Evaluation

## 2022-10-26 NOTE — Progress Notes (Signed)
PCP - Dr Clovis Cao Cardiologist - Dr Azzie Glatter Nephrology - Dr Salvatore Decent  Chest x-ray - 10/12/22 EKG - 10/06/22 Stress Test - 12/24/21 CE ECHO - 06/08/22 CE Cardiac Cath - n/a  ICD Pacemaker/Loop - n/a  Sleep Study -  n/a CPAP - none  Diabetes Type 2, no meds, diet controlled.  Patient does not check blood sugar.  Anesthesia review: Yes  STOP now taking any Aspirin (unless otherwise instructed by your surgeon), Aleve, Naproxen, Ibuprofen, Motrin, Advil, Goody's, BC's, all herbal medications, fish oil, and all vitamins.   Coronavirus Screening Do you have any of the following symptoms:  Cough No  Fever (>100.98F)  yes/no: No Runny nose yes/no: No Sore throat yes/no: No Difficulty breathing/shortness of breath  yes/no: No  Have you traveled in the last 14 days and where? yes/no: No  Patient/husband Cedric verbalized understanding of instructions that were given via phone.

## 2022-10-26 NOTE — Progress Notes (Signed)
Anesthesia Chart Review: Same day workup  52 year old female with pertinent history including HTN, sickle cell trait, diet-controlled DM2, chronic anemia, ESRD dialyzing via right IJ [facility] Monday Wednesday Friday.  Admission 1/17 through 10/13/2022 for severe left flank pain felt likely due to pyelonephritis involving the left kidney.  During admission she was also incidentally found to have left lower lobe consolidative changes of unclear significance, radiology read seems to favor atelectasis, there was consideration for possible developing pneumonia.  She was relatively asymptomatic from this, she had a mild dry cough but was ambulating on room air with no other symptoms.  She was treated with Rocephin and azithromycin.  Reviewed recent hospitalization with anesthesiologist Dr. Tobias Alexander.  Advised patient can proceed as planned in the absence of any respiratory symptoms.  Patient will need day of surgery labs and evaluation.  EKG 10/06/2022: NSR.  Rate 90.  Nonspecific T abnormalities, lateral leads.  TTE 06/08/2022 (Care Everywhere): NORMAL LEFT VENTRICULAR SYSTOLIC FUNCTION    NORMAL RIGHT VENTRICULAR SYSTOLIC FUNCTION    VALVULAR REGURGITATION: TRIVIAL AR, TRIVIAL MR, TRIVIAL PR, TRIVIAL TR    NO VALVULAR STENOSIS    INSUFFICIENT TR TO ESTIMATE RVSP   Nuclear stress 12/24/2021 (Care Everywhere):  FINAL COMMENTS   Myocardial perfusion imaging is normal.   Normal left ventricular systolic function.     Wynonia Musty [facility] Short Stay Center/Anesthesiology Phone [phone] 10/26/2022 9:19 AM

## 2022-10-28 ENCOUNTER — Ambulatory Visit (HOSPITAL_BASED_OUTPATIENT_CLINIC_OR_DEPARTMENT_OTHER): Payer: Medicare PPO | Admitting: Physician Assistant

## 2022-10-28 ENCOUNTER — Ambulatory Visit (HOSPITAL_COMMUNITY)
Admission: RE | Admit: 2022-10-28 | Discharge: 2022-10-28 | Disposition: A | Discharge status: Home / Self Care | Payer: Medicare PPO | Attending: Vascular Surgery | Admitting: Vascular Surgery

## 2022-10-28 ENCOUNTER — Encounter (HOSPITAL_COMMUNITY): Payer: Self-pay | Admitting: Vascular Surgery

## 2022-10-28 ENCOUNTER — Other Ambulatory Visit: Payer: Self-pay

## 2022-10-28 ENCOUNTER — Ambulatory Visit (HOSPITAL_COMMUNITY): Payer: Medicare PPO | Admitting: Physician Assistant

## 2022-10-28 ENCOUNTER — Encounter (HOSPITAL_COMMUNITY)
Admission: RE | Disposition: A | Discharge status: Home / Self Care | Payer: Self-pay | Source: Home / Self Care | Attending: Vascular Surgery

## 2022-10-28 DIAGNOSIS — E1122 Type 2 diabetes mellitus with diabetic chronic kidney disease: Secondary | ICD-10-CM

## 2022-10-28 DIAGNOSIS — I120 Hypertensive chronic kidney disease with stage 5 chronic kidney disease or end stage renal disease: Secondary | ICD-10-CM

## 2022-10-28 DIAGNOSIS — T82868A Thrombosis of vascular prosthetic devices, implants and grafts, initial encounter: Secondary | ICD-10-CM | POA: Diagnosis not present

## 2022-10-28 DIAGNOSIS — Y713 Surgical instruments, materials and cardiovascular devices (including sutures) associated with adverse incidents: Secondary | ICD-10-CM | POA: Insufficient documentation

## 2022-10-28 DIAGNOSIS — Z8673 Personal history of transient ischemic attack (TIA), and cerebral infarction without residual deficits: Secondary | ICD-10-CM | POA: Insufficient documentation

## 2022-10-28 DIAGNOSIS — D571 Sickle-cell disease without crisis: Secondary | ICD-10-CM | POA: Diagnosis not present

## 2022-10-28 DIAGNOSIS — N186 End stage renal disease: Secondary | ICD-10-CM | POA: Diagnosis not present

## 2022-10-28 DIAGNOSIS — N185 Chronic kidney disease, stage 5: Secondary | ICD-10-CM

## 2022-10-28 DIAGNOSIS — D631 Anemia in chronic kidney disease: Secondary | ICD-10-CM | POA: Diagnosis not present

## 2022-10-28 DIAGNOSIS — D759 Disease of blood and blood-forming organs, unspecified: Secondary | ICD-10-CM | POA: Insufficient documentation

## 2022-10-28 DIAGNOSIS — Z992 Dependence on renal dialysis: Secondary | ICD-10-CM

## 2022-10-28 HISTORY — PX: AV FISTULA PLACEMENT: SHX1204

## 2022-10-28 HISTORY — DX: Anemia, unspecified: D64.9

## 2022-10-28 LAB — POCT I-STAT, CHEM 8
BUN: 38 mg/dL — ABNORMAL HIGH (ref 6–20)
Calcium, Ion: 1.01 mmol/L — ABNORMAL LOW (ref 1.15–1.40)
Chloride: 102 mmol/L (ref 98–111)
Creatinine, Ser: 6.6 mg/dL — ABNORMAL HIGH (ref 0.44–1.00)
Glucose, Bld: 107 mg/dL — ABNORMAL HIGH (ref 70–99)
HCT: 28 % — ABNORMAL LOW (ref 36.0–46.0)
Hemoglobin: 9.5 g/dL — ABNORMAL LOW (ref 12.0–15.0)
Potassium: 4.3 mmol/L (ref 3.5–5.1)
Sodium: 141 mmol/L (ref 135–145)
TCO2: 31 mmol/L (ref 22–32)

## 2022-10-28 LAB — GLUCOSE, CAPILLARY
Glucose-Capillary: 104 mg/dL — ABNORMAL HIGH (ref 70–99)
Glucose-Capillary: 105 mg/dL — ABNORMAL HIGH (ref 70–99)

## 2022-10-28 LAB — HCG, SERUM, QUALITATIVE: Preg, Serum: NEGATIVE

## 2022-10-28 SURGERY — ARTERIOVENOUS (AV) FISTULA CREATION
Anesthesia: Monitor Anesthesia Care | Site: Arm Upper | Laterality: Right

## 2022-10-28 MED ORDER — ACETAMINOPHEN 500 MG PO TABS
1000.0000 mg | ORAL_TABLET | Freq: Once | ORAL | Status: AC
  Administered 2022-10-28: 1000 mg via ORAL
  Filled 2022-10-28: qty 2

## 2022-10-28 MED ORDER — ORAL CARE MOUTH RINSE: 15.0000 mL | Freq: Once | OROMUCOSAL | Status: AC

## 2022-10-28 MED ORDER — FENTANYL CITRATE (PF) 100 MCG/2ML IJ SOLN: 25.0000 ug | INTRAMUSCULAR | Status: DC | PRN

## 2022-10-28 MED ORDER — OXYCODONE HCL 5 MG PO TABS: 5.0000 mg | ORAL_TABLET | Freq: Once | ORAL | Status: DC | PRN

## 2022-10-28 MED ORDER — FENTANYL CITRATE (PF) 250 MCG/5ML IJ SOLN
INTRAMUSCULAR | Status: AC
  Filled 2022-10-28: qty 5

## 2022-10-28 MED ORDER — HEPARIN 6000 UNIT IRRIGATION SOLUTION
Status: AC
  Filled 2022-10-28: qty 500

## 2022-10-28 MED ORDER — PHENYLEPHRINE 80 MCG/ML (10ML) SYRINGE FOR IV PUSH (FOR BLOOD PRESSURE SUPPORT)
PREFILLED_SYRINGE | INTRAVENOUS | Status: DC | PRN
  Administered 2022-10-28 (×2): 160 ug via INTRAVENOUS
  Administered 2022-10-28: 80 ug via INTRAVENOUS
  Administered 2022-10-28: 160 ug via INTRAVENOUS
  Administered 2022-10-28: 80 ug via INTRAVENOUS

## 2022-10-28 MED ORDER — PAPAVERINE HCL 30 MG/ML IJ SOLN
INTRAMUSCULAR | Status: AC
  Filled 2022-10-28: qty 2

## 2022-10-28 MED ORDER — PROPOFOL 500 MG/50ML IV EMUL
INTRAVENOUS | Status: DC | PRN
  Administered 2022-10-28: 20 mg via INTRAVENOUS
  Administered 2022-10-28: 125 ug/kg/min via INTRAVENOUS

## 2022-10-28 MED ORDER — FENTANYL CITRATE (PF) 250 MCG/5ML IJ SOLN
INTRAMUSCULAR | Status: DC | PRN
  Administered 2022-10-28: 50 ug via INTRAVENOUS

## 2022-10-28 MED ORDER — LIDOCAINE-EPINEPHRINE (PF) 1 %-1:200000 IJ SOLN
INTRAMUSCULAR | Status: AC
  Filled 2022-10-28: qty 30

## 2022-10-28 MED ORDER — EPHEDRINE SULFATE-NACL 50-0.9 MG/10ML-% IV SOSY
PREFILLED_SYRINGE | INTRAVENOUS | Status: DC | PRN
  Administered 2022-10-28: 10 mg via INTRAVENOUS

## 2022-10-28 MED ORDER — MIDAZOLAM HCL 2 MG/2ML IJ SOLN
INTRAMUSCULAR | Status: AC
  Filled 2022-10-28: qty 2

## 2022-10-28 MED ORDER — EPINEPHRINE PF 1 MG/ML IJ SOLN
INTRAMUSCULAR | Status: DC | PRN
  Administered 2022-10-28: .125 mg via SUBCUTANEOUS

## 2022-10-28 MED ORDER — ONDANSETRON HCL 4 MG/2ML IJ SOLN
INTRAMUSCULAR | Status: AC
  Filled 2022-10-28: qty 2

## 2022-10-28 MED ORDER — ONDANSETRON HCL 4 MG/2ML IJ SOLN
INTRAMUSCULAR | Status: DC | PRN
  Administered 2022-10-28: 4 mg via INTRAVENOUS

## 2022-10-28 MED ORDER — SODIUM CHLORIDE 0.9 % IV SOLN: INTRAVENOUS | Status: DC | PRN

## 2022-10-28 MED ORDER — CEFAZOLIN SODIUM-DEXTROSE 2-4 GM/100ML-% IV SOLN
2.0000 g | INTRAVENOUS | Status: AC
  Administered 2022-10-28: 2 g via INTRAVENOUS
  Filled 2022-10-28: qty 100

## 2022-10-28 MED ORDER — OXYCODONE HCL 10 MG PO TABS: 10.0000 mg | ORAL_TABLET | Freq: Four times a day (QID) | ORAL | 0 refills | Status: AC | PRN

## 2022-10-28 MED ORDER — SODIUM CHLORIDE 0.9 % IV SOLN: INTRAVENOUS | Status: DC

## 2022-10-28 MED ORDER — MIDAZOLAM HCL 2 MG/2ML IJ SOLN
INTRAMUSCULAR | Status: DC | PRN
  Administered 2022-10-28: 2 mg via INTRAVENOUS

## 2022-10-28 MED ORDER — PHENYLEPHRINE HCL-NACL 20-0.9 MG/250ML-% IV SOLN
INTRAVENOUS | Status: DC | PRN
  Administered 2022-10-28: 30 ug/min via INTRAVENOUS

## 2022-10-28 MED ORDER — CHLORHEXIDINE GLUCONATE 4 % EX LIQD: 60.0000 mL | Freq: Once | CUTANEOUS | Status: DC

## 2022-10-28 MED ORDER — CHLORHEXIDINE GLUCONATE 0.12 % MT SOLN
OROMUCOSAL | Status: AC
  Administered 2022-10-28: 15 mL via OROMUCOSAL
  Filled 2022-10-28: qty 15

## 2022-10-28 MED ORDER — EPHEDRINE 5 MG/ML INJ
INTRAVENOUS | Status: AC
  Filled 2022-10-28: qty 5

## 2022-10-28 MED ORDER — INSULIN ASPART 100 UNIT/ML IJ SOLN: 0.0000 [IU] | INTRAMUSCULAR | Status: DC | PRN

## 2022-10-28 MED ORDER — HEPARIN 6000 UNIT IRRIGATION SOLUTION
Status: DC | PRN
  Administered 2022-10-28: 1

## 2022-10-28 MED ORDER — MEPIVACAINE HCL (PF) 2 % IJ SOLN
INTRAMUSCULAR | Status: DC | PRN
  Administered 2022-10-28: 25 mL

## 2022-10-28 MED ORDER — OXYCODONE HCL 5 MG/5ML PO SOLN: 5.0000 mg | Freq: Once | ORAL | Status: DC | PRN

## 2022-10-28 MED ORDER — PROMETHAZINE HCL 25 MG/ML IJ SOLN: 6.2500 mg | INTRAMUSCULAR | Status: DC | PRN

## 2022-10-28 MED ORDER — 0.9 % SODIUM CHLORIDE (POUR BTL) OPTIME
TOPICAL | Status: DC | PRN
  Administered 2022-10-28: 1000 mL

## 2022-10-28 MED ORDER — CHLORHEXIDINE GLUCONATE 0.12 % MT SOLN: 15.0000 mL | Freq: Once | OROMUCOSAL | Status: AC

## 2022-10-28 SURGICAL SUPPLY — 37 items
ARMBAND PINK RESTRICT EXTREMIT (MISCELLANEOUS) ×1 IMPLANT
BENZOIN TINCTURE PRP APPL 2/3 (GAUZE/BANDAGES/DRESSINGS) ×1 IMPLANT
CANISTER SUCT 3000ML PPV (MISCELLANEOUS) ×1 IMPLANT
CANNULA VESSEL 3MM 2 BLNT TIP (CANNULA) ×1 IMPLANT
CHLORAPREP W/TINT 26 (MISCELLANEOUS) ×1 IMPLANT
CLIP LIGATING EXTRA MED SLVR (CLIP) ×1 IMPLANT
CLIP LIGATING EXTRA SM BLUE (MISCELLANEOUS) ×1 IMPLANT
CLSR STERI-STRIP ANTIMIC 1/2X4 (GAUZE/BANDAGES/DRESSINGS) IMPLANT
COVER PROBE W GEL 5X96 (DRAPES) IMPLANT
ELECT REM PT RETURN 9FT ADLT (ELECTROSURGICAL) ×1
ELECTRODE REM PT RTRN 9FT ADLT (ELECTROSURGICAL) ×1 IMPLANT
GLOVE BIO SURGEON STRL SZ8 (GLOVE) ×1 IMPLANT
GOWN STRL REUS W/ TWL LRG LVL3 (GOWN DISPOSABLE) ×2 IMPLANT
GOWN STRL REUS W/ TWL XL LVL3 (GOWN DISPOSABLE) ×1 IMPLANT
GOWN STRL REUS W/TWL LRG LVL3 (GOWN DISPOSABLE) ×2
GOWN STRL REUS W/TWL XL LVL3 (GOWN DISPOSABLE) ×1
INSERT FOGARTY SM (MISCELLANEOUS) IMPLANT
KIT BASIN OR (CUSTOM PROCEDURE TRAY) ×1 IMPLANT
KIT TURNOVER KIT B (KITS) ×1 IMPLANT
LOOP VASCULAR MINI 18 RED (MISCELLANEOUS) ×1
NDL 18GX1X1/2 (RX/OR ONLY) (NEEDLE) IMPLANT
NEEDLE 18GX1X1/2 (RX/OR ONLY) (NEEDLE) IMPLANT
NS IRRIG 1000ML POUR BTL (IV SOLUTION) ×1 IMPLANT
PACK CV ACCESS (CUSTOM PROCEDURE TRAY) ×1 IMPLANT
PAD ARMBOARD 7.5X6 YLW CONV (MISCELLANEOUS) ×2 IMPLANT
SLING ARM FOAM STRAP LRG (SOFTGOODS) IMPLANT
SLING ARM FOAM STRAP MED (SOFTGOODS) IMPLANT
STRIP CLOSURE SKIN 1/2X4 (GAUZE/BANDAGES/DRESSINGS) ×1 IMPLANT
SUT MNCRL AB 4-0 PS2 18 (SUTURE) ×1 IMPLANT
SUT PROLENE 6 0 BV (SUTURE) ×1 IMPLANT
SUT VIC AB 3-0 SH 27 (SUTURE) ×1
SUT VIC AB 3-0 SH 27X BRD (SUTURE) ×1 IMPLANT
SYR 3ML LL SCALE MARK (SYRINGE) IMPLANT
TOWEL GREEN STERILE (TOWEL DISPOSABLE) ×1 IMPLANT
UNDERPAD 30X36 HEAVY ABSORB (UNDERPADS AND DIAPERS) ×1 IMPLANT
VASCULAR TIE MINI RED 18IN STL (MISCELLANEOUS) IMPLANT
WATER STERILE IRR 1000ML POUR (IV SOLUTION) ×1 IMPLANT

## 2022-10-28 NOTE — Anesthesia Procedure Notes (Signed)
Anesthesia Regional Block: Supraclavicular block   Pre-Anesthetic Checklist: , timeout performed,  Correct Patient, Correct Site, Correct Laterality,  Correct Procedure, Correct Position, site marked,  Risks and benefits discussed,  Pre-op evaluation,  At surgeon's request and post-op pain management  Laterality: Right  Prep: Maximum Sterile Barrier Precautions used, chloraprep       Needles:  Injection technique: Single-shot  Needle Type: Echogenic Stimulator Needle     Needle Length: 9cm  Needle Gauge: 22     Additional Needles:   Procedures:,,,, ultrasound used (permanent image in chart),,    Narrative:  Start time: 10/28/2022 7:17 AM End time: 10/28/2022 7:20 AM Injection made incrementally with aspirations every 5 mL.  Performed by: Personally  Anesthesiologist: Brennan Bailey, MD  Additional Notes: Risks, benefits, and alternative discussed. Patient gave consent for procedure. Patient prepped and draped in sterile fashion. Sedation administered, patient remains easily responsive to voice. Relevant anatomy identified with ultrasound guidance. Local anesthetic given in 5cc increments with no signs or symptoms of intravascular injection. No pain or paraesthesias with injection. Patient monitored throughout procedure with signs of LAST or immediate complications. Tolerated well. Ultrasound image placed in chart.  Tawny Asal, MD

## 2022-10-28 NOTE — Op Note (Signed)
DATE OF SERVICE: 10/28/2022  PATIENT:  Julie Wells  52 y.o. female  PRE-OPERATIVE DIAGNOSIS:  ESRD  POST-OPERATIVE DIAGNOSIS:  Same  PROCEDURE:   Right brachiocephalic arteriovenous fistula creation  SURGEON:  Surgeon(s) and Role:    * [name], MD - Primary  ASSISTANT: Arlee Muslim, PA-C  An experienced assistant was required given the complexity of this procedure and the standard of surgical care. My assistant helped with exposure through counter tension, suctioning, ligation and retraction to better visualize the surgical field.  My assistant expedited sewing during the case by following my sutures. Wherever I use the term "we" in the report, my assistant actively helped me with that portion of the procedure.  ANESTHESIA:   regional and MAC  EBL: minimal  BLOOD ADMINISTERED:none  DRAINS: none   LOCAL MEDICATIONS USED:  NONE  SPECIMEN:  none  COUNTS: confirmed correct.  TOURNIQUET:  none  PATIENT DISPOSITION:  PACU - hemodynamically stable.   Delay start of Pharmacological VTE agent (>24hrs) due to surgical blood loss or risk of bleeding: no  INDICATION FOR PROCEDURE: Julie Wells is a 52 y.o. female with ESRD in need of permanent dialysis access. After careful discussion of risks, benefits, and alternatives the patient was offered right brachiocephalic arteriovenous fistula creation. The patient understood and wished to proceed.  OPERATIVE FINDINGS: healthy cephalic vein. Healthy brachial artery. Good doppler bruit at completion. Radial and ulnar signal at completion.  DESCRIPTION OF PROCEDURE: After identification of the patient in the pre-operative holding area, the patient was transferred to the operating room. The patient was positioned supine on the operating room table. Anesthesia was induced. The right arm was prepped and draped in standard fashion. A surgical pause was performed confirming correct patient, procedure, and operative location.  Using  intraoperative ultrasound, the course of the right upper extremity superficial veins was mapped.  The cephalic vein appeared adequate for arteriovenous fistula creation.  The brachial artery was similarly mapped.  The artery appeared adequate for arterial venous creation. We ensured there was no anomalous arterial anatomy such as a high bifurcation.  A transverse incision was made in the right arm just distal to the antecubital crease.  Incision was carried down through subcutaneous tissue until the cephalic vein was identified and skeletonized.  We continued our exposure through the aponeurosis of the biceps.  The brachial artery was encountered its usual position.  The artery was circumferentially exposed and encircled with Silastic Vesseloops.  Patient was systemically heparinized.  The distal cephalic vein was transected.  The distal stump of the cephalic vein was oversewn with a 2-0 silk suture.  The proximal vein was controlled with a bulldog clamp.  The brachial artery was clamped proximally medially.  An anterior arteriotomy was made with a 11 blade.  The arteriotomy was extended with Potts scissors.  Using a parachute technique the cephalic vein was anastomosed to the brachial arteriotomy in end-to-side fashion with continuous running suture of 6-0 Prolene.  Immediately prior to completion the anastomosis was flushed and de-aired.  The anastomosis was completed.  Hemostasis was assured.  The fistula was interrogated with Doppler.  Audible bruit was heard throughout the course of the cephalic vein.  A radial artery signal was heard which augmented slightly with compression of the fistula.  Upon completion of the case instrument and sharps counts were confirmed correct. The patient was transferred to the PACU in good condition. I was present for all portions of the procedure.  Julie Wells. Stanford Breed,  MD FACS [facility] Phone Number: [phone] 10/28/2022 11:19  AM

## 2022-10-28 NOTE — Anesthesia Postprocedure Evaluation (Signed)
Anesthesia Post Note  Patient: Julie Wells  Procedure(s) Performed: RIGHT BRACHIOCEPHALIC ARTERIOVENOUS FISTULA CREATION (Right: Arm Upper)     Patient location during evaluation: PACU Anesthesia Type: Regional Level of consciousness: awake and alert Pain management: pain level controlled Vital Signs Assessment: post-procedure vital signs reviewed and stable Respiratory status: spontaneous breathing, nonlabored ventilation and respiratory function stable Cardiovascular status: blood pressure returned to baseline Postop Assessment: no apparent nausea or vomiting Anesthetic complications: no   No notable events documented.  Last Vitals:  Vitals:   10/28/22 0930 10/28/22 0945  BP: 99/69 96/68  Pulse: 72 74  Resp: 13 15  Temp:    SpO2: 93% 95%    Last Pain:  Vitals:   10/28/22 0915  PainSc: Asleep                 Marthenia Rolling

## 2022-10-28 NOTE — Interval H&P Note (Signed)
History and Physical Interval Note:  10/28/2022 7:35 AM  Julie Wells  has presented today for surgery, with the diagnosis of End Stage Renal Disease.  The various methods of treatment have been discussed with the patient and family. After consideration of risks, benefits and other options for treatment, the patient has consented to  Procedure(s): RIGHT BRACHIOCEPHALIC ARTERIOVENOUS (AV) FISTULA CREATION (Right) as a surgical intervention.  The patient's history has been reviewed, patient examined, no change in status, stable for surgery.  I have reviewed the patient's chart and labs.  Questions were answered to the patient's satisfaction.     [name]

## 2022-10-28 NOTE — Transfer of Care (Signed)
Immediate Anesthesia Transfer of Care Note  Patient: Julie Wells  Procedure(s) Performed: RIGHT BRACHIOCEPHALIC ARTERIOVENOUS FISTULA CREATION (Right: Arm Upper)  Patient Location: PACU  Anesthesia Type:MAC and Regional  Level of Consciousness: awake, drowsy, and patient cooperative  Airway & Oxygen Therapy: Patient Spontanous Breathing and Patient connected to nasal cannula oxygen  Post-op Assessment: Report given to RN and Post -op Vital signs reviewed and stable  Post vital signs: Reviewed and stable  Last Vitals:  Vitals Value Taken Time  BP    Temp    Pulse    Resp    SpO2      Last Pain:  Vitals:   10/28/22 0614  PainSc: 0-No pain         Complications: No notable events documented.

## 2022-10-28 NOTE — Discharge Instructions (Signed)
° °  [facility] [place] ° °Discharge Instructions ° °AV Fistula or Graft Surgery for Dialysis Access ° °Please refer to the following instructions for your post-procedure care. Your surgeon or physician assistant will discuss any changes with you. ° °Activity ° °You may drive the day following your surgery, if you are comfortable and no longer taking prescription pain medication. Resume full activity as the soreness in your incision resolves. ° °Bathing/Showering ° °You may shower after you go home. Keep your incision dry for 48 hours. Do not soak in a bathtub, hot tub, or swim until the incision heals completely. You may not shower if you have a hemodialysis catheter. ° °Incision Care ° °Clean your incision with mild soap and water after 48 hours. Pat the area dry with a clean towel. You do not need a bandage unless otherwise instructed. Do not apply any ointments or creams to your incision. You may have skin glue on your incision. Do not peel it off. It will come off on its own in about one week. Your arm may swell a bit after surgery. To reduce swelling use pillows to elevate your arm so it is above your heart. Your doctor will tell you if you need to lightly wrap your arm with an ACE bandage. ° °Diet ° °Resume your normal diet. There are not special food restrictions following this procedure. In order to heal from your surgery, it is CRITICAL to get adequate nutrition. Your body requires vitamins, minerals, and protein. Vegetables are the best source of vitamins and minerals. Vegetables also provide the perfect balance of protein. Processed food has little nutritional value, so try to avoid this. ° °Medications ° °Resume taking all of your medications. If your incision is causing pain, you may take over-the counter pain relievers such as acetaminophen (Tylenol). If you were prescribed a stronger pain medication, please be aware these medications can cause nausea and constipation. Prevent  nausea by taking the medication with a snack or meal. Avoid constipation by drinking plenty of fluids and eating foods with high amount of fiber, such as fruits, vegetables, and grains. Do not take Tylenol if you are taking prescription pain medications. ° ° ° ° °Follow up °Your surgeon may want to see you in the office following your access surgery. If so, this will be arranged at the time of your surgery. ° °Please call us immediately for any of the following conditions: ° °Increased pain, redness, drainage (pus) from your incision site °Fever of 101 degrees or higher °Severe or worsening pain at your incision site °Hand pain or numbness. ° °Reduce your risk of vascular disease: ° °Stop smoking. If you would like help, call [facility] at 1-800-QUIT-NOW (1-800-784-8669) or [place] at 336-586-4000 ° °Manage your cholesterol °Maintain a desired weight °Control your diabetes °Keep your blood pressure down ° °Dialysis ° °It will take several weeks to several months for your new dialysis access to be ready for use. Your surgeon will determine when it is OK to use it. Your nephrologist will continue to direct your dialysis. You can continue to use your Permcath until your new access is ready for use. ° °If you have any questions, please call the office at [phone]. ° °

## 2022-10-29 ENCOUNTER — Encounter (HOSPITAL_COMMUNITY): Payer: Self-pay | Admitting: Vascular Surgery

## 2022-11-23 ENCOUNTER — Other Ambulatory Visit: Payer: Self-pay

## 2022-11-23 DIAGNOSIS — N186 End stage renal disease: Secondary | ICD-10-CM

## 2022-11-23 DIAGNOSIS — Z992 Dependence on renal dialysis: Secondary | ICD-10-CM

## 2022-12-14 ENCOUNTER — Ambulatory Visit (HOSPITAL_COMMUNITY)
Admission: RE | Admit: 2022-12-14 | Discharge: 2022-12-14 | Disposition: A | Discharge status: Home / Self Care | Payer: Medicare PPO | Source: Ambulatory Visit | Attending: Vascular Surgery | Admitting: Vascular Surgery

## 2022-12-14 ENCOUNTER — Ambulatory Visit (INDEPENDENT_AMBULATORY_CARE_PROVIDER_SITE_OTHER): Payer: Medicare PPO | Admitting: Physician Assistant

## 2022-12-14 VITALS — BP 108/62 | HR 84 | Temp 97.9°F | Resp 20 | Ht 62.0 in | Wt 167.0 lb

## 2022-12-14 DIAGNOSIS — N186 End stage renal disease: Secondary | ICD-10-CM | POA: Diagnosis not present

## 2022-12-14 DIAGNOSIS — Z992 Dependence on renal dialysis: Secondary | ICD-10-CM | POA: Diagnosis present

## 2022-12-14 NOTE — H&P (View-Only) (Signed)
POST OPERATIVE OFFICE NOTE    CC:  F/u for surgery  HPI:  Julie Wells is a 52 y.o. female who is s/p creation of right upper extremity brachiocephalic AV fistula on 10/28/2022 by Dr. Hawken.  She has a history of left brachiobasilic AV fistula and left upper arm AV graft placement in 2022, which have both failed.  Pt returns today for follow up.  She has been doing well since surgery.  She denies any symptoms of steal such as pain in the right hand, excessive coldness, weakness/numbness.  She denies any issues with her incision.  She is still dialyzing on Mondays, Wednesdays, and Saturdays via right IJ TDC.   Allergies  Allergen Reactions   Levofloxacin Anaphylaxis   Azithromycin Hives   Ciprofloxacin Swelling   Other Swelling    Unnamed med caused swelling of the lips (patient cannot recall the indication or name)    Current Outpatient Medications  Medication Sig Dispense Refill   acetaminophen (TYLENOL) 500 MG tablet Take 500-1,000 mg by mouth every 6 (six) hours as needed for mild pain or headache.      albuterol (VENTOLIN HFA) 108 (90 Base) MCG/ACT inhaler Inhale 2 puffs into the lungs every 4 (four) hours as needed for wheezing or shortness of breath. 18 g 0   atorvastatin (LIPITOR) 40 MG tablet Take 40 mg by mouth daily.     B Complex-C-Folic Acid (DIALYVITE TABLET) TABS Take 1 tablet by mouth daily.     carvedilol (COREG) 6.25 MG tablet Take 0.5 tablets (3.125 mg total) by mouth 2 (two) times daily with a meal. 30 tablet 0   Cholecalciferol 50 MCG (2000 UT) CAPS Take 2,000 Units by mouth daily.     cinacalcet (SENSIPAR) 30 MG tablet Take 30 mg by mouth daily.     cyclobenzaprine (FLEXERIL) 5 MG tablet Take 1 tablet (5 mg total) by mouth 3 (three) times daily as needed for muscle spasms. 20 tablet 0   EPINEPHrine 0.3 mg/0.3 mL IJ SOAJ injection Inject 0.3 mg into the muscle as needed for anaphylaxis. 1 each 0   hydroxyurea (HYDREA) 500 MG capsule Take 500 mg by mouth daily. May  take with food to minimize GI side effects.     lidocaine-prilocaine (EMLA) cream Apply 1 Application topically 2 (two) times a week.     NIFEdipine (ADALAT CC) 30 MG 24 hr tablet Take 30 mg by mouth daily.     Oxycodone HCl 10 MG TABS Take 1 tablet (10 mg total) by mouth every 6 (six) hours as needed (pain). 10 tablet 0   patiromer (VELTASSA) 8.4 g packet Take 8.4 g by mouth See admin instructions. Take on Sunday and Tuesday.     predniSONE (DELTASONE) 50 MG tablet 1 tablet PO QD X4 days 4 tablet 0   sevelamer carbonate (RENVELA) 800 MG tablet Take 1,600 mg by mouth 3 (three) times daily with meals.     No current facility-administered medications for this visit.     ROS:  See HPI  Physical Exam:  Incision: Right AC fossa incision well-healed without signs of infection Extremities: palpable right radial pulse.  Right upper arm AV fistula with slight thrill and moderate pulsatility.  Great bruit on auscultation Neuro: Intact motor and sensation of right upper extremity   Studies: R Arm Dialysis Duplex (12/14/2022): +--------------------+----------+-----------------+--------+  AVF                PSV (cm/s)Flow Vol (mL/min)Comments  +--------------------+----------+-----------------+--------+  Native artery inflow     132           328                 +--------------------+----------+-----------------+--------+  AVF Anastomosis        181                               +--------------------+----------+-----------------+--------+     +------------+----------+-------------+----------+--------+  OUTFLOW VEINPSV (cm/s)Diameter (cm)Depth (cm)Describe  +------------+----------+-------------+----------+--------+  Prox UA         90        0.51        0.87             +------------+----------+-------------+----------+--------+  Mid UA         196        0.67        0.76   tortuous  +------------+----------+-------------+----------+--------+  Dist UA          61        0.68        0.52             +------------+----------+-------------+----------+--------+  AC Fossa        94        0.73        1.10             +------------+----------+-------------+----------+--------+        Summary:  Patent arteriovenous fistula.    Assessment/Plan:  This is a 52 y.o. female who is s/p: Creation of right brachiocephalic AV fistula on 10/28/2022   -She has been doing well since surgery.  Her right arm incision has healed without any issues.  -She denies any symptoms of steal such as weakness/numbness in the right hand, excessive coldness, or pain in the right hand.  She has a palpable right radial pulse -Duplex demonstrates a slow to mature fistula with decent size, however poor flow volume at 328 mL/min.  The fistula is also fairly deep in the arm.  On exam the fistula has moderate pulsatility throughout -Since the patient's fistula has been slow to mature and has moderate pulsatility on exam, I believe she would benefit from fistulogram to treat any areas of possible stenosis and aid in maturation.  We will schedule this in a few weeks with Dr. Hawken on a nondialysis day. -For the time being she can continue to dialyze via right IJ TDC   Iqra Rotundo, PA-C [facility] [phone]   Call MD: [place] 

## 2022-12-14 NOTE — Progress Notes (Signed)
POST OPERATIVE OFFICE NOTE    CC:  F/u for surgery  HPI:  Julie Wells is a 52 y.o. female who is s/p creation of right upper extremity brachiocephalic AV fistula on [id number] by Dr. Stanford Breed.  She has a history of left brachiobasilic AV fistula and left upper arm AV graft placement in 2022, which have both failed.  Pt returns today for follow up.  She has been doing well since surgery.  She denies any symptoms of steal such as pain in the right hand, excessive coldness, weakness/numbness.  She denies any issues with her incision.  She is still dialyzing on Mondays, Wednesdays, and Saturdays via right IJ TDC.   Allergies  Allergen Reactions   Levofloxacin Anaphylaxis   Azithromycin Hives   Ciprofloxacin Swelling   Other Swelling    Unnamed med caused swelling of the lips (patient cannot recall the indication or name)    Current Outpatient Medications  Medication Sig Dispense Refill   acetaminophen (TYLENOL) 500 MG tablet Take 500-1,000 mg by mouth every 6 (six) hours as needed for mild pain or headache.      albuterol (VENTOLIN HFA) 108 (90 Base) MCG/ACT inhaler Inhale 2 puffs into the lungs every 4 (four) hours as needed for wheezing or shortness of breath. 18 g 0   atorvastatin (LIPITOR) 40 MG tablet Take 40 mg by mouth daily.     B Complex-C-Folic Acid (DIALYVITE TABLET) TABS Take 1 tablet by mouth daily.     carvedilol (COREG) 6.25 MG tablet Take 0.5 tablets (3.125 mg total) by mouth 2 (two) times daily with a meal. 30 tablet 0   Cholecalciferol 50 MCG (2000 UT) CAPS Take 2,000 Units by mouth daily.     cinacalcet (SENSIPAR) 30 MG tablet Take 30 mg by mouth daily.     cyclobenzaprine (FLEXERIL) 5 MG tablet Take 1 tablet (5 mg total) by mouth 3 (three) times daily as needed for muscle spasms. 20 tablet 0   EPINEPHrine 0.3 mg/0.3 mL IJ SOAJ injection Inject 0.3 mg into the muscle as needed for anaphylaxis. 1 each 0   hydroxyurea (HYDREA) 500 MG capsule Take 500 mg by mouth daily. May  take with food to minimize GI side effects.     lidocaine-prilocaine (EMLA) cream Apply 1 Application topically 2 (two) times a week.     NIFEdipine (ADALAT CC) 30 MG 24 hr tablet Take 30 mg by mouth daily.     Oxycodone HCl 10 MG TABS Take 1 tablet (10 mg total) by mouth every 6 (six) hours as needed (pain). 10 tablet 0   patiromer (VELTASSA) 8.4 g packet Take 8.4 g by mouth See admin instructions. Take on Sunday and Tuesday.     predniSONE (DELTASONE) 50 MG tablet 1 tablet PO QD X4 days 4 tablet 0   sevelamer carbonate (RENVELA) 800 MG tablet Take 1,600 mg by mouth 3 (three) times daily with meals.     No current facility-administered medications for this visit.     ROS:  See HPI  Physical Exam:  Incision: Right AC fossa incision well-healed without signs of infection Extremities: palpable right radial pulse.  Right upper arm AV fistula with slight thrill and moderate pulsatility.  Great bruit on auscultation Neuro: Intact motor and sensation of right upper extremity   Studies: R Arm Dialysis Duplex (12/14/2022): +--------------------+----------+-----------------+--------+  AVF                PSV (cm/s)Flow Vol (mL/min)Comments  +--------------------+----------+-----------------+--------+  Native artery inflow  132           328                 +--------------------+----------+-----------------+--------+  AVF Anastomosis        181                               +--------------------+----------+-----------------+--------+     +------------+----------+-------------+----------+--------+  OUTFLOW VEINPSV (cm/s)Diameter (cm)Depth (cm)Describe  +------------+----------+-------------+----------+--------+  Prox UA         90        0.51        0.87             +------------+----------+-------------+----------+--------+  Mid UA         196        0.67        0.76   tortuous  +------------+----------+-------------+----------+--------+  Dist UA          61        0.68        0.52             +------------+----------+-------------+----------+--------+  AC Fossa        94        0.73        1.10             +------------+----------+-------------+----------+--------+        Summary:  Patent arteriovenous fistula.    Assessment/Plan:  This is a 52 y.o. female who is s/p: Creation of right brachiocephalic AV fistula on 99991111   -She has been doing well since surgery.  Her right arm incision has healed without any issues.  -She denies any symptoms of steal such as weakness/numbness in the right hand, excessive coldness, or pain in the right hand.  She has a palpable right radial pulse -Duplex demonstrates a slow to mature fistula with decent size, however poor flow volume at 328 mL/min.  The fistula is also fairly deep in the arm.  On exam the fistula has moderate pulsatility throughout -Since the patient's fistula has been slow to mature and has moderate pulsatility on exam, I believe she would benefit from fistulogram to treat any areas of possible stenosis and aid in maturation.  We will schedule this in a few weeks with Dr. Stanford Breed on a nondialysis day. -For the time being she can continue to dialyze via right IJ [facility]   Vicente Serene, PA-C [facility] [phone]   Call MD: Trula Slade

## 2022-12-15 ENCOUNTER — Other Ambulatory Visit: Payer: Self-pay | Admitting: *Deleted

## 2022-12-15 DIAGNOSIS — N186 End stage renal disease: Secondary | ICD-10-CM

## 2022-12-15 MED ORDER — SODIUM CHLORIDE 0.9% FLUSH: 3.0000 mL | Freq: Two times a day (BID) | INTRAVENOUS | Status: AC

## 2022-12-15 MED ORDER — SODIUM CHLORIDE 0.9 % IV SOLN: 250.0000 mL | INTRAVENOUS | Status: AC | PRN

## 2022-12-15 MED ORDER — SODIUM CHLORIDE 0.9% FLUSH: 3.0000 mL | INTRAVENOUS | Status: AC | PRN

## 2022-12-24 ENCOUNTER — Ambulatory Visit (HOSPITAL_COMMUNITY)
Admission: RE | Admit: 2022-12-24 | Discharge: 2022-12-24 | Disposition: A | Discharge status: Home / Self Care | Payer: Medicare PPO | Attending: Vascular Surgery | Admitting: Vascular Surgery

## 2022-12-24 ENCOUNTER — Encounter (HOSPITAL_COMMUNITY)
Admission: RE | Disposition: A | Discharge status: Home / Self Care | Payer: Self-pay | Source: Home / Self Care | Attending: Vascular Surgery

## 2022-12-24 ENCOUNTER — Other Ambulatory Visit: Payer: Self-pay

## 2022-12-24 DIAGNOSIS — T82898A Other specified complication of vascular prosthetic devices, implants and grafts, initial encounter: Secondary | ICD-10-CM

## 2022-12-24 DIAGNOSIS — N186 End stage renal disease: Secondary | ICD-10-CM | POA: Insufficient documentation

## 2022-12-24 DIAGNOSIS — N185 Chronic kidney disease, stage 5: Secondary | ICD-10-CM | POA: Diagnosis not present

## 2022-12-24 DIAGNOSIS — Z992 Dependence on renal dialysis: Secondary | ICD-10-CM | POA: Insufficient documentation

## 2022-12-24 HISTORY — PX: A/V FISTULAGRAM: CATH118298

## 2022-12-24 HISTORY — PX: PERIPHERAL VASCULAR INTERVENTION: CATH118257

## 2022-12-24 LAB — POCT I-STAT, CHEM 8
BUN: 31 mg/dL — ABNORMAL HIGH (ref 6–20)
Calcium, Ion: 1.08 mmol/L — ABNORMAL LOW (ref 1.15–1.40)
Chloride: 100 mmol/L (ref 98–111)
Creatinine, Ser: 9.7 mg/dL — ABNORMAL HIGH (ref 0.44–1.00)
Glucose, Bld: 93 mg/dL (ref 70–99)
HCT: 25 % — ABNORMAL LOW (ref 36.0–46.0)
Hemoglobin: 8.5 g/dL — ABNORMAL LOW (ref 12.0–15.0)
Potassium: 4.3 mmol/L (ref 3.5–5.1)
Sodium: 142 mmol/L (ref 135–145)
TCO2: 30 mmol/L (ref 22–32)

## 2022-12-24 SURGERY — A/V FISTULAGRAM
Anesthesia: LOCAL | Laterality: Right

## 2022-12-24 MED ORDER — MIDAZOLAM HCL 2 MG/2ML IJ SOLN
INTRAMUSCULAR | Status: DC | PRN
  Administered 2022-12-24: 1 mg via INTRAVENOUS

## 2022-12-24 MED ORDER — HEPARIN (PORCINE) IN NACL 1000-0.9 UT/500ML-% IV SOLN
INTRAVENOUS | Status: DC | PRN
  Administered 2022-12-24: 500 mL

## 2022-12-24 MED ORDER — MIDAZOLAM HCL 2 MG/2ML IJ SOLN
INTRAMUSCULAR | Status: AC
  Filled 2022-12-24: qty 2

## 2022-12-24 MED ORDER — FENTANYL CITRATE (PF) 100 MCG/2ML IJ SOLN
INTRAMUSCULAR | Status: DC | PRN
  Administered 2022-12-24: 25 ug via INTRAVENOUS
  Administered 2022-12-24: 50 ug via INTRAVENOUS

## 2022-12-24 MED ORDER — FENTANYL CITRATE (PF) 100 MCG/2ML IJ SOLN
INTRAMUSCULAR | Status: AC
  Filled 2022-12-24: qty 2

## 2022-12-24 MED ORDER — LIDOCAINE HCL (PF) 1 % IJ SOLN
INTRAMUSCULAR | Status: DC | PRN
  Administered 2022-12-24: 5 mL

## 2022-12-24 MED ORDER — IODIXANOL 320 MG/ML IV SOLN
INTRAVENOUS | Status: DC | PRN
  Administered 2022-12-24: 20 mL

## 2022-12-24 MED ORDER — ACETAMINOPHEN 325 MG PO TABS: 650.0000 mg | ORAL_TABLET | Freq: Once | ORAL | Status: AC

## 2022-12-24 MED ORDER — LIDOCAINE HCL (PF) 1 % IJ SOLN
INTRAMUSCULAR | Status: AC
  Filled 2022-12-24: qty 30

## 2022-12-24 MED ORDER — ACETAMINOPHEN 325 MG PO TABS
ORAL_TABLET | ORAL | Status: AC
  Administered 2022-12-24: 650 mg
  Filled 2022-12-24: qty 2

## 2022-12-24 SURGICAL SUPPLY — 17 items
BALLN MUSTANG 6.0X20 75 (BALLOONS) ×1
BALLOON MUSTANG 6.0X20 75 (BALLOONS) IMPLANT
COVER DOME SNAP 22 D (MISCELLANEOUS) ×1 IMPLANT
GUIDEWIRE ANGLED .035X150CM (WIRE) IMPLANT
KIT ENCORE 26 ADVANTAGE (KITS) IMPLANT
KIT MICROPUNCTURE NIT STIFF (SHEATH) IMPLANT
PROTECTION STATION PRESSURIZED (MISCELLANEOUS) ×1
SHEATH PINNACLE R/O II 6F 4CM (SHEATH) IMPLANT
SHEATH PINNACLE R/O II 7F 4CM (SHEATH) IMPLANT
STATION PROTECTION PRESSURIZED (MISCELLANEOUS) ×1 IMPLANT
STENT VIABAHN 7X50X120 (Permanent Stent) ×1 IMPLANT
STENT VIABAHN 7X5X120 7FR (Permanent Stent) IMPLANT
STOPCOCK MORSE 400PSI 3WAY (MISCELLANEOUS) ×1 IMPLANT
TRAY PV CATH (CUSTOM PROCEDURE TRAY) ×1 IMPLANT
TUBING CIL FLEX 10 FLL-RA (TUBING) ×1 IMPLANT
WIRE BENTSON .035X145CM (WIRE) IMPLANT
WIRE SHEPHERD 4G .018 (WIRE) IMPLANT

## 2022-12-24 NOTE — Interval H&P Note (Signed)
History and Physical Interval Note:  12/24/2022 2:25 PM  Julie Wells  has presented today for surgery, with the diagnosis of instage reanl.  The various methods of treatment have been discussed with the patient and family. After consideration of risks, benefits and other options for treatment, the patient has consented to  Procedure(s): A/V Fistulagram (Right) as a surgical intervention.  The patient's history has been reviewed, patient examined, no change in status, stable for surgery.  I have reviewed the patient's chart and labs.  Questions were answered to the patient's satisfaction.     [name]

## 2022-12-24 NOTE — Op Note (Addendum)
DATE OF SERVICE: 12/24/2022  PATIENT:  Julie Wells  52 y.o. female  PRE-OPERATIVE DIAGNOSIS:  ESRD  POST-OPERATIVE DIAGNOSIS:  Same  PROCEDURE:   1) right arm fistulagram 2) angioplasty and stenting of right brachiocephalic arteriovenous fistula (7x59mm Viabahn) 3) conscious sedation (21 minutes)  SURGEON:  Surgeon(s) and Role:    * [name], MD - Primary  ASSISTANT: none  ANESTHESIA:   local and IV sedation  EBL: minimal  BLOOD ADMINISTERED:none  DRAINS: none   LOCAL MEDICATIONS USED:  LIDOCAINE   SPECIMEN:  none  COUNTS: confirmed correct.  TOURNIQUET:  none  PATIENT DISPOSITION:  PACU - hemodynamically stable.   Delay start of Pharmacological VTE agent (>24hrs) due to surgical blood loss or risk of bleeding: no  INDICATION FOR PROCEDURE: Julie Wells is a 52 y.o. female with end-stage renal disease. I created a right brachiocephalic fistula for her 10/28/2022.  This is been slow to mature.. After careful discussion of risks, benefits, and alternatives the patient was offered fistulogram. The patient understood and wished to proceed.  OPERATIVE FINDINGS: Critical stenosis in the peripheral fistula near the anastomosis.  This did not respond to balloon angioplasty.  A 7 x 50 mm Viabahn stent was placed across the lesion and deployed in standard fashion.  Good result was achieved with much improved flow through the fistula.  DESCRIPTION OF PROCEDURE: After identification of the patient in the pre-operative holding area, the patient was transferred to the operating room. The patient was positioned supine on the operating room table. Anesthesia was induced. The right arm was prepped and draped in standard fashion. A surgical pause was performed confirming correct patient, procedure, and operative location.  The puncture access was obtained in the peripheral right arm fistula.  Micro sheath was introduced in the fistula.  Fistulogram was performed in stations.   Critical stenosis was noted in the peripheral fistula.  Access was upsized to 6 [place].  A Glidewire was navigated across the lesion.  The lesion was angioplastied.  No response angioplasty was noted on follow-up angiogram.  I elected to stent the lesion.  A 7 x 50 mm Viabahn stent was placed across the lesion and deployed in standard fashion.  I then angioplastied the lesion.  Good technical result was achieved with much improved flow through the fistula.  A smooth thrill was noted in the proximal fistula.  All endovascular equipment was removed.  A figure-of-eight stitch was placed around the access.  Conscious sedation was administered with the use of IV fentanyl and midazolam under continuous physician and nurse monitoring.  Heart rate, blood pressure, and oxygen saturation were continuously monitored.  Total sedation time was 21 minutes  Upon completion of the case instrument and sharps counts were confirmed correct. The patient was transferred to the  PACU in good condition. I was present for all portions of the procedure.  Rande Brunt. Lenell Antu, MD [facility] Phone Number: [phone] 12/24/2022 2:26 PM

## 2022-12-27 ENCOUNTER — Encounter (HOSPITAL_COMMUNITY): Payer: Self-pay | Admitting: Vascular Surgery

## 2023-01-11 ENCOUNTER — Encounter (HOSPITAL_COMMUNITY): Payer: Medicare PPO

## 2023-01-11 ENCOUNTER — Encounter: Payer: Medicare PPO | Admitting: Vascular Surgery

## 2023-01-18 ENCOUNTER — Other Ambulatory Visit: Payer: Self-pay | Admitting: *Deleted

## 2023-01-18 DIAGNOSIS — N186 End stage renal disease: Secondary | ICD-10-CM

## 2023-01-20 ENCOUNTER — Telehealth: Payer: Self-pay | Admitting: Vascular Surgery

## 2023-01-25 ENCOUNTER — Ambulatory Visit (HOSPITAL_COMMUNITY)
Admission: RE | Admit: 2023-01-25 | Discharge: 2023-01-25 | Disposition: A | Discharge status: Home / Self Care | Payer: Medicare PPO | Source: Ambulatory Visit | Attending: Vascular Surgery | Admitting: Vascular Surgery

## 2023-01-25 ENCOUNTER — Encounter: Payer: Medicare PPO | Admitting: Vascular Surgery

## 2023-01-25 DIAGNOSIS — N186 End stage renal disease: Secondary | ICD-10-CM | POA: Diagnosis not present

## 2023-01-25 DIAGNOSIS — Z992 Dependence on renal dialysis: Secondary | ICD-10-CM | POA: Diagnosis not present

## 2023-02-01 NOTE — Telephone Encounter (Signed)
Appt has been rescheduled

## 2023-02-15 ENCOUNTER — Ambulatory Visit (INDEPENDENT_AMBULATORY_CARE_PROVIDER_SITE_OTHER): Payer: Medicare PPO | Admitting: Physician Assistant

## 2023-02-15 VITALS — BP 102/62 | HR 87 | Temp 98.2°F | Resp 14 | Ht 62.0 in | Wt 163.6 lb

## 2023-02-15 DIAGNOSIS — N186 End stage renal disease: Secondary | ICD-10-CM | POA: Diagnosis not present

## 2023-02-15 DIAGNOSIS — Z992 Dependence on renal dialysis: Secondary | ICD-10-CM

## 2023-02-15 NOTE — Progress Notes (Signed)
Access Visit   History of Present Illness   Julie Wells is a 52 y.o. year old female who presents for postoperative follow-up for:  1) right arm fistulogram 2) angioplasty and stenting of right brachiocephalic arteriovenous fistula (7x60mm Viabahn) by Dr. Lenell Antu on 12/24/22. This was performed due to slowly maturing AV fistula. The patient notes no steal symptoms. Her right Brachiocephalic AV fistula was created on 10/28/22. She has prior history of a failed left Brachiobasilic AV fistula and left upper arm graft.   She currently dialyzes via a right IJ TDC on MWF at the [place] location in [place].   Physical Examination   Vitals:   02/15/23 1414  BP: 102/62  Pulse: 87  Resp: 14  Temp: 98.2 F (36.8 C)  TempSrc: Temporal  SpO2: 98%  Weight: 163 lb 9.6 oz (74.2 kg)  Height: 5\' 2"  (1.575 m)   Body mass index is 29.92 kg/m.  right arm 2+ radial pulse, hand grip is 5/5, sensation in digits is intact,palpable thrill, bruit can be auscultated    Non Invasive Vascular lab: 01/25/23 Findings:  +--------------------+----------+-----------------+--------+  AVF                PSV (cm/s)Flow Vol (mL/min)Comments  +--------------------+----------+-----------------+--------+  Native artery inflow   216          1704                 +--------------------+----------+-----------------+--------+  AVF Anastomosis        509                               +--------------------+----------+-----------------+--------+     +------------+----------+-------------+----------+--------------+  OUTFLOW VEINPSV (cm/s)Diameter (cm)Depth (cm)   Describe     +------------+----------+-------------+----------+--------------+  Shoulder      169        0.72        0.99                   +------------+----------+-------------+----------+--------------+  Prox UA        158        0.74        1.00                    +------------+----------+-------------+----------+--------------+  Mid UA         230        0.77        0.45       stent       +------------+----------+-------------+----------+--------------+  Dist UA        118        0.86        0.33                   +------------+----------+-------------+----------+--------------+  AC Fossa       246        0.75        0.31   Retained valve  +------------+----------+-------------+----------+--------------+     Stent inflow 277 cm/s , proximal 230 cm/s , Mid 260 cm/s, stent end 234 cm/s, stent out 118 cm/s     Summary:  Patent arteriovenous fistula with patent stent   Medical Decision Making   Julie Wells is a 52 y.o. year old female who presents s/p  1) right arm fistulogram 2) angioplasty and stenting of right brachiocephalic arteriovenous fistula (7x58mm Viabahn) by Dr. Lenell Antu on 12/24/22. Patent is  without signs or symptoms of steal syndrome. Her duplex on 01/25/23 shows patent AV fistula with patent stent in the right [facility] fistula Her right IJ TDC can be scheduled to be removed at the discretion of the dialysis center when they feel that her right BC fistula is functioning well. Her TDC was placed by IR so they can be contacted regarding removal She can follow up as needed if she has any new or concerning symptoms   Graceann Congress, PA-C [facility] [place] Office: [phone]  Clinic MD: Steve Rattler

## 2024-03-02 ENCOUNTER — Other Ambulatory Visit: Payer: Self-pay

## 2024-03-02 ENCOUNTER — Encounter (HOSPITAL_BASED_OUTPATIENT_CLINIC_OR_DEPARTMENT_OTHER): Payer: Self-pay

## 2024-03-02 ENCOUNTER — Emergency Department (HOSPITAL_BASED_OUTPATIENT_CLINIC_OR_DEPARTMENT_OTHER)
Admission: EM | Admit: 2024-03-02 | Discharge: 2024-03-02 | Disposition: A | Discharge status: Home / Self Care | Attending: Emergency Medicine | Admitting: Emergency Medicine

## 2024-03-02 DIAGNOSIS — K122 Cellulitis and abscess of mouth: Secondary | ICD-10-CM | POA: Diagnosis not present

## 2024-03-02 DIAGNOSIS — R070 Pain in throat: Secondary | ICD-10-CM | POA: Diagnosis present

## 2024-03-02 MED ORDER — PREDNISONE 50 MG PO TABS
50.0000 mg | ORAL_TABLET | Freq: Once | ORAL | Status: AC
  Filled 2024-03-02: qty 1

## 2024-03-02 MED ORDER — PREDNISONE 20 MG PO TABS: ORAL_TABLET | ORAL | 0 refills | Status: AC

## 2024-03-02 MED ORDER — AMOXICILLIN 500 MG PO CAPS: 1000.0000 mg | ORAL_CAPSULE | Freq: Two times a day (BID) | ORAL | 0 refills | Status: AC

## 2024-03-02 MED ORDER — AMOXICILLIN 500 MG PO CAPS
1000.0000 mg | ORAL_CAPSULE | Freq: Once | ORAL | Status: AC
  Filled 2024-03-02: qty 2

## 2024-03-02 NOTE — Discharge Instructions (Addendum)
 Follow up with your family doc/transplant doc in the office.  Call them today to let them know about your visit here. Return for worsening difficulty breathing or swallowing.

## 2024-03-02 NOTE — ED Provider Notes (Signed)
 [place] [facility] Provider Note   CSN: [id number] Arrival date & time: 03/02/24  3086     Patient presents with: throat pain   Julie Wells is a 53 y.o. female.   53 yo F with a chief complaint of throat pain and swelling.  This started this morning.  No issues yesterday.  Denies cough congestion or fever.  No known sick contacts.        Prior to Admission medications   Medication Sig Start Date End Date Taking? Authorizing Provider  amoxicillin (AMOXIL) 500 MG capsule Take 2 capsules (1,000 mg total) by mouth 2 (two) times daily. 03/02/24  Yes Albertus Hughs, DO  mycophenolate (CELLCEPT) 500 MG tablet Take 1,000 mg by mouth 2 (two) times daily.   Yes [provider]  predniSONE  (DELTASONE ) 20 MG tablet 3 tabs po daily x 4 days 03/02/24  Yes Brian Zeitlin, DO  sulfamethoxazole-trimethoprim (BACTRIM DS) 800-160 MG tablet Take 1 tablet by mouth daily.   Yes [provider]  tacrolimus (PROGRAF) 1 MG capsule Take 11 mg by mouth 2 (two) times daily.   Yes [provider]  valGANciclovir (VALCYTE) 450 MG tablet Take 450 mg by mouth daily.   Yes [provider]  acetaminophen  (TYLENOL ) 500 MG tablet Take 500-1,000 mg by mouth every 6 (six) hours as needed for mild pain or headache.     [provider]  albuterol  (VENTOLIN  HFA) 108 (90 Base) MCG/ACT inhaler Inhale 2 puffs into the lungs every 4 (four) hours as needed for wheezing or shortness of breath. Patient not taking: Reported on 12/21/2022 10/13/22   [name], MD  atorvastatin  (LIPITOR) 40 MG tablet Take 40 mg by mouth daily. 07/03/20   [provider]  B Complex-C-Folic Acid  (DIALYVITE TABLET) TABS Take 1 tablet by mouth daily. 09/22/20   [provider]  calcitRIOL (ROCALTROL) 0.5 MCG capsule Take 0.5 mcg by mouth daily.    [provider]  carvedilol  (COREG ) 25 MG tablet Take 25 mg by mouth 2 (two) times daily with a meal.     [provider]  carvedilol  (COREG ) 6.25 MG tablet Take 0.5 tablets (3.125 mg total) by mouth 2 (two) times daily with a meal. Patient not taking: Reported on 12/21/2022 10/13/22   [name], MD  cinacalcet  (SENSIPAR ) 30 MG tablet Take 30 mg by mouth daily.    [provider]  cyclobenzaprine  (FLEXERIL ) 5 MG tablet Take 1 tablet (5 mg total) by mouth 3 (three) times daily as needed for muscle spasms. Patient not taking: Reported on 12/21/2022 10/13/22   [name], MD  EPINEPHrine  0.3 mg/0.3 mL IJ SOAJ injection Inject 0.3 mg into the muscle as needed for anaphylaxis. 10/15/22   [name], MD  hydroxyurea  (HYDREA ) 500 MG capsule Take 500-1,000 mg by mouth See admin instructions. Take 500 mg daily on Tues, Thurs, Sat, and Sun and take 1000 mg on Mon,Wed, and Fri    [provider]  NIFEdipine  (ADALAT  CC) 30 MG 24 hr tablet Take 30 mg by mouth daily.    [provider]  Oxycodone  HCl 10 MG TABS Take 1 tablet (10 mg total) by mouth every 6 (six) hours as needed (pain). 10/28/22   Cordie Deters, PA-C  patiromer  (VELTASSA ) 8.4 g packet Take 8.4 g by mouth See admin instructions. Take on Sunday and Tuesday. 10/19/21   [provider]  predniSONE  (DELTASONE ) 50 MG tablet 1 tablet PO QD  X4 days Patient not taking: Reported on 12/21/2022 10/15/22   [name], MD  sevelamer  carbonate (RENVELA ) 800 MG tablet Take 800 mg by mouth 3 (three) times daily with meals. 08/27/20   [provider]    Allergies: Levofloxacin, Azithromycin , Ciprofloxacin, and Other    Review of Systems  Updated Vital Signs BP (!) 144/74 (BP Location: Right Arm)   Pulse 75   Temp 97.7 F (36.5 C) (Oral)   Resp 18   Ht 5' 2 (1.575 m)   Wt 68 kg   LMP 09/14/2022 (Approximate)   SpO2 98%   BMI 27.44 kg/m   Physical Exam Vitals and nursing note reviewed.  Constitutional:      General: She is not in acute distress.    Appearance: She is  well-developed. She is not diaphoretic.  HENT:     Head: Normocephalic and atraumatic.     Mouth/Throat:     Comments: Uvula is edematous.  No obvious other posterior oropharyngeal erythema or edema.  Tonsils are not visualized.  No anterior cervical lymphadenopathy.  No sublingual swelling.  Tolerating secretions without difficulty.  Able to rotate her head without pain.  Eyes:     Pupils: Pupils are equal, round, and reactive to light.    Cardiovascular:     Rate and Rhythm: Normal rate and regular rhythm.     Heart sounds: No murmur heard.    No friction rub. No gallop.  Pulmonary:     Effort: Pulmonary effort is normal.     Breath sounds: No wheezing or rales.  Abdominal:     General: There is no distension.     Palpations: Abdomen is soft.     Tenderness: There is no abdominal tenderness.   Musculoskeletal:        General: No tenderness.     Cervical back: Normal range of motion and neck supple.   Skin:    General: Skin is warm and dry.   Neurological:     Mental Status: She is alert and oriented to person, place, and time.   Psychiatric:        Behavior: Behavior normal.     (all labs ordered are listed, but only abnormal results are displayed) Labs Reviewed - No data to display  EKG: None  Radiology: No results found.   Procedures   Medications Ordered in the ED  predniSONE  (DELTASONE ) tablet 50 mg (50 mg Oral Given 03/02/24 0854)  amoxicillin (AMOXIL) capsule 1,000 mg (1,000 mg Oral Given 03/02/24 0854)                                    Medical Decision Making Risk Prescription drug management.   53 yo F with a chief complaints of a sore throat.  Woke up with it this morning.  No symptoms yesterday.  Clinically patient is uvulitis.  On record review the patient has a history of renal transplants and is on immunosuppression.  Will start on antibiotics for possible bacterial cause.  Increase home dose of steroids temporarily.  PCP and transplant  follow-up.  9:21 AM:  I have discussed the diagnosis/risks/treatment options with the patient and family.  Evaluation and diagnostic testing in the emergency department does not suggest an emergent condition requiring admission or immediate intervention beyond what has been performed at this time.  They will follow up with PCP, transplant. We also discussed returning to the ED immediately  if new or worsening sx occur. We discussed the sx which are most concerning (e.g., sudden worsening pain, fever, inability to tolerate by mouth) that necessitate immediate return. Medications administered to the patient during their visit and any new prescriptions provided to the patient are listed below.  Medications given during this visit Medications  predniSONE  (DELTASONE ) tablet 50 mg (50 mg Oral Given 03/02/24 0854)  amoxicillin (AMOXIL) capsule 1,000 mg (1,000 mg Oral Given 03/02/24 0854)     The patient appears reasonably screen and/or stabilized for discharge and I doubt any other medical condition or other [facility] requiring further screening, evaluation, or treatment in the ED at this time prior to discharge.       Final diagnoses:  Uvulitis    ED Discharge Orders          Ordered    predniSONE  (DELTASONE ) 20 MG tablet        03/02/24 0840    amoxicillin (AMOXIL) 500 MG capsule  2 times daily        03/02/24 0840               Shameeka Silliman, DO 03/02/24 [phone]

## 2024-03-02 NOTE — ED Triage Notes (Signed)
 Pt reports that this morning she noted that her throat was sore and swollen. States that it hurts to swallow.
